# Patient Record
Sex: Female | Born: 1970 | Race: White | Hispanic: No | Smoking: Never smoker
Health system: Southern US, Community
[De-identification: ages and names within clinical notes are randomized; demographics above are authoritative.]

## PROBLEM LIST (undated history)

## (undated) DIAGNOSIS — N801 Endometriosis of ovary: Secondary | ICD-10-CM

## (undated) DIAGNOSIS — Z0000 Encounter for general adult medical examination without abnormal findings: Secondary | ICD-10-CM

## (undated) DIAGNOSIS — S0285XA Fracture of orbit, unspecified, initial encounter for closed fracture: Secondary | ICD-10-CM

## (undated) DIAGNOSIS — M25579 Pain in unspecified ankle and joints of unspecified foot: Secondary | ICD-10-CM

## (undated) DIAGNOSIS — T8859XA Other complications of anesthesia, initial encounter: Secondary | ICD-10-CM

## (undated) DIAGNOSIS — Z9889 Other specified postprocedural states: Secondary | ICD-10-CM

## (undated) DIAGNOSIS — A749 Chlamydial infection, unspecified: Secondary | ICD-10-CM

## (undated) DIAGNOSIS — M549 Dorsalgia, unspecified: Secondary | ICD-10-CM

## (undated) DIAGNOSIS — K5900 Constipation, unspecified: Secondary | ICD-10-CM

## (undated) DIAGNOSIS — R5383 Other fatigue: Secondary | ICD-10-CM

## (undated) DIAGNOSIS — F988 Other specified behavioral and emotional disorders with onset usually occurring in childhood and adolescence: Secondary | ICD-10-CM

## (undated) DIAGNOSIS — A048 Other specified bacterial intestinal infections: Secondary | ICD-10-CM

## (undated) DIAGNOSIS — T7840XA Allergy, unspecified, initial encounter: Secondary | ICD-10-CM

## (undated) DIAGNOSIS — Q244 Congenital subaortic stenosis: Secondary | ICD-10-CM

## (undated) DIAGNOSIS — R5382 Chronic fatigue, unspecified: Secondary | ICD-10-CM

## (undated) DIAGNOSIS — R600 Localized edema: Secondary | ICD-10-CM

## (undated) DIAGNOSIS — G9332 Myalgic encephalomyelitis/chronic fatigue syndrome: Secondary | ICD-10-CM

## (undated) DIAGNOSIS — E739 Lactose intolerance, unspecified: Secondary | ICD-10-CM

## (undated) DIAGNOSIS — C50919 Malignant neoplasm of unspecified site of unspecified female breast: Secondary | ICD-10-CM

## (undated) DIAGNOSIS — T4145XA Adverse effect of unspecified anesthetic, initial encounter: Secondary | ICD-10-CM

## (undated) DIAGNOSIS — R350 Frequency of micturition: Secondary | ICD-10-CM

## (undated) DIAGNOSIS — E785 Hyperlipidemia, unspecified: Secondary | ICD-10-CM

## (undated) DIAGNOSIS — K760 Fatty (change of) liver, not elsewhere classified: Secondary | ICD-10-CM

## (undated) DIAGNOSIS — I351 Nonrheumatic aortic (valve) insufficiency: Secondary | ICD-10-CM

## (undated) DIAGNOSIS — F32A Depression, unspecified: Secondary | ICD-10-CM

## (undated) DIAGNOSIS — N289 Disorder of kidney and ureter, unspecified: Secondary | ICD-10-CM

## (undated) DIAGNOSIS — I493 Ventricular premature depolarization: Secondary | ICD-10-CM

## (undated) DIAGNOSIS — K589 Irritable bowel syndrome without diarrhea: Secondary | ICD-10-CM

## (undated) DIAGNOSIS — M542 Cervicalgia: Secondary | ICD-10-CM

## (undated) DIAGNOSIS — K5909 Other constipation: Secondary | ICD-10-CM

## (undated) DIAGNOSIS — F419 Anxiety disorder, unspecified: Secondary | ICD-10-CM

## (undated) DIAGNOSIS — R112 Nausea with vomiting, unspecified: Secondary | ICD-10-CM

## (undated) DIAGNOSIS — T7492XA Unspecified child maltreatment, confirmed, initial encounter: Secondary | ICD-10-CM

## (undated) DIAGNOSIS — F329 Major depressive disorder, single episode, unspecified: Secondary | ICD-10-CM

## (undated) DIAGNOSIS — K219 Gastro-esophageal reflux disease without esophagitis: Secondary | ICD-10-CM

## (undated) HISTORY — DX: Unspecified child maltreatment, confirmed, initial encounter: T74.92XA

## (undated) HISTORY — DX: Irritable bowel syndrome without diarrhea: K58.9

## (undated) HISTORY — DX: Fatty (change of) liver, not elsewhere classified: K76.0

## (undated) HISTORY — DX: Constipation, unspecified: K59.00

## (undated) HISTORY — DX: Myalgic encephalomyelitis/chronic fatigue syndrome: G93.32

## (undated) HISTORY — DX: Morbid (severe) obesity due to excess calories: E66.01

## (undated) HISTORY — DX: Dorsalgia, unspecified: M54.9

## (undated) HISTORY — DX: Pain in unspecified ankle and joints of unspecified foot: M25.579

## (undated) HISTORY — DX: Hyperlipidemia, unspecified: E78.5

## (undated) HISTORY — DX: Allergy, unspecified, initial encounter: T78.40XA

## (undated) HISTORY — DX: Disorder of kidney and ureter, unspecified: N28.9

## (undated) HISTORY — DX: Endometriosis of ovary: N80.1

## (undated) HISTORY — DX: Lactose intolerance, unspecified: E73.9

## (undated) HISTORY — DX: Frequency of micturition: R35.0

## (undated) HISTORY — DX: Ventricular premature depolarization: I49.3

## (undated) HISTORY — DX: Nonrheumatic aortic (valve) insufficiency: I35.1

## (undated) HISTORY — DX: Localized edema: R60.0

## (undated) HISTORY — DX: Fracture of orbit, unspecified, initial encounter for closed fracture: S02.85XA

## (undated) HISTORY — DX: Cervicalgia: M54.2

## (undated) HISTORY — DX: Gastro-esophageal reflux disease without esophagitis: K21.9

## (undated) HISTORY — DX: Other specified bacterial intestinal infections: A04.8

## (undated) HISTORY — DX: Encounter for general adult medical examination without abnormal findings: Z00.00

## (undated) HISTORY — DX: Congenital subaortic stenosis: Q24.4

## (undated) HISTORY — DX: Chlamydial infection, unspecified: A74.9

## (undated) HISTORY — PX: EXTERNAL EAR SURGERY: SHX627

## (undated) HISTORY — DX: Other specified behavioral and emotional disorders with onset usually occurring in childhood and adolescence: F98.8

## (undated) HISTORY — DX: Chronic fatigue, unspecified: R53.82

## (undated) HISTORY — DX: Malignant neoplasm of unspecified site of unspecified female breast: C50.919

---

## 1999-03-15 HISTORY — PX: SUBAORTIC STENOSIS REPAIR: SHX2450

## 1999-09-23 ENCOUNTER — Ambulatory Visit (HOSPITAL_COMMUNITY): Admission: RE | Admit: 1999-09-23 | Discharge: 1999-09-23 | Payer: Self-pay | Admitting: *Deleted

## 1999-11-23 ENCOUNTER — Encounter: Payer: Self-pay | Admitting: *Deleted

## 1999-11-23 ENCOUNTER — Emergency Department (HOSPITAL_COMMUNITY): Admission: EM | Admit: 1999-11-23 | Discharge: 1999-11-23 | Payer: Self-pay | Admitting: *Deleted

## 2000-08-16 ENCOUNTER — Encounter: Payer: Self-pay | Admitting: Family Medicine

## 2000-08-16 ENCOUNTER — Ambulatory Visit (HOSPITAL_COMMUNITY): Admission: RE | Admit: 2000-08-16 | Discharge: 2000-08-16 | Payer: Self-pay | Admitting: Family Medicine

## 2000-10-18 ENCOUNTER — Other Ambulatory Visit: Admission: RE | Admit: 2000-10-18 | Discharge: 2000-10-18 | Payer: Self-pay | Admitting: Gynecology

## 2001-03-14 HISTORY — PX: APPENDECTOMY: SHX54

## 2001-06-06 ENCOUNTER — Ambulatory Visit (HOSPITAL_COMMUNITY): Admission: RE | Admit: 2001-06-06 | Discharge: 2001-06-06 | Payer: Self-pay | Admitting: Family Medicine

## 2001-06-06 ENCOUNTER — Encounter: Payer: Self-pay | Admitting: Family Medicine

## 2001-10-24 ENCOUNTER — Other Ambulatory Visit: Admission: RE | Admit: 2001-10-24 | Discharge: 2001-10-24 | Payer: Self-pay | Admitting: Gynecology

## 2002-02-18 ENCOUNTER — Emergency Department (HOSPITAL_COMMUNITY): Admission: EM | Admit: 2002-02-18 | Discharge: 2002-02-18 | Payer: Self-pay | Admitting: Emergency Medicine

## 2002-03-14 DIAGNOSIS — A749 Chlamydial infection, unspecified: Secondary | ICD-10-CM

## 2002-03-14 HISTORY — DX: Chlamydial infection, unspecified: A74.9

## 2002-09-06 ENCOUNTER — Encounter: Payer: Self-pay | Admitting: General Surgery

## 2002-09-06 ENCOUNTER — Encounter (INDEPENDENT_AMBULATORY_CARE_PROVIDER_SITE_OTHER): Payer: Self-pay | Admitting: Specialist

## 2002-09-06 ENCOUNTER — Encounter: Payer: Self-pay | Admitting: Emergency Medicine

## 2002-09-06 ENCOUNTER — Inpatient Hospital Stay (HOSPITAL_COMMUNITY): Admission: EM | Admit: 2002-09-06 | Discharge: 2002-09-07 | Payer: Self-pay | Admitting: Emergency Medicine

## 2002-11-06 ENCOUNTER — Other Ambulatory Visit: Admission: RE | Admit: 2002-11-06 | Discharge: 2002-11-06 | Payer: Self-pay | Admitting: Gynecology

## 2003-03-15 HISTORY — PX: BREAST LUMPECTOMY: SHX2

## 2003-06-18 ENCOUNTER — Encounter (INDEPENDENT_AMBULATORY_CARE_PROVIDER_SITE_OTHER): Payer: Self-pay | Admitting: *Deleted

## 2003-06-18 ENCOUNTER — Encounter: Admission: RE | Admit: 2003-06-18 | Discharge: 2003-06-18 | Payer: Self-pay | Admitting: Surgery

## 2003-06-23 ENCOUNTER — Encounter (HOSPITAL_COMMUNITY): Admission: RE | Admit: 2003-06-23 | Discharge: 2003-09-21 | Payer: Self-pay | Admitting: Surgery

## 2003-07-01 ENCOUNTER — Encounter: Admission: RE | Admit: 2003-07-01 | Discharge: 2003-07-01 | Payer: Self-pay | Admitting: Surgery

## 2003-07-02 ENCOUNTER — Encounter (INDEPENDENT_AMBULATORY_CARE_PROVIDER_SITE_OTHER): Payer: Self-pay | Admitting: Specialist

## 2003-07-02 ENCOUNTER — Encounter: Admission: RE | Admit: 2003-07-02 | Discharge: 2003-07-02 | Payer: Self-pay | Admitting: Surgery

## 2003-07-02 ENCOUNTER — Ambulatory Visit (HOSPITAL_BASED_OUTPATIENT_CLINIC_OR_DEPARTMENT_OTHER): Admission: RE | Admit: 2003-07-02 | Discharge: 2003-07-02 | Payer: Self-pay | Admitting: Surgery

## 2003-07-02 ENCOUNTER — Ambulatory Visit (HOSPITAL_COMMUNITY): Admission: RE | Admit: 2003-07-02 | Discharge: 2003-07-02 | Payer: Self-pay | Admitting: Surgery

## 2003-07-28 ENCOUNTER — Ambulatory Visit (HOSPITAL_COMMUNITY): Admission: RE | Admit: 2003-07-28 | Discharge: 2003-07-28 | Payer: Self-pay | Admitting: Oncology

## 2003-07-30 ENCOUNTER — Ambulatory Visit (HOSPITAL_COMMUNITY): Admission: RE | Admit: 2003-07-30 | Discharge: 2003-07-30 | Payer: Self-pay | Admitting: Oncology

## 2003-08-04 ENCOUNTER — Ambulatory Visit (HOSPITAL_COMMUNITY): Admission: RE | Admit: 2003-08-04 | Discharge: 2003-08-04 | Payer: Self-pay | Admitting: Oncology

## 2003-08-05 ENCOUNTER — Ambulatory Visit: Admission: RE | Admit: 2003-08-05 | Discharge: 2003-09-24 | Payer: Self-pay | Admitting: *Deleted

## 2003-08-07 ENCOUNTER — Ambulatory Visit (HOSPITAL_BASED_OUTPATIENT_CLINIC_OR_DEPARTMENT_OTHER): Admission: RE | Admit: 2003-08-07 | Discharge: 2003-08-07 | Payer: Self-pay | Admitting: Surgery

## 2003-08-07 ENCOUNTER — Encounter (INDEPENDENT_AMBULATORY_CARE_PROVIDER_SITE_OTHER): Payer: Self-pay | Admitting: Specialist

## 2003-08-07 ENCOUNTER — Ambulatory Visit (HOSPITAL_COMMUNITY): Admission: RE | Admit: 2003-08-07 | Discharge: 2003-08-07 | Payer: Self-pay | Admitting: Surgery

## 2003-11-26 ENCOUNTER — Ambulatory Visit (HOSPITAL_COMMUNITY): Admission: RE | Admit: 2003-11-26 | Discharge: 2003-11-26 | Payer: Self-pay | Admitting: Oncology

## 2004-01-06 ENCOUNTER — Ambulatory Visit: Admission: RE | Admit: 2004-01-06 | Discharge: 2004-03-19 | Payer: Self-pay | Admitting: *Deleted

## 2004-01-10 ENCOUNTER — Emergency Department (HOSPITAL_COMMUNITY): Admission: EM | Admit: 2004-01-10 | Discharge: 2004-01-10 | Payer: Self-pay | Admitting: *Deleted

## 2004-03-10 ENCOUNTER — Ambulatory Visit: Payer: Self-pay | Admitting: Oncology

## 2004-03-14 HISTORY — PX: SALPINGOOPHORECTOMY: SHX82

## 2004-03-14 HISTORY — PX: CENTRAL VENOUS CATHETER INSERTION: SHX401

## 2004-03-31 ENCOUNTER — Ambulatory Visit (HOSPITAL_BASED_OUTPATIENT_CLINIC_OR_DEPARTMENT_OTHER): Admission: RE | Admit: 2004-03-31 | Discharge: 2004-03-31 | Payer: Self-pay | Admitting: Surgery

## 2004-04-07 ENCOUNTER — Ambulatory Visit: Admission: RE | Admit: 2004-04-07 | Discharge: 2004-04-07 | Payer: Self-pay | Admitting: *Deleted

## 2004-04-07 ENCOUNTER — Other Ambulatory Visit: Admission: RE | Admit: 2004-04-07 | Discharge: 2004-04-07 | Payer: Self-pay | Admitting: Gynecology

## 2004-06-01 ENCOUNTER — Ambulatory Visit: Payer: Self-pay | Admitting: Oncology

## 2004-06-23 ENCOUNTER — Encounter: Admission: RE | Admit: 2004-06-23 | Discharge: 2004-06-23 | Payer: Self-pay | Admitting: Oncology

## 2004-08-31 ENCOUNTER — Ambulatory Visit: Payer: Self-pay | Admitting: Oncology

## 2004-09-06 ENCOUNTER — Ambulatory Visit (HOSPITAL_COMMUNITY): Admission: RE | Admit: 2004-09-06 | Discharge: 2004-09-06 | Payer: Self-pay | Admitting: Gynecology

## 2004-09-06 ENCOUNTER — Ambulatory Visit (HOSPITAL_BASED_OUTPATIENT_CLINIC_OR_DEPARTMENT_OTHER): Admission: RE | Admit: 2004-09-06 | Discharge: 2004-09-06 | Payer: Self-pay | Admitting: Gynecology

## 2004-09-06 ENCOUNTER — Encounter (INDEPENDENT_AMBULATORY_CARE_PROVIDER_SITE_OTHER): Payer: Self-pay | Admitting: *Deleted

## 2004-12-07 ENCOUNTER — Ambulatory Visit: Payer: Self-pay | Admitting: Oncology

## 2005-04-05 ENCOUNTER — Ambulatory Visit: Payer: Self-pay | Admitting: Oncology

## 2005-07-06 ENCOUNTER — Encounter: Admission: RE | Admit: 2005-07-06 | Discharge: 2005-07-06 | Payer: Self-pay | Admitting: Oncology

## 2005-07-09 ENCOUNTER — Encounter: Admission: RE | Admit: 2005-07-09 | Discharge: 2005-07-09 | Payer: Self-pay | Admitting: Oncology

## 2005-08-01 ENCOUNTER — Ambulatory Visit: Payer: Self-pay | Admitting: Oncology

## 2005-09-21 ENCOUNTER — Other Ambulatory Visit: Admission: RE | Admit: 2005-09-21 | Discharge: 2005-09-21 | Payer: Self-pay | Admitting: Gynecology

## 2006-07-12 ENCOUNTER — Encounter: Admission: RE | Admit: 2006-07-12 | Discharge: 2006-07-12 | Payer: Self-pay | Admitting: Oncology

## 2007-04-18 ENCOUNTER — Other Ambulatory Visit: Admission: RE | Admit: 2007-04-18 | Discharge: 2007-04-18 | Payer: Self-pay | Admitting: Obstetrics and Gynecology

## 2007-06-25 ENCOUNTER — Emergency Department (HOSPITAL_COMMUNITY): Admission: EM | Admit: 2007-06-25 | Discharge: 2007-06-25 | Payer: Self-pay | Admitting: Emergency Medicine

## 2009-11-11 ENCOUNTER — Emergency Department (HOSPITAL_COMMUNITY): Admission: EM | Admit: 2009-11-11 | Discharge: 2009-11-11 | Payer: Self-pay | Admitting: Emergency Medicine

## 2010-05-28 LAB — BASIC METABOLIC PANEL
BUN: 11 mg/dL (ref 6–23)
GFR calc Af Amer: >60 mL/min (ref >60)
GFR calc non Af Amer: >60 mL/min (ref >60)
Potassium: 3.7 mEq/L (ref 3.5–5.1)
Sodium: 138 mEq/L (ref 135–145)

## 2010-05-28 LAB — CBC
HCT: 39.7 % (ref 36.0–46.0)
MCH: 33.6 pg (ref 26.0–34.0)
MCHC: 34.2 g/dL (ref 30.0–36.0)
Platelets: 233 10*3/uL (ref 150–400)
RDW: 12.2 % (ref 11.5–15.5)

## 2010-05-28 LAB — DIFFERENTIAL
Eosinophils Relative: 2 % (ref 0–5)
Lymphocytes Relative: 29 % (ref 12–46)
Neutro Abs: 4.3 10*3/uL (ref 1.7–7.7)
Neutrophils Relative %: 62 % (ref 43–77)

## 2010-05-28 LAB — POCT CARDIAC MARKERS

## 2010-07-23 ENCOUNTER — Encounter: Payer: Self-pay | Admitting: Women's Health

## 2010-07-26 ENCOUNTER — Other Ambulatory Visit: Payer: Self-pay | Admitting: Women's Health

## 2010-07-26 ENCOUNTER — Other Ambulatory Visit (HOSPITAL_COMMUNITY)
Admission: RE | Admit: 2010-07-26 | Discharge: 2010-07-26 | Disposition: A | Discharge status: Home / Self Care | Payer: 59 | Source: Ambulatory Visit | Attending: Gynecology | Admitting: Gynecology

## 2010-07-26 ENCOUNTER — Encounter (INDEPENDENT_AMBULATORY_CARE_PROVIDER_SITE_OTHER): Payer: 59 | Admitting: Women's Health

## 2010-07-26 DIAGNOSIS — R82998 Other abnormal findings in urine: Secondary | ICD-10-CM

## 2010-07-26 DIAGNOSIS — Z124 Encounter for screening for malignant neoplasm of cervix: Secondary | ICD-10-CM | POA: Insufficient documentation

## 2010-07-26 DIAGNOSIS — Z01419 Encounter for gynecological examination (general) (routine) without abnormal findings: Secondary | ICD-10-CM

## 2010-07-26 DIAGNOSIS — Z113 Encounter for screening for infections with a predominantly sexual mode of transmission: Secondary | ICD-10-CM

## 2010-07-26 DIAGNOSIS — B373 Candidiasis of vulva and vagina: Secondary | ICD-10-CM

## 2010-07-30 NOTE — Op Note (Signed)
[name], [name]             ACCOUNT NO.:  [id number]   MEDICAL RECORD NO.:  [id number]          PATIENT TYPE:  AMB   LOCATION:  NESC                         FACILITY:  [facility]   PHYSICIAN:  Ivor Costa. Farrel Gobble, M.D. DATE OF BIRTH:  06/07/1970   DATE OF PROCEDURE:  09/06/2004  DATE OF DISCHARGE:                                 OPERATIVE REPORT   PREOPERATIVE DIAGNOSES:  1.  Complex adnexal mass.  2.  History of breast cancer.   POSTOPERATIVE DIAGNOSES:  1.  Complex adnexal mass.  2.  History of breast cancer.   PROCEDURE:  1.  Laparoscopic left salpingo-oophorectomy.  2.  Pelvic washings.   SURGEON:  Dr. Farrel Gobble   ASSISTANT:  Dr. Eda Paschal   ANESTHESIA:  General.   ESTIMATED BLOOD LOSS:  Minimal.   FINDINGS:  Grossly enlarged left ovary without any serosal excrescences.  Normal left tube right adnexa, uterus, and upper abdomen.   COMPLICATIONS:  None.   PATHOLOGY:  Left tube and ovary and pelvic washings.   DESCRIPTION OF PROCEDURE:  The patient was taken to the operating room;  general anesthesia was induced, placed in the dorsolithotomy position and  prepped and draped in the usual sterile fashion.  A bivalve speculum was  placed in the vagina, and cervix was visualized, and the uterine manipulator  was placed, after which an infraumbilical incision was made in the abdomen,  and the Veress needle was inserted.  Opening pressure was 2, and  pneumoperitoneum was then created until tympany was appreciated above the  liver, after which a #10-11 disposable trocar was inserted through the  infraumbilical port.  Placement in the abdomen was confirmed.  Abdominal  findings and pelvic findings are as mentioned above.  Two lower ports were  then made under direct visualization.  A 5 mm port in the left lower  quadrant and a 10 mm port in the right lower quadrant.  Using these two  ports, the pelvis was inspected and washings were obtained.  The tube and  ovary were  elevated.  Infundibulopelvic ligament was identified.  The ureter  was noted to be inferior to this point.  It was then treated with cautery  and transected.  The transection carried through encompassing the broad  ligament and ultimately up to the uterus where the tuboovarian ligament,  tube, and round ligaments were all systematically transected after cautery.  An Endopouch was then placed through the lower 10 mm port, and the specimen  was placed intact within this port.  The bag was brought to the skin level.  The skin and fascia were then extended sharply, and the specimen was removed  intact and then passed off of the table.  A pneumoperitoneum was then  recreated in the pelvis.  Inspection of the operative site assured [name] of  hemostasis.  The ports were removed under direct visualization.  The right  lower port fascia was closed with a running layer of 0 Vicryl.  The skin was  closed with 4-0 plain.  Similarly, the infraumbilical port was closed with 0  Vicryl and 0 plain at the skin.  All three ports were injected with a total  of 10 mL of 0.25% Marcaine.  Steri-Strips were placed.  The patient  tolerated the procedure well.  Sponge, lap, and needle counts were correct x  2, and she was transferred to the PACU in stable condition.       THL/MEDQ  D:  09/06/2004  T:  09/06/2004  Job:  [id number]

## 2010-07-30 NOTE — H&P (Signed)
[name], [name]             ACCOUNT NO.:  [id number]   MEDICAL RECORD NO.:  [id number]          PATIENT TYPE:  AMB   LOCATION:  NESC                         FACILITY:  [facility]   PHYSICIAN:  Ivor Costa. Farrel Gobble, M.D. DATE OF BIRTH:  [birth year]   DATE OF ADMISSION:  DATE OF DISCHARGE:                                HISTORY & PHYSICAL   CHIEF COMPLAINT:  Complex adnexal mass.   HISTORY OF PRESENT ILLNESS:  The patient is a 40 year old G0 with a personal  history of breast cancer who has completed her course of chemo and radiation  which rendered her amenorrheic with an elevated FSH. The patient had  remained amenorrheic for a number of months and presented to our office  complaining of pelvic pressure in May 2006. At the point when she returned,  she had begun having cycles again, these being her first since her cancer  was diagnosed in April 2004. The patient had an ultrasound done at that time  which showed a normal uterus, trilayered endometrium, a normal right adnexa.  The left ovary was significant for a complex mass with thin septations and a  cystic solid hemorrhagic area. There were also noted to be solid  excrescences in the adnexa. Her resistance index was normal at 0.49. Her CA-  125 was also normal. The patient was asked to return back to the office in 6  weeks, at which point a repeat scan was to be performed. Her scan shows that  the cystic solid lesion is larger than it had been before with multiple  thick septations and the mass measures 4.5 x 3 x 3. Again, no fluid was seen  in the cul-de-sac. Because of the persistence of the complex adnexal mass,  as well as her personal history of cancer, the patient now presents for a  laparoscopic LSO with pelvic washings.   PAST OBSTETRICAL AND GYNECOLOGICAL HISTORY:  Is as mentioned above. Prior to  radiation, she did have monthly cycles.  Normal PAP's   PAST MEDICAL HISTORY:  Significant for breast cancer, as well as some  heart  disease.   PAST SURGICAL HISTORY:  The patient had some cardiac surgery in 2001 in  [place]. She had oral surgery in 1999 and 1992. She had a lumpectomy in 2005.   MEDICATIONS:  Wellbutrin XL 150, as well as some over-the-counter vitamins.   SOCIAL HISTORY:  She is married. No tobacco or caffeine. Some alcohol.   FAMILY HISTORY:  Negative for gynecologic cancers.   PHYSICAL EXAMINATION:  GENERAL:  She is well-appearing, in no acute  distress.  HEART:  Regular rate.  LUNGS:  Clear to auscultation.  ABDOMEN:  Soft and nontender.  GYNECOLOGIC:  She has normal external female genitalia. The BUS is negative.  The vagina is pink and moist. The cervix is without gross lesions. On  bimanual exam, the uterus is mobile and nontender, as are the adnexa.  Rectovaginal exam was deferred.   ASSESSMENT:  Complex adnexal mass in a premenopausal patient with a personal  history of breast cancer. The patient will present for laparoscopic washings  and LSO.  Risks and benefits of the procedure were discussed. The possibility  that this could be an early stage cancer in which case the patient may  require reoperation was also discussed and acceptable to the patient. She  was given a prescription for postoperative pain at the time of her preop.       THL/MEDQ  D:  09/03/2004  T:  09/03/2004  Job:  [id number]

## 2010-07-30 NOTE — Op Note (Signed)
[name], [name]             ACCOUNT NO.:  [id number]   MEDICAL RECORD NO.:  [id number]          PATIENT TYPE:  AMB   LOCATION:  DSC                          FACILITY:  [facility]   PHYSICIAN:  Currie Paris, M.D.DATE OF BIRTH:  [birth year]   DATE OF PROCEDURE:  03/31/2004  DATE OF DISCHARGE:                                 OPERATIVE REPORT   PREOPERATIVE DIAGNOSIS:  Unneeded Port-A-Cath.   POSTOPERATIVE DIAGNOSIS:  Unneeded Port-A-Cath.   OPERATION:  Removal of Port-A-Cath.   SURGEON:  Currie Paris, M.D.   ANESTHESIA:  Local.   CLINICAL HISTORY:  This patient has completed her chemotherapy and her  radiation for her breast cancer and wished to have her Port-A-Cath removed.   DESCRIPTION OF PROCEDURE:  In the minor procedure room, the patient  confirmed the planned procedure. The area of the Port-A-Cath was prepped  with some alcohol and anesthetized with about 18 cc of 1% Xylocaine with  epinephrine.   After a 10-minute wait, the area was prepped and draped. The old scar was  opened at the very bottom edge of the scar and the Port-A-Cath itself  identified. The tubing was backed out of the track, and there was no back  bleeding. The holding sutures were cut and the port removed. A couple of  small bleeding points were sutured ligated with 3-0 Vicryl and the incision  closed with 3-0 Vicryl and the incision closed with 3-0 Vicryl and 4-0  Monocryl subcuticular plus Dermabond. The patient tolerated the procedure  well.      Chri   CJS/MEDQ  D:  03/31/2004  T:  03/31/2004  Job:  [id number]

## 2010-07-30 NOTE — Procedures (Signed)
[facility]  Patient:    [name], [name]                           MRN: [id number] Proc. Date: 09/28/99 Adm. Date:  [id number] Disc. Date: [id number] Attending:  Meade Maw A                           Procedure Report  PROCEDURE PERFORMED:  Transesophageal echocardiogram.  CARDIOLOGIST:  Meade Maw, M.D.  INDICATIONS:  Subvalvular velocities of 5.0 cm per second on transthoracic, with a questionable subaortic membrane.  DESCRIPTION OF PROCEDURE:  After obtaining a written informed consent, the patient was brought to the endoscopy laboratory in the postabsorptive state. Preoperative sedation was achieved using IV Versed.  Topical anesthesia was achieved using cetacaine spray and viscus lidocaine.  Following appropriate sedation, an Omniplane probe was introduced using digital guidance, without difficulty.  Multiple views were obtained at the midesophageal, deep basal, and deep gastric views.  Color low Doppler was performed across the mitral, aortic, and tricuspid valve.  A bubble  study was performed.  The ascending, descending aortic arch were visualized.  FINDINGS:  There is a discrete membranous ridge just below the aortic valve. This creates a velocity of 6.0 cm per second.  The aortic valve morphology is grossly normal.  There is mild aortic sclerosis associated with mild aortic insufficiency. The mitral valve is normal in morphology.  There is mild mitral regurgitation noted.  The tricuspid valve is grossly normal.  The pulmonic valve is grossly normal.  The ascending aorta is grossly normal.  The aortic arch is grossly normal. The descending aorta is grossly normal.  There is marked concentric hypertrophy  with cavity obliteration during systole.  Peak velocity recorded in the LV chamber is 2.0 cm per second.  The left atrium is of normal size.  The left ventricle in end diastole is measured at 44.0 mm on transthoracic  echocardiogram.  The left ventricle in end systole on transthoracic echocardiogram was measured at 20.0 mm. The intra-atrial septum was intact, as demonstrated by the bubble study.  The left atrial appendage appeared within normal limits.  FINAL DIAGNOSES: 1. 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 2. Left ventricle has severe concentric hypertrophy, with outflow gradient    of 16 mmHg. 3. Aortic valve is grossly normal.  Mild aortic sclerosis with mild aortic    insufficiency is demonstrated. 4. The ascending and aortic arch is grossly normal.  RECOMMENDATIONS:  These findings will be discussed with the patient.  Beta blockers will gradually be withdrawn.  A surgical consultation will be obtained. DD:  09/28/99 TD:  09/28/99 Job: [id number]

## 2010-07-30 NOTE — Op Note (Signed)
[name], [name]                          ACCOUNT NO.:  [id number]   MEDICAL RECORD NO.:  [id number]                   PATIENT TYPE:  INP   LOCATION:  1823                                 FACILITY:  [facility]   PHYSICIAN:  Jimmye Norman III, M.D.               DATE OF BIRTH:  June 16, 1970   DATE OF PROCEDURE:  09/06/2002  DATE OF DISCHARGE:                                 OPERATIVE REPORT   PREOPERATIVE DIAGNOSIS:  Acute appendicitis.   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   PROCEDURE:  Difficult laparoscopic appendectomy.   SURGEON:  Jimmye Norman, M.D.   ASSISTANT:  None.   ANESTHESIA:  General endotracheal.   ESTIMATED BLOOD LOSS:  75 mL   COMPLICATIONS:  None.   CONDITION:  Stable.   INDICATIONS FOR PROCEDURE:  The patient is a 40 year old with a malrotation  of the gut and acute appendicitis by CT scan who comes in for a laparoscopic  appendectomy.   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 Her appendix which was  inflamed was in the midline and extended towards the left lower quadrant.  There was no evidence of perforation.   SPECIMENS:  Appendix.   DESCRIPTION OF PROCEDURE:  The patient was taken to the operating room,  placed on the table in the supine position. After an adequate endotracheal  anesthetic was administered, she was prepped and draped in the usual sterile  manner exposing the midline and both lower quadrants and upper quadrants. A  supraumbilical curvilinear incision was made using a #11 blade and taken  down to the midline fascia through which a varus needle was subsequently  passed into the peritoneal cavity while tenting up on the anterior abdominal  wall with sharp towel clips. Once we had confirmed placement of the varus  needle with the saline test, carbon dioxide insufflation was instilled into  the peritoneal cavity up  to a maximum pressure of 15 mmHg. We subsequently  passed a suprapubic cannula into the peritoneal cavity under direct vision  and angled it towards her left lower quadrant. We could visualize the  inflamed appendix right in the midline of the wound just below where a  cannula was placed. We placed a third trocar in the left upper quadrant that  exposed our usual position in the right lower quadrant in order to get  adequate mobilization of the appendix. Once we had all cannulas in place,  the patient was placed in steep Trendelenburg and the right side tilted  down. We grasped the inflamed appendix and subsequently used instruments to  dissect out the appendix and at its base down to the base of the cecum. Once  we had isolated and prepared a window between the mesoappendix and the base  of the appendix, endoGIA with 3.5  mm blue staplers were passed across the  base and then I subsequently transected the appendix. We then passed a 2.5  mm vascular endoGIA across the mesoappendix, fired it where there was some  subsequent bleeding which was controlled just with time and irrigation. We  removed the appendix from the suprapubic site using an EndoCatch bag without  contamination. There was no question this was the cause of the patient's  discomfort and elevated white count.   We irrigated with almost 2 liters of warm saline solution with the last bag  demonstrating no evidence of swirling blood loss. Once we had adequate  control, we removed all cannulas.   The supraumbilical site was closed using a figure-of-eight suture of #0  Vicryl. A 0.25% Marcaine with epinephrine was injected at all sites. Sterile  dressings were applied to all wounds. All sponge, needle and instrument  counts were correct. The patient was taken to the recovery room in stable  condition.                                               Kathrin Ruddy, M.D.    JW/MEDQ  D:  09/06/2002  T:  09/07/2002  Job:   [id number]

## 2010-07-30 NOTE — H&P (Signed)
[name] [name] [facility]                          ACCOUNT NO.:  [id number]   MEDICAL RECORD NO.:  [id number]                   PATIENT TYPE:  INP   LOCATION:  5727                                 FACILITY:  [facility]   PHYSICIAN:  Jimmye Norman III, M.D.               DATE OF BIRTH:  02/20/1971   DATE OF ADMISSION:  09/05/2002  DATE OF DISCHARGE:                                HISTORY & PHYSICAL   IDENTIFICATION AND CHIEF COMPLAINT:  The patient is a 40 year old female  diagnosed with acute appendicitis by CT scan who comes in for laparoscopic  appendectomy.   HISTORY OF PRESENT ILLNESS:  The patient has been sick since about 3 o'clock  to 3:30 afternoon when she came home from work with abdominal pain, nausea,  vomiting and subsequently having fevers up to 101.5.  She had multiple  episodes of vomiting.  Came into the emergency room when her symptoms did  not abate.  Has been there since 7 o'clock yesterday evening.  By a lengthy  workup led to the diagnosis of acute appendicitis by CT scan and surgical  consultation was obtained.   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  Up until that time, she had had significant  hypertension.  She is currently on no medications for that.  Other medical  history is unremarkable except for mild depression.   MEDICATIONS:  Wellbutrin only.   ALLERGIES:  She has no known drug allergies.   REVIEW OF SYSTEMS:  She has had no diarrhea or constipation.  The pain has  been only in the right lower quadrant.  Has not migrated to that area from  any other site.   PHYSICAL EXAMINATION:  She had a slight temperature of 99.1 on admission.  She is afebrile now.  Her other vital signs are stable.  She is  normocephalic and atraumatic.  She has no scleral icterus.  The neck is  supple with no palpable masses.  No bruits.  She has no  palpable thyroid  masses.  Her lungs are clear to auscultation.  Cardiac exam, she has a grade  3/6 murmur at the left lower sternal border which does radiate towards the  apex.  No lifts or heaves.  Her abdomen has a small moon-like tattoo on the  right lower quadrant.  She has tenderness in the right lower quadrant.  Hypoactive but present bowel sounds.  Scarring on the supraumbilical area  from her previous naval ring.  Rectal and pelvic exams were deferred.   LABORATORY DATA:  Demonstrate a white blood cell count of 15.7 thousand with  a left shift.  She has a normal hemoglobin.  A UA suggested UTI with  leukocyte esterase positive, many bacteria and 21-50 white cells.  It is  nitrite negative.  CT was reviewed with the radiologist.  It shows that the patient not only  has acute appendicitis but also has malrotation of the gut, the __________  small bowel being towards the right lower quadrant on the right side and  obvious large bowel on the left side.  However, her cecum does loop back  over towards the right and her appendix ends up being back on the right side  more anterior.   IMPRESSION:  1. Acute appendicitis in a woman with malrotation of the gut.  2. History of idiopathic hypertrophic subaortic stenosis.   DISPOSITION:  EKG and chest x-ray will be done preoperatively.  Both had  been done and reviewed and demonstrated no abnormalities significant for  significant heart disease.   She requires an appendectomy and a laparoscopic approach will be the best in  her case.  We will go ahead and perform the procedure as soon as possible  after patient gets preoperative antibiotics.   I have discussed the procedure with the patient who understands the risks  and benefits and wishes to proceed.  There are no family members around.                                                Kathrin Ruddy, M.D.    JW/MEDQ  D:  09/06/2002  T:  09/07/2002  Job:  [id number]

## 2010-07-30 NOTE — Op Note (Signed)
[name], [name]                       ACCOUNT NO.:  [id number]   MEDICAL RECORD NO.:  [id number]                   PATIENT TYPE:  AMB   LOCATION:  DSC                                  FACILITY:  [facility]   PHYSICIAN:  Currie Paris, M.D.           DATE OF BIRTH:  10-Jul-1970   DATE OF PROCEDURE:  08/07/2003  DATE OF DISCHARGE:                                 OPERATIVE REPORT   CCS [id number]   PREOPERATIVE DIAGNOSIS:  1. Carcinoma, left breast, lower inner quadrant.  2. Inadequate venous access for chemotherapy.   POSTOPERATIVE DIAGNOSIS:  1. Carcinoma, left breast, lower inner quadrant.  2. Inadequate venous access for chemotherapy.   OPERATION PERFORMED:  1. Placement of a Port-A-Cath.  2. Re-excision of left breast lumpectomy site.   SURGEON:  Currie Paris, M.D.   ANESTHESIA:  General.   INDICATIONS FOR PROCEDURE:  The patient is a 40 year old who had previously  undergone excision of an invasive breast cancer of the lower inner quadrant.  All the margins were negative.  The medial margin was about 2 mm and  radiation therapy had suggested perhaps getting a slightly larger margin  here since the patient was young and had an aggressive-looking tumor.  This  was discussed with her by Dr. Dorna Bloom and then also today by me and she wished to  go ahead with that, so we added that on.  In addition, she needed a Port-A-  Cath for intravenous access and I went over the indications, risks and  complications again with her and she was willing to proceed with that.   DESCRIPTION OF PROCEDURE:  The patient was seen in the holding area and all  questions were reviewed again.  She was then taken to the operating room and  given general anesthesia.  The lower neck, chest, breast area were prepped  and draped as a single sterile field.  She was placed in Trendelenburg.  I  used some Marcaine to help with postoperative analgesia for each incision.  I put some under the right  clavicular area using the needle with the Port-A-  Cath kit, entered the subclavian vein on the initial attempt and the  guidewire was threaded easily and fluoroscopy showed it to be in the  superior vena cava/right atrial area.   Additional Marcaine was infiltrated for a skin incision and a transverse  incision was made and using cautery, a pocket fashioned for the reservoir.  A tunnel was made from that site into the guidewire site.  The dilator and  peel-away sheath were then threaded over the guidewire and that went easily.  The dilator and guidewire were removed and a catheter threaded through the  peel-away sheath which was then removed. The guidewire went to about 25 cm  and aspirated and irrigated easily.  Using fluoroscopy we were in the right  atrium and the catheter was backed up until it appeared to be in  the lower  superior vena cava.  It aspirated and irrigated easily.  The reservoir was  flushed, attached and the locking mechanism engaged.  The reservoir itself  was sutured to the fascia with some Prolene.  The incision was closed with 3-  0 Vicryl followed by 4-0 Monocryl subcuticular.  Final fluoroscopy showed  what appeared to be good positioning and no kinking.   Attention was then turned to the left breast.  The old scar was incised with  a little bit of the __________ area excised.  I excised a little bit of the  lateral margin and then all of the medial margin entering the seroma cavity  and excising the entire medial half of the seroma cavity so that we got  fresh new margin.  This went from basically skin down to muscle.   All bleeding was controlled with cautery.  Once this was achieved, I closed  the subcu with some 3-0 Vicryl, the skin with 4-0 Monocryl subcuticular.  Dermabond was applied to all incisions.  The patient tolerated the procedure  well.  There were no operative complications.  All counts were correct.  Prior to closing, the port was flushed with  concentrated aqueous heparin.                                               Currie Paris, M.D.    CJS/MEDQ  D:  08/07/2003  T:  08/07/2003  Job:  [id number]   cc:   Elmer Sow. Dorna Bloom, M.D.  [address] [facility]  [place]  [place] [address]  Fax: [phone]   Pierce Crane, M.D.  [address] [facility]  [place]  [place] [address]  Fax: [phone]

## 2010-07-30 NOTE — Op Note (Signed)
NAME:  [name], [name]                       ACCOUNT NO.:  [id number]   MEDICAL RECORD NO.:  [id number]                   PATIENT TYPE:  AMB   LOCATION:  DSC                                  FACILITY:  [facility]   PHYSICIAN:  Currie Paris, M.D.           DATE OF BIRTH:  [birth year]   DATE OF PROCEDURE:  07/02/2003  DATE OF DISCHARGE:                                 OPERATIVE REPORT   Office Medical Record Number:  [id number]   PREOPERATIVE DIAGNOSIS:  Left breast cancer.   POSTOPERATIVE DIAGNOSIS:  Left breast cancer, lower inner quadrant.   PROCEDURE:  Needle guided excision of left breast cancer with blue dye  injection and sentinel node biopsy (three nodes).   SURGEON:  Currie Paris, M.D.   ANESTHESIA:  General.   INDICATIONS FOR PROCEDURE:  This patient is a 40 year old who presented with  a mass in the left breast at about the 6 o'clock position.  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  MRI's were otherwise unremarkable.  Guide wire  had been placed preoperatively.   DESCRIPTION OF PROCEDURE:  The patient was seen in the holding area and had  no further questions.  The left breast had the guide wire placed in it, and  it was marked as well by me as the operative side.  The patient was taken to  the operating room and after satisfactory general anesthesia had been  obtained, I injected 4 mL of methylene blue diluted dye subareolarly.  I  used ultrasound to confirm the location of the mass and it was really at  about the 7 o'clock position.   The breast was then prepped and draped.  I made a radial incision in the  lower inner quadrant of the breast just at about the 7 o'clock position  directly over the mass.  I took an ellipse of skin and then went down medial  to the mass which was readily palpable once the skin incision was made and  divided the breast tissue down to the chest wall.  I then came around a  little bit inferiorly and then a little bit  superiorly and then under the  mass as close to the chest wall as I could get, although, there was a little  tissue left behind on the chest wall initially.  At this point, I was then  around it by three sides.  The guide wire had entered laterally and with  some traction on the specimen, I was able to divide the remaining  attachments which were along the lateral aspect and manipulated the guide  wire into the incision.   Palpation of the margins of the specimen appeared all negative, although,  the closest appeared to be deep.   I have sent this for touch preps and I did not think a specimen mammogram  was necessary because of the obvious nature of the lesion here.   I went  ahead and took the remaining deep margin down to the chest wall  including fascia and a little bit of the lateral margin as well along the  area where the tumor had been close.   Bleeders were controlled with cautery.  I injected some Marcaine to help  with postoperative analgesia.  Once everything appeared to be dry, I left  the pack in place.   Attention was turned to the axilla.  The Neoprobe was used and a hot area  identified.  A short transverse incision was made.  Subcutaneous tissues  divided and as I get into the axillary fat, I saw a tiny blue lymphatic.  Using the Neoprobe, there was no activity on the breast side of this  lymphatic, but there was on the axillary side.  Brief dissection showed blue  node and this was dissected out using cautery.  As I was tugging up on this,  a second adjacent blue node was identified and held initially with a  hemostat.  Once the first node was excised, I checked its counts and it had  counts up to 2300.   With a little gentle traction on the second blue node, it was also dissected  out and in doing so I saw a third blue node which was also grasped initially  with a hemostat.  The second node had counts up to 1800 and the third blue  node was also removed and had  counts of about 800.   With these three nodes out, there are no counts higher than about 40 to 50  in the background and even less most areas.  There is no palpable adenopathy  noted and no blue nodes noted.   This completed the axillary biopsy.   While waiting for pathology, I went ahead and irrigated the breast site.  It  had remained absolutely dry while we were doing the axillary portion of the  case.  I closed some of the deeper breast tissue to cover the muscle.  Then  the subcu and then the skin.  3-0 Vicryl was used for deeper layers and 4-0  Monocryl subcuticular on the skin.  I returned my attention back to the  axillary site and it was closed in a similar fashion with 3-0 Vicryl and 4-0  Monocryl.  I had also injected some Marcaine here prior to closing.  Again,  while we had closed the breast site, the packing had been left in the  axillary and it was dry when we went back to close it.   Dr. Almyra Free from pathology reported that the margins were negative on the  initial excision of the tumor.  Subsequently, she reported that the three  nodes were negative.  Dermabond was applied.  Sterile dressings were placed.  The patient tolerated the procedure well.  There were no operative  complications.  All needle, sponge, and instrument counts correct.                                               Currie Paris, M.D.    CJS/MEDQ  D:  07/02/2003  T:  07/03/2003  Job:  [id number]   cc:   Dr. Brendia Sacks, M.D.  [address]  [place]  [place] [address]  Fax: [phone]

## 2010-10-18 ENCOUNTER — Encounter: Payer: Self-pay | Admitting: Women's Health

## 2010-10-18 ENCOUNTER — Ambulatory Visit (INDEPENDENT_AMBULATORY_CARE_PROVIDER_SITE_OTHER): Payer: 59 | Admitting: Women's Health

## 2010-10-18 ENCOUNTER — Other Ambulatory Visit: Payer: Self-pay

## 2010-10-18 ENCOUNTER — Other Ambulatory Visit: Payer: 59

## 2010-10-18 VITALS — BP 124/70

## 2010-10-18 DIAGNOSIS — N946 Dysmenorrhea, unspecified: Secondary | ICD-10-CM

## 2010-10-18 DIAGNOSIS — N8000 Endometriosis of the uterus, unspecified: Secondary | ICD-10-CM

## 2010-10-18 DIAGNOSIS — Z853 Personal history of malignant neoplasm of breast: Secondary | ICD-10-CM

## 2010-10-18 DIAGNOSIS — N926 Irregular menstruation, unspecified: Secondary | ICD-10-CM

## 2010-10-18 MED ORDER — IBUPROFEN 600 MG PO TABS: 600.0000 mg | ORAL_TABLET | Freq: Three times a day (TID) | ORAL | Status: DC | PRN

## 2010-10-18 NOTE — Progress Notes (Signed)
  Presents for an ultrasound. She was in our office for an annual in May, having a light 6-7 days cycle mostly monthly. She has a history of breast cancer  05 and stopped having a cycle after that for several years and then started cycling again in 2009. Today her uterus endometrium is 2.4 mm which did review is thin, no need to withdrawal at this time. Reviewed if cycle greater than 60 days apart to call office. She is also having increased dysmenorrhea with her cycles, which she did not have in the past. Did review uterus does appear to have tiny cortical cystic areas that are consistent with adenomyosis. Did review could not use birth control pills to help with the dysmenorrhea due to the breast cancer history. Will try some Motrin. Did review IUDs do not always relieve this type of pain. Will return to the office if symptoms worsen and has no relief with motrin. Did review briefly hysterectomy as an option for the dysmenorrhea if no relief with Motrin. If she so chooses that option will schedule an appointment with Dr. Eda Paschal to discuss.  She has a 1-2 cm mobile nontender cystic area on her right flank, states it's been there at least 1-2 years. She does not believe it has increased in size. She will schedule appointment with Dr. Jamey Ripa, who she has seen in the past to have removed. Offered to schedule an appointment, but she states she will call to schedule.

## 2010-10-19 ENCOUNTER — Emergency Department (HOSPITAL_COMMUNITY)
Admission: EM | Admit: 2010-10-19 | Discharge: 2010-10-20 | Disposition: A | Discharge status: Home / Self Care | Payer: 59 | Attending: Emergency Medicine | Admitting: Emergency Medicine

## 2010-10-19 DIAGNOSIS — Z23 Encounter for immunization: Secondary | ICD-10-CM | POA: Insufficient documentation

## 2010-10-19 DIAGNOSIS — Z203 Contact with and (suspected) exposure to rabies: Secondary | ICD-10-CM | POA: Insufficient documentation

## 2010-10-25 ENCOUNTER — Inpatient Hospital Stay (INDEPENDENT_AMBULATORY_CARE_PROVIDER_SITE_OTHER)
Admission: RE | Admit: 2010-10-25 | Discharge: 2010-10-25 | Disposition: A | Discharge status: Home / Self Care | Payer: 59 | Source: Ambulatory Visit | Attending: Family Medicine | Admitting: Family Medicine

## 2010-10-25 DIAGNOSIS — Z23 Encounter for immunization: Secondary | ICD-10-CM

## 2010-10-28 ENCOUNTER — Inpatient Hospital Stay (INDEPENDENT_AMBULATORY_CARE_PROVIDER_SITE_OTHER)
Admission: RE | Admit: 2010-10-28 | Discharge: 2010-10-28 | Disposition: A | Discharge status: Home / Self Care | Payer: 59 | Source: Ambulatory Visit | Attending: Emergency Medicine | Admitting: Emergency Medicine

## 2010-10-28 DIAGNOSIS — Z23 Encounter for immunization: Secondary | ICD-10-CM

## 2010-11-02 ENCOUNTER — Inpatient Hospital Stay (INDEPENDENT_AMBULATORY_CARE_PROVIDER_SITE_OTHER)
Admission: RE | Admit: 2010-11-02 | Discharge: 2010-11-02 | Disposition: A | Discharge status: Home / Self Care | Payer: 59 | Source: Ambulatory Visit | Attending: Family Medicine | Admitting: Family Medicine

## 2010-11-02 DIAGNOSIS — J069 Acute upper respiratory infection, unspecified: Secondary | ICD-10-CM

## 2010-11-02 DIAGNOSIS — Z23 Encounter for immunization: Secondary | ICD-10-CM

## 2010-11-29 ENCOUNTER — Encounter: Payer: Self-pay | Admitting: Women's Health

## 2010-12-09 ENCOUNTER — Telehealth: Payer: Self-pay | Admitting: *Deleted

## 2010-12-09 NOTE — Telephone Encounter (Signed)
Patient called c/o back pain, spotting for over a week. (not time for period) Has a history of cancer.  Would like to speak to you if possible. Thanks

## 2010-12-09 NOTE — Telephone Encounter (Signed)
Telephone call to review symptoms, she does have a new partner, and has had some spotting with and after intercourse. Did review we need an office appointment to check a UA and cultures with wet prep. Switched to scheduling.

## 2010-12-16 ENCOUNTER — Ambulatory Visit: Payer: 59 | Admitting: Women's Health

## 2010-12-21 ENCOUNTER — Telehealth: Payer: Self-pay | Admitting: *Deleted

## 2010-12-21 DIAGNOSIS — N390 Urinary tract infection, site not specified: Secondary | ICD-10-CM

## 2010-12-21 MED ORDER — NITROFURANTOIN MONOHYD MACRO 100 MG PO CAPS: 100.0000 mg | ORAL_CAPSULE | Freq: Two times a day (BID) | ORAL | Status: AC

## 2010-12-21 NOTE — Telephone Encounter (Signed)
Lm for patient that rx is called in and to recheck u/a after.

## 2010-12-21 NOTE — Telephone Encounter (Signed)
Treated with Macrobid twice a day with food for 7 days. She'll get a followup UA.

## 2010-12-21 NOTE — Telephone Encounter (Signed)
Pt c/o frequent urination. She has been taking Azo OTC for the past several days, some relief but not completely gone. She states doesn't have the money for a copay. She is requesting a RX. Pls advise

## 2010-12-29 ENCOUNTER — Encounter: Payer: Self-pay | Admitting: Women's Health

## 2010-12-29 ENCOUNTER — Ambulatory Visit (INDEPENDENT_AMBULATORY_CARE_PROVIDER_SITE_OTHER): Payer: 59 | Admitting: Women's Health

## 2010-12-29 VITALS — BP 130/70

## 2010-12-29 DIAGNOSIS — Z23 Encounter for immunization: Secondary | ICD-10-CM

## 2010-12-29 DIAGNOSIS — Z113 Encounter for screening for infections with a predominantly sexual mode of transmission: Secondary | ICD-10-CM

## 2010-12-29 DIAGNOSIS — N390 Urinary tract infection, site not specified: Secondary | ICD-10-CM

## 2010-12-29 DIAGNOSIS — R350 Frequency of micturition: Secondary | ICD-10-CM

## 2010-12-29 LAB — HEPATITIS B SURFACE ANTIGEN: Hepatitis B Surface Ag: NEGATIVE

## 2010-12-29 LAB — RPR

## 2010-12-29 NOTE — Progress Notes (Signed)
Addended by: [place], Jaydeen Odor E on: 12/29/2010 01:29 PM   Modules accepted: Orders

## 2010-12-29 NOTE — Progress Notes (Signed)
  Presents for a test of cure urine, UA is negative. Also states has a new partner and requests a STD check. She's using condoms and will continue so.  Exam: Abdomen is soft nontender, external genitalia is within normal limits, speculum exam cervix is pink healthy without lesion or discharge, GC Chlamydia culture was taken and is pending. Bimanual no CMT or adnexal fullness or tenderness. Will also check a HIV, hep B , C. and RPR. Encouraged to continue condoms for infection control and pregnancy prevention. She does have a history of breast cancer, declines any other contraception.

## 2011-01-24 ENCOUNTER — Telehealth: Payer: Self-pay
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
## 2011-01-26 NOTE — Telephone Encounter (Signed)
[name] please call patient, inform she had a negative gonorrhea and Chlamydia cultures on October 17. If she has not been with him since then she is fine. If she has been with him since October 17 best to go to the health department and get a full STD screen.

## 2011-02-08 ENCOUNTER — Emergency Department (HOSPITAL_COMMUNITY)
Admission: EM | Admit: 2011-02-08 | Discharge: 2011-02-08 | Disposition: A | Discharge status: Home / Self Care | Payer: Self-pay | Attending: Emergency Medicine | Admitting: Emergency Medicine

## 2011-02-08 ENCOUNTER — Emergency Department (HOSPITAL_COMMUNITY)
Admission: EM | Admit: 2011-02-08 | Discharge: 2011-02-08 | Disposition: A | Discharge status: Home / Self Care | Payer: Self-pay

## 2011-02-08 ENCOUNTER — Encounter (HOSPITAL_COMMUNITY): Payer: Self-pay | Admitting: Emergency Medicine

## 2011-02-08 ENCOUNTER — Emergency Department (HOSPITAL_COMMUNITY): Payer: Self-pay

## 2011-02-08 DIAGNOSIS — T148XXA Other injury of unspecified body region, initial encounter: Secondary | ICD-10-CM

## 2011-02-08 DIAGNOSIS — R079 Chest pain, unspecified: Secondary | ICD-10-CM | POA: Insufficient documentation

## 2011-02-08 DIAGNOSIS — T1490XA Injury, unspecified, initial encounter: Secondary | ICD-10-CM | POA: Insufficient documentation

## 2011-02-08 MED ORDER — OXYCODONE-ACETAMINOPHEN 5-325 MG PO TABS: 2.0000 | ORAL_TABLET | ORAL | Status: AC | PRN
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
## 2011-02-08 NOTE — ED Notes (Signed)
Pt is at [facility] long
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
## 2011-07-29 ENCOUNTER — Encounter: Payer: Self-pay | Admitting: Family Medicine

## 2011-07-29 ENCOUNTER — Ambulatory Visit (INDEPENDENT_AMBULATORY_CARE_PROVIDER_SITE_OTHER): Payer: PRIVATE HEALTH INSURANCE | Admitting: Family Medicine

## 2011-07-29 ENCOUNTER — Ambulatory Visit: Payer: Self-pay | Admitting: Family Medicine

## 2011-07-29 VITALS — BP 121/85 | HR 81 | Temp 98.6°F | Ht 64.0 in | Wt 163.0 lb

## 2011-07-29 DIAGNOSIS — L720 Epidermal cyst: Secondary | ICD-10-CM

## 2011-07-29 DIAGNOSIS — L723 Sebaceous cyst: Secondary | ICD-10-CM

## 2011-07-29 DIAGNOSIS — N946 Dysmenorrhea, unspecified: Secondary | ICD-10-CM

## 2011-07-29 DIAGNOSIS — Z8679 Personal history of other diseases of the circulatory system: Secondary | ICD-10-CM

## 2011-07-29 DIAGNOSIS — E663 Overweight: Secondary | ICD-10-CM

## 2011-07-29 DIAGNOSIS — Z6825 Body mass index (BMI) 25.0-25.9, adult: Secondary | ICD-10-CM

## 2011-07-29 DIAGNOSIS — Z0000 Encounter for general adult medical examination without abnormal findings: Secondary | ICD-10-CM

## 2011-07-29 HISTORY — DX: Morbid (severe) obesity due to excess calories: E66.01

## 2011-07-29 MED ORDER — IBUPROFEN 600 MG PO TABS
600.0000 mg | ORAL_TABLET | Freq: Three times a day (TID) | ORAL | Status: DC | PRN
Start: 2011-07-29 — End: 2011-08-11

## 2011-07-29 MED ORDER — PHENTERMINE HCL 37.5 MG PO CAPS: 37.5000 mg | ORAL_CAPSULE | ORAL | Status: DC

## 2011-07-29 NOTE — Assessment & Plan Note (Signed)
Patient requests trial of phentermine.  She is aware of the potential cardiac side effects of this med, especially as it pertains specifically to her own PMH of aortic stenosis, and she does still want to proceed with trial of this med, combined with her present aggressive diet and exercise. I agreed to start her on phentermine 37.5mg  qd and she'll f/u in 1 mo with Dr. Abner Greenspan, at which time she'll be fasting and will get routine screening labs (CBC, CMET, TSH, FLP).

## 2011-07-29 NOTE — Assessment & Plan Note (Signed)
Reassured pt that this lesion is not worrisome and can be left alone at this time.

## 2011-07-29 NOTE — Assessment & Plan Note (Signed)
Repaired 2001 (Dr. Arnetha Massy at [facility]).  Patient describes a procedure that was not a valve repair/replacement. Her local [facility] cardiologist (Dr. Fraser Din) is no longer practicing there so she does request referral for routine cardiology f/u with a [place] cardiologist. She is certainly having no symptoms suggestive of cardiovascular limitation; continues to exercise daily without any limitations.

## 2011-07-29 NOTE — Assessment & Plan Note (Signed)
Reviewed age and gender appropriate health maintenance issues (prudent diet, regular exercise, health risks of tobacco and excessive alcohol, use of seatbelts, fire alarms in home, use of sunscreen).  Also reviewed age and gender appropriate health screening as well as vaccine recommendations. Fasting labs next visit in 72mo.
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
## 2011-08-03 ENCOUNTER — Encounter: Payer: Self-pay | Admitting: Cardiology

## 2011-08-03 ENCOUNTER — Encounter: Payer: PRIVATE HEALTH INSURANCE | Admitting: Cardiology

## 2011-08-03 DIAGNOSIS — C50919 Malignant neoplasm of unspecified site of unspecified female breast: Secondary | ICD-10-CM | POA: Insufficient documentation

## 2011-08-03 DIAGNOSIS — Q244 Congenital subaortic stenosis: Secondary | ICD-10-CM | POA: Insufficient documentation

## 2011-08-06 ENCOUNTER — Encounter: Payer: Self-pay | Admitting: Cardiology

## 2011-08-06 NOTE — Progress Notes (Signed)
Patient ID: [name], female   DOB: 1970-09-06, 41 y.o.   MRN: [id number]  This encounter was created in error - please disregard.

## 2011-08-11 ENCOUNTER — Telehealth: Payer: Self-pay | Admitting: *Deleted

## 2011-08-11 DIAGNOSIS — N946 Dysmenorrhea, unspecified: Secondary | ICD-10-CM

## 2011-08-11 MED ORDER — IBUPROFEN 600 MG PO TABS: 600.0000 mg | ORAL_TABLET | Freq: Three times a day (TID) | ORAL | Status: DC | PRN

## 2011-08-11 NOTE — Telephone Encounter (Signed)
Pt states she has called [facility] and they do not have RX for ibuprofen.  Pt needs this called in, but has now requested RX to be sent to [facility].  I verified with pharmacy and they states they never received ibuproen Rx on 5/17 or any other date.  RX sent to [facility].

## 2011-08-26 ENCOUNTER — Encounter: Payer: Self-pay | Admitting: Family Medicine

## 2011-08-26 ENCOUNTER — Ambulatory Visit (INDEPENDENT_AMBULATORY_CARE_PROVIDER_SITE_OTHER): Payer: PRIVATE HEALTH INSURANCE | Admitting: Family Medicine

## 2011-08-26 VITALS — BP 128/82 | HR 86 | Temp 97.6°F | Ht 64.0 in | Wt 157.0 lb

## 2011-08-26 DIAGNOSIS — Z6825 Body mass index (BMI) 25.0-25.9, adult: Secondary | ICD-10-CM

## 2011-08-26 DIAGNOSIS — Z0000 Encounter for general adult medical examination without abnormal findings: Secondary | ICD-10-CM

## 2011-08-26 DIAGNOSIS — E663 Overweight: Secondary | ICD-10-CM

## 2011-08-26 DIAGNOSIS — Q244 Congenital subaortic stenosis: Secondary | ICD-10-CM

## 2011-08-26 DIAGNOSIS — M542 Cervicalgia: Secondary | ICD-10-CM

## 2011-08-26 DIAGNOSIS — K219 Gastro-esophageal reflux disease without esophagitis: Secondary | ICD-10-CM

## 2011-08-26 DIAGNOSIS — C50919 Malignant neoplasm of unspecified site of unspecified female breast: Secondary | ICD-10-CM

## 2011-08-26 DIAGNOSIS — T7840XA Allergy, unspecified, initial encounter: Secondary | ICD-10-CM

## 2011-08-26 HISTORY — DX: Encounter for general adult medical examination without abnormal findings: Z00.00

## 2011-08-26 HISTORY — DX: Cervicalgia: M54.2

## 2011-08-26 LAB — LIPID PANEL: HDL: 53 mg/dL (ref >39.00)

## 2011-08-26 LAB — CBC
HCT: 40.1 % (ref 36.0–46.0)
Hemoglobin: 13.1 g/dL (ref 12.0–15.0)
MCV: 103.1 fl — ABNORMAL HIGH (ref 78.0–100.0)
RDW: 12.5 % (ref 11.5–14.6)
WBC: 6.6 10*3/uL (ref 4.5–10.5)

## 2011-08-26 LAB — RENAL FUNCTION PANEL
BUN: 13 mg/dL (ref 6–23)
CO2: 29 mEq/L (ref 19–32)
Creatinine, Ser: 0.7 mg/dL (ref 0.4–1.2)
GFR: 97.56 mL/min (ref >60.00)
Glucose, Bld: 74 mg/dL (ref 70–99)

## 2011-08-26 LAB — HEPATIC FUNCTION PANEL
ALT: 21 U/L (ref 0–35)
AST: 19 U/L (ref 0–37)
Albumin: 4.1 g/dL (ref 3.5–5.2)

## 2011-08-26 LAB — TSH: TSH: 1.78 u[IU]/mL (ref 0.35–5.50)

## 2011-08-26 MED ORDER — PHENTERMINE HCL 37.5 MG PO TABS: 37.5000 mg | ORAL_TABLET | Freq: Every day | ORAL | Status: DC

## 2011-08-26 MED ORDER — CYCLOBENZAPRINE HCL 10 MG PO TABS: 10.0000 mg | ORAL_TABLET | Freq: Every evening | ORAL | Status: AC | PRN

## 2011-08-26 NOTE — Assessment & Plan Note (Signed)
Fasting labs ordered today, encouraged DASH diet, minimal Alcohol intake, increase exercise
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
## 2011-08-26 NOTE — Assessment & Plan Note (Signed)
Mild, occasional symptoms good response

## 2011-08-26 NOTE — Assessment & Plan Note (Signed)
Stable, asymptomatic. 

## 2011-08-26 NOTE — Assessment & Plan Note (Signed)
Patient receives [facility] at [facility]. Given order for [facility] today

## 2011-08-26 NOTE — Assessment & Plan Note (Signed)
Patient requests the switch to Phentermine tablets from capsules she is given #30 with 2 rf. Encouraged DASH diet and increased exercise. Reassess in 3 months

## 2011-08-26 NOTE — Patient Instructions (Addendum)
Preventive Care for Adults, Female A healthy lifestyle and preventive care can promote health and wellness. Preventive health guidelines for women include the following key practices.  A routine yearly physical is a good way to check with your caregiver about your health and preventive screening. It is a chance to share any concerns and updates on your health, and to receive a thorough exam.   Visit your dentist for a routine exam and preventive care every 6 months. Brush your teeth twice a day and floss once a day. Good oral hygiene prevents tooth decay and gum disease.   The frequency of eye exams is based on your age, health, family medical history, use of contact lenses, and other factors. Follow your caregiver's recommendations for frequency of eye exams.   Eat a healthy diet. Foods like vegetables, fruits, whole grains, low-fat dairy products, and lean protein foods contain the nutrients you need without too many calories. Decrease your intake of foods high in solid fats, added sugars, and salt. Eat the right amount of calories for you.Get information about a proper diet from your caregiver, if necessary.   Regular physical exercise is one of the most important things you can do for your health. Most adults should get at least 150 minutes of moderate-intensity exercise (any activity that increases your heart rate and causes you to sweat) each week. In addition, most adults need muscle-strengthening exercises on 2 or more days a week.   Maintain a healthy weight. The body mass index (BMI) is a screening tool to identify possible weight problems. It provides an estimate of body fat based on height and weight. Your caregiver can help determine your BMI, and can help you achieve or maintain a healthy weight.For adults 20 years and older:   A BMI below 18.5 is considered underweight.   A BMI of 18.5 to 24.9 is normal.   A BMI of 25 to 29.9 is considered overweight.   A BMI of 30 and above is  considered obese.   Maintain normal blood lipids and cholesterol levels by exercising and minimizing your intake of saturated fat. Eat a balanced diet with plenty of fruit and vegetables. Blood tests for lipids and cholesterol should begin at age 20 and be repeated every 5 years. If your lipid or cholesterol levels are high, you are over 50, or you are at high risk for heart disease, you may need your cholesterol levels checked more frequently.Ongoing high lipid and cholesterol levels should be treated with medicines if diet and exercise are not effective.   If you smoke, find out from your caregiver how to quit. If you do not use tobacco, do not start.   If you are pregnant, do not drink alcohol. If you are breastfeeding, be very cautious about drinking alcohol. If you are not pregnant and choose to drink alcohol, do not exceed 1 drink per day. One drink is considered to be 12 ounces (355 mL) of beer, 5 ounces (148 mL) of wine, or 1.5 ounces (44 mL) of liquor.   Avoid use of street drugs. Do not share needles with anyone. Ask for help if you need support or instructions about stopping the use of drugs.   High blood pressure causes heart disease and increases the risk of stroke. Your blood pressure should be checked at least every 1 to 2 years. Ongoing high blood pressure should be treated with medicines if weight loss and exercise are not effective.   If you are 55 to 42   years old, ask your caregiver if you should take aspirin to prevent strokes.   Diabetes screening involves taking a blood sample to check your fasting blood sugar level. This should be done once every 3 years, after age 45, if you are within normal weight and without risk factors for diabetes. Testing should be considered at a younger age or be carried out more frequently if you are overweight and have at least 1 risk factor for diabetes.   Breast cancer screening is essential preventive care for women. You should practice "breast  self-awareness." This means understanding the normal appearance and feel of your breasts and may include breast self-examination. Any changes detected, no matter how small, should be reported to a caregiver. Women in their 20s and 30s should have a clinical breast exam (CBE) by a caregiver as part of a regular health exam every 1 to 3 years. After age 40, women should have a CBE every year. Starting at age 40, women should consider having a mammography (breast X-ray test) every year. Women who have a family history of breast cancer should talk to their caregiver about genetic screening. Women at a high risk of breast cancer should talk to their caregivers about having magnetic resonance imaging (MRI) and a mammography every year.   The Pap test is a screening test for cervical cancer. A Pap test can show cell changes on the cervix that might become cervical cancer if left untreated. A Pap test is a procedure in which cells are obtained and examined from the lower end of the uterus (cervix).   Women should have a Pap test starting at age 21.   Between ages 21 and 29, Pap tests should be repeated every 2 years.   Beginning at age 30, you should have a Pap test every 3 years as long as the past 3 Pap tests have been normal.   Some women have medical problems that increase the chance of getting cervical cancer. Talk to your caregiver about these problems. It is especially important to talk to your caregiver if a new problem develops soon after your last Pap test. In these cases, your caregiver may recommend more frequent screening and Pap tests.   The above recommendations are the same for women who have or have not gotten the vaccine for human papillomavirus (HPV).   If you had a hysterectomy for a problem that was not cancer or a condition that could lead to cancer, then you no longer need Pap tests. Even if you no longer need a Pap test, a regular exam is a good idea to make sure no other problems are  starting.   If you are between ages 65 and 70, and you have had normal Pap tests going back 10 years, you no longer need Pap tests. Even if you no longer need a Pap test, a regular exam is a good idea to make sure no other problems are starting.   If you have had past treatment for cervical cancer or a condition that could lead to cancer, you need Pap tests and screening for cancer for at least 20 years after your treatment.   If Pap tests have been discontinued, risk factors (such as a new sexual partner) need to be reassessed to determine if screening should be resumed.   The HPV test is an additional test that may be used for cervical cancer screening. The HPV test looks for the virus that can cause the cell changes on the cervix.   The cells collected during the Pap test can be tested for HPV. The HPV test could be used to screen women aged 30 years and older, and should be used in women of any age who have unclear Pap test results. After the age of 30, women should have HPV testing at the same frequency as a Pap test.   Colorectal cancer can be detected and often prevented. Most routine colorectal cancer screening begins at the age of 50 and continues through age 75. However, your caregiver may recommend screening at an earlier age if you have risk factors for colon cancer. On a yearly basis, your caregiver may provide home test kits to check for hidden blood in the stool. Use of a small camera at the end of a tube, to directly examine the colon (sigmoidoscopy or colonoscopy), can detect the earliest forms of colorectal cancer. Talk to your caregiver about this at age 50, when routine screening begins. Direct examination of the colon should be repeated every 5 to 10 years through age 75, unless early forms of pre-cancerous polyps or small growths are found.   Hepatitis C blood testing is recommended for all people born from 1945 through 1965 and any individual with known risks for hepatitis C.    Practice safe sex. Use condoms and avoid high-risk sexual practices to reduce the spread of sexually transmitted infections (STIs). STIs include gonorrhea, chlamydia, syphilis, trichomonas, herpes, HPV, and human immunodeficiency virus (HIV). Herpes, HIV, and HPV are viral illnesses that have no cure. They can result in disability, cancer, and death. Sexually active women aged 25 and younger should be checked for chlamydia. Older women with new or multiple partners should also be tested for chlamydia. Testing for other STIs is recommended if you are sexually active and at increased risk.   Osteoporosis is a disease in which the bones lose minerals and strength with aging. This can result in serious bone fractures. The risk of osteoporosis can be identified using a bone density scan. Women ages 65 and over and women at risk for fractures or osteoporosis should discuss screening with their caregivers. Ask your caregiver whether you should take a calcium supplement or vitamin D to reduce the rate of osteoporosis.   Menopause can be associated with physical symptoms and risks. Hormone replacement therapy is available to decrease symptoms and risks. You should talk to your caregiver about whether hormone replacement therapy is right for you.   Use sunscreen with sun protection factor (SPF) of 30 or more. Apply sunscreen liberally and repeatedly throughout the day. You should seek shade when your shadow is shorter than you. Protect yourself by wearing long sleeves, pants, a wide-brimmed hat, and sunglasses year round, whenever you are outdoors.   Once a month, do a whole body skin exam, using a mirror to look at the skin on your back. Notify your caregiver of new moles, moles that have irregular borders, moles that are larger than a pencil eraser, or moles that have changed in shape or color.   Stay current with required immunizations.   Influenza. You need a dose every fall (or winter). The composition of  the flu vaccine changes each year, so being vaccinated once is not enough.   Pneumococcal polysaccharide. You need 1 to 2 doses if you smoke cigarettes or if you have certain chronic medical conditions. You need 1 dose at age 65 (or older) if you have never been vaccinated.   Tetanus, diphtheria, pertussis (Tdap, Td). Get 1 dose of   Tdap vaccine if you are younger than age 65, are over 65 and have contact with an infant, are a healthcare worker, are pregnant, or simply want to be protected from whooping cough. After that, you need a Td booster dose every 10 years. Consult your caregiver if you have not had at least 3 tetanus and diphtheria-containing shots sometime in your life or have a deep or dirty wound.   HPV. You need this vaccine if you are a woman age 26 or younger. The vaccine is given in 3 doses over 6 months.   Measles, mumps, rubella (MMR). You need at least 1 dose of MMR if you were born in 1957 or later. You may also need a second dose.   Meningococcal. If you are age 19 to 21 and a first-year college student living in a residence hall, or have one of several medical conditions, you need to get vaccinated against meningococcal disease. You may also need additional booster doses.   Zoster (shingles). If you are age 60 or older, you should get this vaccine.   Varicella (chickenpox). If you have never had chickenpox or you were vaccinated but received only 1 dose, talk to your caregiver to find out if you need this vaccine.   Hepatitis A. You need this vaccine if you have a specific risk factor for hepatitis A virus infection or you simply wish to be protected from this disease. The vaccine is usually given as 2 doses, 6 to 18 months apart.   Hepatitis B. You need this vaccine if you have a specific risk factor for hepatitis B virus infection or you simply wish to be protected from this disease. The vaccine is given in 3 doses, usually over 6 months.  Preventive Services /  Frequency Ages 19 to 39  Blood pressure check.** / Every 1 to 2 years.   Lipid and cholesterol check.** / Every 5 years beginning at age 20.   Clinical breast exam.** / Every 3 years for women in their 20s and 30s.   Pap test.** / Every 2 years from ages 21 through 29. Every 3 years starting at age 30 through age 65 or 70 with a history of 3 consecutive normal Pap tests.   HPV screening.** / Every 3 years from ages 30 through ages 65 to 70 with a history of 3 consecutive normal Pap tests.   Hepatitis C blood test.** / For any individual with known risks for hepatitis C.   Skin self-exam. / Monthly.   Influenza immunization.** / Every year.   Pneumococcal polysaccharide immunization.** / 1 to 2 doses if you smoke cigarettes or if you have certain chronic medical conditions.   Tetanus, diphtheria, pertussis (Tdap, Td) immunization. / A one-time dose of Tdap vaccine. After that, you need a Td booster dose every 10 years.   HPV immunization. / 3 doses over 6 months, if you are 26 and younger.   Measles, mumps, rubella (MMR) immunization. / You need at least 1 dose of MMR if you were born in 1957 or later. You may also need a second dose.   Meningococcal immunization. / 1 dose if you are age 19 to 21 and a first-year college student living in a residence hall, or have one of several medical conditions, you need to get vaccinated against meningococcal disease. You may also need additional booster doses.   Varicella immunization.** / Consult your caregiver.   Hepatitis A immunization.** / Consult your caregiver. 2 doses, 6 to 18 months   apart.   Hepatitis B immunization.** / Consult your caregiver. 3 doses usually over 6 months.  Ages 40 to 64  Blood pressure check.** / Every 1 to 2 years.   Lipid and cholesterol check.** / Every 5 years beginning at age 20.   Clinical breast exam.** / Every year after age 40.   Mammogram.** / Every year beginning at age 40 and continuing for as  long as you are in good health. Consult with your caregiver.   Pap test.** / Every 3 years starting at age 30 through age 65 or 70 with a history of 3 consecutive normal Pap tests.   HPV screening.** / Every 3 years from ages 30 through ages 65 to 70 with a history of 3 consecutive normal Pap tests.   Fecal occult blood test (FOBT) of stool. / Every year beginning at age 50 and continuing until age 75. You may not need to do this test if you get a colonoscopy every 10 years.   Flexible sigmoidoscopy or colonoscopy.** / Every 5 years for a flexible sigmoidoscopy or every 10 years for a colonoscopy beginning at age 50 and continuing until age 75.   Hepatitis C blood test.** / For all people born from 1945 through 1965 and any individual with known risks for hepatitis C.   Skin self-exam. / Monthly.   Influenza immunization.** / Every year.   Pneumococcal polysaccharide immunization.** / 1 to 2 doses if you smoke cigarettes or if you have certain chronic medical conditions.   Tetanus, diphtheria, pertussis (Tdap, Td) immunization.** / A one-time dose of Tdap vaccine. After that, you need a Td booster dose every 10 years.   Measles, mumps, rubella (MMR) immunization. / You need at least 1 dose of MMR if you were born in 1957 or later. You may also need a second dose.   Varicella immunization.** / Consult your caregiver.   Meningococcal immunization.** / Consult your caregiver.   Hepatitis A immunization.** / Consult your caregiver. 2 doses, 6 to 18 months apart.   Hepatitis B immunization.** / Consult your caregiver. 3 doses, usually over 6 months.  Ages 65 and over  Blood pressure check.** / Every 1 to 2 years.   Lipid and cholesterol check.** / Every 5 years beginning at age 20.   Clinical breast exam.** / Every year after age 40.   Mammogram.** / Every year beginning at age 40 and continuing for as long as you are in good health. Consult with your caregiver.   Pap test.** /  Every 3 years starting at age 30 through age 65 or 70 with a 3 consecutive normal Pap tests. Testing can be stopped between 65 and 70 with 3 consecutive normal Pap tests and no abnormal Pap or HPV tests in the past 10 years.   HPV screening.** / Every 3 years from ages 30 through ages 65 or 70 with a history of 3 consecutive normal Pap tests. Testing can be stopped between 65 and 70 with 3 consecutive normal Pap tests and no abnormal Pap or HPV tests in the past 10 years.   Fecal occult blood test (FOBT) of stool. / Every year beginning at age 50 and continuing until age 75. You may not need to do this test if you get a colonoscopy every 10 years.   Flexible sigmoidoscopy or colonoscopy.** / Every 5 years for a flexible sigmoidoscopy or every 10 years for a colonoscopy beginning at age 50 and continuing until age 75.   Hepatitis   C blood test.** / For all people born from 1945 through 1965 and any individual with known risks for hepatitis C.   Osteoporosis screening.** / A one-time screening for women ages 65 and over and women at risk for fractures or osteoporosis.   Skin self-exam. / Monthly.   Influenza immunization.** / Every year.   Pneumococcal polysaccharide immunization.** / 1 dose at age 65 (or older) if you have never been vaccinated.   Tetanus, diphtheria, pertussis (Tdap, Td) immunization. / A one-time dose of Tdap vaccine if you are over 65 and have contact with an infant, are a healthcare worker, or simply want to be protected from whooping cough. After that, you need a Td booster dose every 10 years.   Varicella immunization.** / Consult your caregiver.   Meningococcal immunization.** / Consult your caregiver.   Hepatitis A immunization.** / Consult your caregiver. 2 doses, 6 to 18 months apart.   Hepatitis B immunization.** / Check with your caregiver. 3 doses, usually over 6 months.  ** Family history and personal history of risk and conditions may change your caregiver's  recommendations. Document Released: 04/26/2001 Document Revised: 02/17/2011 Document Reviewed: 07/26/2010 [employer] Patient Information 2012 [employer]. 

## 2011-08-26 NOTE — Assessment & Plan Note (Signed)
Moist heat gentle stretching and given a small amount of Cyclobenzaprine to try qhs prn pain

## 2011-08-26 NOTE — Assessment & Plan Note (Signed)
Some head pressure, unclear allergies, consider Zyrtec as needed

## 2011-11-25 ENCOUNTER — Ambulatory Visit: Payer: PRIVATE HEALTH INSURANCE | Admitting: Family Medicine

## 2011-12-09 ENCOUNTER — Ambulatory Visit (INDEPENDENT_AMBULATORY_CARE_PROVIDER_SITE_OTHER): Payer: PRIVATE HEALTH INSURANCE | Admitting: Family Medicine

## 2011-12-09 ENCOUNTER — Encounter: Payer: Self-pay | Admitting: Family Medicine

## 2011-12-09 VITALS — BP 135/86 | HR 92 | Temp 98.3°F | Ht 64.0 in | Wt 157.4 lb

## 2011-12-09 DIAGNOSIS — Z0000 Encounter for general adult medical examination without abnormal findings: Secondary | ICD-10-CM

## 2011-12-09 DIAGNOSIS — Z23 Encounter for immunization: Secondary | ICD-10-CM

## 2011-12-09 DIAGNOSIS — F988 Other specified behavioral and emotional disorders with onset usually occurring in childhood and adolescence: Secondary | ICD-10-CM

## 2011-12-09 DIAGNOSIS — E663 Overweight: Secondary | ICD-10-CM

## 2011-12-09 DIAGNOSIS — Z6825 Body mass index (BMI) 25.0-25.9, adult: Secondary | ICD-10-CM

## 2011-12-09 DIAGNOSIS — Q244 Congenital subaortic stenosis: Secondary | ICD-10-CM

## 2011-12-09 HISTORY — DX: Other specified behavioral and emotional disorders with onset usually occurring in childhood and adolescence: F98.8

## 2011-12-09 MED ORDER — METHYLPHENIDATE HCL 10 MG PO TABS: 10.0000 mg | ORAL_TABLET | Freq: Two times a day (BID) | ORAL | Status: DC
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
## 2011-12-09 NOTE — Assessment & Plan Note (Signed)
asymptomatic

## 2011-12-09 NOTE — Assessment & Plan Note (Signed)
Patient initially felt phentermine was helping but no longer feels that way. She continues to exercise and trying to eat right and still has struggles with weight gain. Encouraged DASH diet and will d/c Phentermine

## 2011-12-09 NOTE — Assessment & Plan Note (Signed)
Is positive for ADD by screen in office, will try Ritalin 10 mg po bid prn and recheck vitals isn 3-4 weeks time. Warned regarding possible side effects

## 2011-12-09 NOTE — Assessment & Plan Note (Signed)
Flu shot given today

## 2011-12-09 NOTE — Patient Instructions (Addendum)
Attention Deficit Hyperactivity Disorder Attention deficit hyperactivity disorder (ADHD) is a problem with behavior issues based on the way the brain functions (neurobehavioral disorder). It is a common reason for behavior and academic problems in school. CAUSES  The cause of ADHD is unknown in most cases. It may run in families. It sometimes can be associated with learning disabilities and other behavioral problems. SYMPTOMS  There are 3 types of ADHD. The 3 types and some of the symptoms include:  Inattentive   Gets bored or distracted easily.   Loses or forgets things. Forgets to hand in homework.   Has trouble organizing or completing tasks.   Difficulty staying on task.   An inability to organize daily tasks and school work.   Leaving projects, chores, or homework unfinished.   Trouble paying attention or responding to details. Careless mistakes.   Difficulty following directions. Often seems like is not listening.   Dislikes activities that require sustained attention (like chores or homework).   Hyperactive-impulsive   Feels like it is impossible to sit still or stay in a seat. Fidgeting with hands and feet.   Trouble waiting turn.   Talking too much or out of turn. Interruptive.   Speaks or acts impulsively.   Aggressive, disruptive behavior.   Constantly busy or on the go, noisy.   Combined   Has symptoms of both of the above.  Often children with ADHD feel discouraged about themselves and with school. They often perform well below their abilities in school. These symptoms can cause problems in home, school, and in relationships with peers. As children get older, the excess motor activities can calm down, but the problems with paying attention and staying organized persist. Most children do not outgrow ADHD but with good treatment can learn to cope with the symptoms. DIAGNOSIS  When ADHD is suspected, the diagnosis should be made by professionals trained in  ADHD.  Diagnosis will include:  Ruling out other reasons for the child's behavior.   The caregivers will check with the child's school and check their medical records.   They will talk to teachers and parents.   Behavior rating scales for the child will be filled out by those dealing with the child on a daily basis.  A diagnosis is made only after all information has been considered. TREATMENT  Treatment usually includes behavioral treatment often along with medicines. It may include stimulant medicines. The stimulant medicines decrease impulsivity and hyperactivity and increase attention. Other medicines used include antidepressants and certain blood pressure medicines. Most experts agree that treatment for ADHD should address all aspects of the child's functioning. Treatment should not be limited to the use of medicines alone. Treatment should include structured classroom management. The parents must receive education to address rewarding good behavior, discipline, and limit-setting. Tutoring or behavioral therapy or both should be available for the child. If untreated, the disorder can have long-term serious effects into adolescence and adulthood. HOME CARE INSTRUCTIONS   Often with ADHD there is a lot of frustration among the family in dealing with the illness. There is often blame and anger that is not warranted. This is a life long illness. There is no way to prevent ADHD. In many cases, because the problem affects the family as a whole, the entire family may need help. A therapist can help the family find better ways to handle the disruptive behaviors and promote change. If the child is young, most of the therapist's work is with the parents. Parents will   learn techniques for coping with and improving their child's behavior. Sometimes only the child with the ADHD needs counseling. Your caregivers can help you make these decisions.   Children with ADHD may need help in organizing. Some  helpful tips include:   Keep routines the same every day from wake-up time to bedtime. Schedule everything. This includes homework and playtime. This should include outdoor and indoor recreation. Keep the schedule on the refrigerator or a bulletin board where it is frequently seen. Mark schedule changes as far in advance as possible.   Have a place for everything and keep everything in its place. This includes clothing, backpacks, and school supplies.   Encourage writing down assignments and bringing home needed books.   Offer your child a well-balanced diet. Breakfast is especially important for school performance. Children should avoid drinks with caffeine including:   Soft drinks.   Coffee.   Tea.   However, some older children (adolescents) may find these drinks helpful in improving their attention.   Children with ADHD need consistent rules that they can understand and follow. If rules are followed, give small rewards. Children with ADHD often receive, and expect, criticism. Look for good behavior and praise it. Set realistic goals. Give clear instructions. Look for activities that can foster success and self-esteem. Make time for pleasant activities with your child. Give lots of affection.   Parents are their children's greatest advocates. Learn as much as possible about ADHD. This helps you become a stronger and better advocate for your child. It also helps you educate your child's teachers and instructors if they feel inadequate in these areas. Parent support groups are often helpful. A national group with local chapters is called CHADD (Children and Adults with Attention Deficit Hyperactivity Disorder).  PROGNOSIS  There is no cure for ADHD. Children with the disorder seldom outgrow it. Many find adaptive ways to accommodate the ADHD as they mature. SEEK MEDICAL CARE IF:  Your child has repeated muscle twitches, cough or speech outbursts.   Your child has sleep problems.   Your  child has a marked loss of appetite.   Your child develops depression.   Your child has new or worsening behavioral problems.   Your child develops dizziness.   Your child has a racing heart.   Your child has stomach pains.   Your child develops headaches.  Document Released: 02/18/2002 Document Revised: 02/17/2011 Document Reviewed: 10/01/2007 Exit[employer] Patient Information 2012 [employer]. 

## 2011-12-19 ENCOUNTER — Encounter: Payer: Self-pay | Admitting: Family Medicine

## 2011-12-23 ENCOUNTER — Other Ambulatory Visit: Payer: Self-pay | Admitting: Women's Health

## 2011-12-23 ENCOUNTER — Encounter: Payer: Self-pay | Admitting: Women's Health

## 2011-12-23 ENCOUNTER — Ambulatory Visit (INDEPENDENT_AMBULATORY_CARE_PROVIDER_SITE_OTHER): Payer: PRIVATE HEALTH INSURANCE | Admitting: Women's Health

## 2011-12-23 VITALS — BP 120/80 | Ht 64.0 in | Wt 158.0 lb

## 2011-12-23 DIAGNOSIS — N912 Amenorrhea, unspecified: Secondary | ICD-10-CM

## 2011-12-23 DIAGNOSIS — B9689 Other specified bacterial agents as the cause of diseases classified elsewhere: Secondary | ICD-10-CM

## 2011-12-23 DIAGNOSIS — Z01419 Encounter for gynecological examination (general) (routine) without abnormal findings: Secondary | ICD-10-CM

## 2011-12-23 DIAGNOSIS — Z113 Encounter for screening for infections with a predominantly sexual mode of transmission: Secondary | ICD-10-CM

## 2011-12-23 DIAGNOSIS — C50919 Malignant neoplasm of unspecified site of unspecified female breast: Secondary | ICD-10-CM

## 2011-12-23 DIAGNOSIS — N760 Acute vaginitis: Secondary | ICD-10-CM

## 2011-12-23 DIAGNOSIS — A499 Bacterial infection, unspecified: Secondary | ICD-10-CM

## 2011-12-23 LAB — WET PREP FOR TRICH, YEAST, CLUE: WBC, Wet Prep HPF POC: NONE SEEN

## 2011-12-23 LAB — FOLLICLE STIMULATING HORMONE: FSH: 5 m[IU]/mL

## 2011-12-23 LAB — HEPATITIS B SURFACE ANTIGEN: Hepatitis B Surface Ag: NEGATIVE

## 2011-12-23 LAB — HCG, SERUM, QUALITATIVE: Preg, Serum: NEGATIVE

## 2011-12-23 MED ORDER — METRONIDAZOLE 0.75 % VA GEL: VAGINAL | Status: DC

## 2011-12-23 MED ORDER — MEDROXYPROGESTERONE ACETATE 10 MG PO TABS: 10.0000 mg | ORAL_TABLET | Freq: Every day | ORAL | Status: DC
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
## 2011-12-23 NOTE — Patient Instructions (Addendum)
Vit 2000 daily  Schedule MAMMOGRAM!!!!!!!  Health Maintenance, Females A healthy lifestyle and preventative care can promote health and wellness.  Maintain regular health, dental, and eye exams.  Eat a healthy diet. Foods like vegetables, fruits, whole grains, low-fat dairy products, and lean protein foods contain the nutrients you need without too many calories. Decrease your intake of foods high in solid fats, added sugars, and salt. Get information about a proper diet from your caregiver, if necessary.  Regular physical exercise is one of the most important things you can do for your health. Most adults should get at least 150 minutes of moderate-intensity exercise (any activity that increases your heart rate and causes you to sweat) each week. In addition, most adults need muscle-strengthening exercises on 2 or more days a week.   Maintain a healthy weight. The body mass index (BMI) is a screening tool to identify possible weight problems. It provides an estimate of body fat based on height and weight. Your caregiver can help determine your BMI, and can help you achieve or maintain a healthy weight. For adults 20 years and older:  A BMI below 18.5 is considered underweight.  A BMI of 18.5 to 24.9 is normal.  A BMI of 25 to 29.9 is considered overweight.  A BMI of 30 and above is considered obese.  Maintain normal blood lipids and cholesterol by exercising and minimizing your intake of saturated fat. Eat a balanced diet with plenty of fruits and vegetables. Blood tests for lipids and cholesterol should begin at age 42 and be repeated every 5 years. If your lipid or cholesterol levels are high, you are over 50, or you are a high risk for heart disease, you may need your cholesterol levels checked more frequently.Ongoing high lipid and cholesterol levels should be treated with medicines if diet and exercise are not effective.  If you smoke, find out from your caregiver how to quit. If you  do not use tobacco, do not start.  If you are pregnant, do not drink alcohol. If you are breastfeeding, be very cautious about drinking alcohol. If you are not pregnant and choose to drink alcohol, do not exceed 1 drink per day. One drink is considered to be 12 ounces (355 mL) of beer, 5 ounces (148 mL) of wine, or 1.5 ounces (44 mL) of liquor.  Avoid use of street drugs. Do not share needles with anyone. Ask for help if you need support or instructions about stopping the use of drugs.  High blood pressure causes heart disease and increases the risk of stroke. Blood pressure should be checked at least every 1 to 2 years. Ongoing high blood pressure should be treated with medicines, if weight loss and exercise are not effective.  If you are 39 to 42 years old, ask your caregiver if you should take aspirin to prevent strokes.  Diabetes screening involves taking a blood sample to check your fasting blood sugar level. This should be done once every 3 years, after age 59, if you are within normal weight and without risk factors for diabetes. Testing should be considered at a younger age or be carried out more frequently if you are overweight and have at least 1 risk factor for diabetes.  Breast cancer screening is essential preventative care for women. You should practice "breast self-awareness." This means understanding the normal appearance and feel of your breasts and may include breast self-examination. Any changes detected, no matter how small, should be reported to a caregiver. Women  in their 69s and 30s should have a clinical breast exam (CBE) by a caregiver as part of a regular health exam every 1 to 3 years. After age 40, women should have a CBE every year. Starting at age 57, women should consider having a mammogram (breast X-ray) every year. Women who have a family history of breast cancer should talk to their caregiver about genetic screening. Women at a high risk of breast cancer should talk to  their caregiver about having an MRI and a mammogram every year.  The Pap test is a screening test for cervical cancer. Women should have a Pap test starting at age 44. Between ages 33 and 46, Pap tests should be repeated every 2 years. Beginning at age 24, you should have a Pap test every 3 years as long as the past 3 Pap tests have been normal. If you had a hysterectomy for a problem that was not cancer or a condition that could lead to cancer, then you no longer need Pap tests. If you are between ages 64 and 25, and you have had normal Pap tests going back 10 years, you no longer need Pap tests. If you have had past treatment for cervical cancer or a condition that could lead to cancer, you need Pap tests and screening for cancer for at least 20 years after your treatment. If Pap tests have been discontinued, risk factors (such as a new sexual partner) need to be reassessed to determine if screening should be resumed. Some women have medical problems that increase the chance of getting cervical cancer. In these cases, your caregiver may recommend more frequent screening and Pap tests.  The human papillomavirus (HPV) test is an additional test that may be used for cervical cancer screening. The HPV test looks for the virus that can cause the cell changes on the cervix. The cells collected during the Pap test can be tested for HPV. The HPV test could be used to screen women aged 51 years and older, and should be used in women of any age who have unclear Pap test results. After the age of 52, women should have HPV testing at the same frequency as a Pap test.  Colorectal cancer can be detected and often prevented. Most routine colorectal cancer screening begins at the age of 80 and continues through age 71. However, your caregiver may recommend screening at an earlier age if you have risk factors for colon cancer. On a yearly basis, your caregiver may provide home test kits to check for hidden blood in the  stool. Use of a small camera at the end of a tube, to directly examine the colon (sigmoidoscopy or colonoscopy), can detect the earliest forms of colorectal cancer. Talk to your caregiver about this at age 57, when routine screening begins. Direct examination of the colon should be repeated every 5 to 10 years through age 41, unless early forms of pre-cancerous polyps or small growths are found.  Hepatitis C blood testing is recommended for all people born from [birth year] through 1966 and any individual with known risks for hepatitis C.  Practice safe sex. Use condoms and avoid high-risk sexual practices to reduce the spread of sexually transmitted infections (STIs). Sexually active women aged 73 and younger should be checked for Chlamydia, which is a common sexually transmitted infection. Older women with new or multiple partners should also be tested for Chlamydia. Testing for other STIs is recommended if you are sexually active and at increased risk.  Osteoporosis is a disease in which the bones lose minerals and strength with aging. This can result in serious bone fractures. The risk of osteoporosis can be identified using a bone density scan. Women ages 25 and over and women at risk for fractures or osteoporosis should discuss screening with their caregivers. Ask your caregiver whether you should be taking a calcium supplement or vitamin D to reduce the rate of osteoporosis.  Menopause can be associated with physical symptoms and risks. Hormone replacement therapy is available to decrease symptoms and risks. You should talk to your caregiver about whether hormone replacement therapy is right for you.  Use sunscreen with a sun protection factor (SPF) of 30 or greater. Apply sunscreen liberally and repeatedly throughout the day. You should seek shade when your shadow is shorter than you. Protect yourself by wearing long sleeves, pants, a wide-brimmed hat, and sunglasses year round, whenever you are  outdoors.  Notify your caregiver of new moles or changes in moles, especially if there is a change in shape or color. Also notify your caregiver if a mole is larger than the size of a pencil eraser.  Stay current with your immunizations. Document Released: 09/13/2010 Document Revised: 05/23/2011 Document Reviewed: 09/13/2010 [facility] Patient Information 2013 [place].

## 2011-12-24 LAB — GC/CHLAMYDIA PROBE AMP, GENITAL: GC Probe Amp, Genital: NEGATIVE

## 2012-01-05 ENCOUNTER — Telehealth: Payer: Self-pay | Admitting: Family Medicine

## 2012-01-05 NOTE — Telephone Encounter (Signed)
Yes, she can have a one month supply of the Ritalin til ssen

## 2012-01-05 NOTE — Telephone Encounter (Signed)
Please advise 

## 2012-01-05 NOTE — Telephone Encounter (Signed)
Patient was on tomorrow afternoons schedule. I have rescheduled her to come in 01/20/12, she does not have enough Ritalin to make it to that appt. Can she get a refill? She said she has been doing very well on the med with no side effects.

## 2012-01-06 ENCOUNTER — Ambulatory Visit: Payer: PRIVATE HEALTH INSURANCE | Admitting: Family Medicine

## 2012-01-06 MED ORDER — METHYLPHENIDATE HCL 10 MG PO TABS: 10.0000 mg | ORAL_TABLET | Freq: Two times a day (BID) | ORAL | Status: DC

## 2012-01-06 NOTE — Telephone Encounter (Signed)
Printed RX and left detailed message, RX at front desk

## 2012-01-12 ENCOUNTER — Telehealth: Payer: Self-pay | Admitting: *Deleted

## 2012-01-12 NOTE — Telephone Encounter (Signed)
Pt was given provera to help start cycle, pt took 1st pill on 01/09/12. Pt said she did have some cramping and bloating, no cycle yet. I explained to pt it could take up to 14 days for cycle to start. She c/o nausea, pt said she ate crackers with medication this am and felt much better. Pt informed to continue taking medication as directed.

## 2012-01-20 ENCOUNTER — Ambulatory Visit: Payer: PRIVATE HEALTH INSURANCE | Admitting: Family Medicine

## 2012-01-27 ENCOUNTER — Ambulatory Visit: Payer: PRIVATE HEALTH INSURANCE | Admitting: Family Medicine

## 2012-01-27 ENCOUNTER — Ambulatory Visit (INDEPENDENT_AMBULATORY_CARE_PROVIDER_SITE_OTHER): Payer: PRIVATE HEALTH INSURANCE | Admitting: Family Medicine

## 2012-01-27 ENCOUNTER — Encounter: Payer: Self-pay | Admitting: Family Medicine

## 2012-01-27 VITALS — BP 126/76 | HR 82 | Temp 98.4°F | Ht 64.0 in | Wt 156.1 lb

## 2012-01-27 DIAGNOSIS — F988 Other specified behavioral and emotional disorders with onset usually occurring in childhood and adolescence: Secondary | ICD-10-CM

## 2012-01-27 DIAGNOSIS — E663 Overweight: Secondary | ICD-10-CM

## 2012-01-27 DIAGNOSIS — Z6825 Body mass index (BMI) 25.0-25.9, adult: Secondary | ICD-10-CM

## 2012-01-27 MED ORDER — METHYLPHENIDATE HCL ER (LA) 30 MG PO CP24: 30.0000 mg | ORAL_CAPSULE | ORAL | Status: DC

## 2012-01-27 MED ORDER — METHYLPHENIDATE HCL 10 MG PO TABS: 10.0000 mg | ORAL_TABLET | Freq: Every day | ORAL | Status: DC | PRN

## 2012-01-27 NOTE — Patient Instructions (Addendum)
Attention Deficit Hyperactivity Disorder Attention deficit hyperactivity disorder (ADHD) is a problem with behavior issues based on the way the brain functions (neurobehavioral disorder). It is a common reason for behavior and academic problems in school. CAUSES  The cause of ADHD is unknown in most cases. It may run in families. It sometimes can be associated with learning disabilities and other behavioral problems. SYMPTOMS  There are 3 types of ADHD. The 3 types and some of the symptoms include:  Inattentive  Gets bored or distracted easily.  Loses or forgets things. Forgets to hand in homework.  Has trouble organizing or completing tasks.  Difficulty staying on task.  An inability to organize daily tasks and school work.  Leaving projects, chores, or homework unfinished.  Trouble paying attention or responding to details. Careless mistakes.  Difficulty following directions. Often seems like is not listening.  Dislikes activities that require sustained attention (like chores or homework).  Hyperactive-impulsive  Feels like it is impossible to sit still or stay in a seat. Fidgeting with hands and feet.  Trouble waiting turn.  Talking too much or out of turn. Interruptive.  Speaks or acts impulsively.  Aggressive, disruptive behavior.  Constantly busy or on the go, noisy.  Combined  Has symptoms of both of the above. Often children with ADHD feel discouraged about themselves and with school. They often perform well below their abilities in school. These symptoms can cause problems in home, school, and in relationships with peers. As children get older, the excess motor activities can calm down, but the problems with paying attention and staying organized persist. Most children do not outgrow ADHD but with good treatment can learn to cope with the symptoms. DIAGNOSIS  When ADHD is suspected, the diagnosis should be made by professionals trained in ADHD.  Diagnosis will  include:  Ruling out other reasons for the child's behavior.  The caregivers will check with the child's school and check their medical records.  They will talk to teachers and parents.  Behavior rating scales for the child will be filled out by those dealing with the child on a daily basis. A diagnosis is made only after all information has been considered. TREATMENT  Treatment usually includes behavioral treatment often along with medicines. It may include stimulant medicines. The stimulant medicines decrease impulsivity and hyperactivity and increase attention. Other medicines used include antidepressants and certain blood pressure medicines. Most experts agree that treatment for ADHD should address all aspects of the child's functioning. Treatment should not be limited to the use of medicines alone. Treatment should include structured classroom management. The parents must receive education to address rewarding good behavior, discipline, and limit-setting. Tutoring or behavioral therapy or both should be available for the child. If untreated, the disorder can have long-term serious effects into adolescence and adulthood. HOME CARE INSTRUCTIONS   Often with ADHD there is a lot of frustration among the family in dealing with the illness. There is often blame and anger that is not warranted. This is a life long illness. There is no way to prevent ADHD. In many cases, because the problem affects the family as a whole, the entire family may need help. A therapist can help the family find better ways to handle the disruptive behaviors and promote change. If the child is young, most of the therapist's work is with the parents. Parents will learn techniques for coping with and improving their child's behavior. Sometimes only the child with the ADHD needs counseling. Your caregivers can help   you make these decisions.  Children with ADHD may need help in organizing. Some helpful tips include:  Keep  routines the same every day from wake-up time to bedtime. Schedule everything. This includes homework and playtime. This should include outdoor and indoor recreation. Keep the schedule on the refrigerator or a bulletin board where it is frequently seen. Mark schedule changes as far in advance as possible.  Have a place for everything and keep everything in its place. This includes clothing, backpacks, and school supplies.  Encourage writing down assignments and bringing home needed books.  Offer your child a well-balanced diet. Breakfast is especially important for school performance. Children should avoid drinks with caffeine including:  Soft drinks.  Coffee.  Tea.  However, some older children (adolescents) may find these drinks helpful in improving their attention.  Children with ADHD need consistent rules that they can understand and follow. If rules are followed, give small rewards. Children with ADHD often receive, and expect, criticism. Look for good behavior and praise it. Set realistic goals. Give clear instructions. Look for activities that can foster success and self-esteem. Make time for pleasant activities with your child. Give lots of affection.  Parents are their children's greatest advocates. Learn as much as possible about ADHD. This helps you become a stronger and better advocate for your child. It also helps you educate your child's teachers and instructors if they feel inadequate in these areas. Parent support groups are often helpful. A national group with local chapters is called CHADD (Children and Adults with Attention Deficit Hyperactivity Disorder). PROGNOSIS  There is no cure for ADHD. Children with the disorder seldom outgrow it. Many find adaptive ways to accommodate the ADHD as they mature. SEEK MEDICAL CARE IF:  Your child has repeated muscle twitches, cough or speech outbursts.  Your child has sleep problems.  Your child has a marked loss of  appetite.  Your child develops depression.  Your child has new or worsening behavioral problems.  Your child develops dizziness.  Your child has a racing heart.  Your child has stomach pains.  Your child develops headaches. Document Released: 02/18/2002 Document Revised: 05/23/2011 Document Reviewed: 10/01/2007 [employer] Patient Information 2013 [employer].  

## 2012-01-29 ENCOUNTER — Encounter: Payer: Self-pay | Admitting: Family Medicine

## 2012-01-29 NOTE — Assessment & Plan Note (Signed)
Found Ritalin 30 mg mildly helpful, no concerning side effects. Will try the long acting version. Reassess in 3 weeks or sooner as needed
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
## 2012-01-29 NOTE — Assessment & Plan Note (Signed)
Good weight loss since last visit, enocuraged DASH diet and continued exercise.

## 2012-02-03 ENCOUNTER — Other Ambulatory Visit: Payer: PRIVATE HEALTH INSURANCE

## 2012-02-03 ENCOUNTER — Ambulatory Visit: Payer: PRIVATE HEALTH INSURANCE | Admitting: Women's Health

## 2012-02-17 ENCOUNTER — Encounter: Payer: Self-pay | Admitting: Family Medicine

## 2012-02-17 ENCOUNTER — Ambulatory Visit (INDEPENDENT_AMBULATORY_CARE_PROVIDER_SITE_OTHER): Payer: PRIVATE HEALTH INSURANCE | Admitting: Family Medicine

## 2012-02-17 VITALS — BP 129/84 | HR 94 | Temp 98.4°F | Ht 64.0 in | Wt 155.0 lb

## 2012-02-17 DIAGNOSIS — Q244 Congenital subaortic stenosis: Secondary | ICD-10-CM

## 2012-02-17 DIAGNOSIS — F988 Other specified behavioral and emotional disorders with onset usually occurring in childhood and adolescence: Secondary | ICD-10-CM

## 2012-02-17 MED ORDER — METHYLPHENIDATE HCL 10 MG PO TABS: 10.0000 mg | ORAL_TABLET | Freq: Every day | ORAL | Status: DC | PRN

## 2012-02-17 MED ORDER — METHYLPHENIDATE HCL ER (LA) 30 MG PO CP24: 30.0000 mg | ORAL_CAPSULE | ORAL | Status: DC

## 2012-02-17 NOTE — Assessment & Plan Note (Signed)
Improved response to Ritalin LA 30 mg in am and Ritalin 10 mg in pm, no concerning side effects. Given refills today and will reevaluate in 6 weeks or sooner as needed

## 2012-02-17 NOTE — Patient Instructions (Addendum)
Attention Deficit Hyperactivity Disorder Attention deficit hyperactivity disorder (ADHD) is a problem with behavior issues based on the way the brain functions (neurobehavioral disorder). It is a common reason for behavior and academic problems in school. CAUSES  The cause of ADHD is unknown in most cases. It may run in families. It sometimes can be associated with learning disabilities and other behavioral problems. SYMPTOMS  There are 3 types of ADHD. The 3 types and some of the symptoms include:  Inattentive  Gets bored or distracted easily.  Loses or forgets things. Forgets to hand in homework.  Has trouble organizing or completing tasks.  Difficulty staying on task.  An inability to organize daily tasks and school work.  Leaving projects, chores, or homework unfinished.  Trouble paying attention or responding to details. Careless mistakes.  Difficulty following directions. Often seems like is not listening.  Dislikes activities that require sustained attention (like chores or homework).  Hyperactive-impulsive  Feels like it is impossible to sit still or stay in a seat. Fidgeting with hands and feet.  Trouble waiting turn.  Talking too much or out of turn. Interruptive.  Speaks or acts impulsively.  Aggressive, disruptive behavior.  Constantly busy or on the go, noisy.  Combined  Has symptoms of both of the above. Often children with ADHD feel discouraged about themselves and with school. They often perform well below their abilities in school. These symptoms can cause problems in home, school, and in relationships with peers. As children get older, the excess motor activities can calm down, but the problems with paying attention and staying organized persist. Most children do not outgrow ADHD but with good treatment can learn to cope with the symptoms. DIAGNOSIS  When ADHD is suspected, the diagnosis should be made by professionals trained in ADHD.  Diagnosis will  include:  Ruling out other reasons for the child's behavior.  The caregivers will check with the child's school and check their medical records.  They will talk to teachers and parents.  Behavior rating scales for the child will be filled out by those dealing with the child on a daily basis. A diagnosis is made only after all information has been considered. TREATMENT  Treatment usually includes behavioral treatment often along with medicines. It may include stimulant medicines. The stimulant medicines decrease impulsivity and hyperactivity and increase attention. Other medicines used include antidepressants and certain blood pressure medicines. Most experts agree that treatment for ADHD should address all aspects of the child's functioning. Treatment should not be limited to the use of medicines alone. Treatment should include structured classroom management. The parents must receive education to address rewarding good behavior, discipline, and limit-setting. Tutoring or behavioral therapy or both should be available for the child. If untreated, the disorder can have long-term serious effects into adolescence and adulthood. HOME CARE INSTRUCTIONS   Often with ADHD there is a lot of frustration among the family in dealing with the illness. There is often blame and anger that is not warranted. This is a life long illness. There is no way to prevent ADHD. In many cases, because the problem affects the family as a whole, the entire family may need help. A therapist can help the family find better ways to handle the disruptive behaviors and promote change. If the child is young, most of the therapist's work is with the parents. Parents will learn techniques for coping with and improving their child's behavior. Sometimes only the child with the ADHD needs counseling. Your caregivers can help   you make these decisions.  Children with ADHD may need help in organizing. Some helpful tips include:  Keep  routines the same every day from wake-up time to bedtime. Schedule everything. This includes homework and playtime. This should include outdoor and indoor recreation. Keep the schedule on the refrigerator or a bulletin board where it is frequently seen. Mark schedule changes as far in advance as possible.  Have a place for everything and keep everything in its place. This includes clothing, backpacks, and school supplies.  Encourage writing down assignments and bringing home needed books.  Offer your child a well-balanced diet. Breakfast is especially important for school performance. Children should avoid drinks with caffeine including:  Soft drinks.  Coffee.  Tea.  However, some older children (adolescents) may find these drinks helpful in improving their attention.  Children with ADHD need consistent rules that they can understand and follow. If rules are followed, give small rewards. Children with ADHD often receive, and expect, criticism. Look for good behavior and praise it. Set realistic goals. Give clear instructions. Look for activities that can foster success and self-esteem. Make time for pleasant activities with your child. Give lots of affection.  Parents are their children's greatest advocates. Learn as much as possible about ADHD. This helps you become a stronger and better advocate for your child. It also helps you educate your child's teachers and instructors if they feel inadequate in these areas. Parent support groups are often helpful. A national group with local chapters is called CHADD (Children and Adults with Attention Deficit Hyperactivity Disorder). PROGNOSIS  There is no cure for ADHD. Children with the disorder seldom outgrow it. Many find adaptive ways to accommodate the ADHD as they mature. SEEK MEDICAL CARE IF:  Your child has repeated muscle twitches, cough or speech outbursts.  Your child has sleep problems.  Your child has a marked loss of  appetite.  Your child develops depression.  Your child has new or worsening behavioral problems.  Your child develops dizziness.  Your child has a racing heart.  Your child has stomach pains.  Your child develops headaches. Document Released: 02/18/2002 Document Revised: 05/23/2011 Document Reviewed: 10/01/2007 [employer] Patient Information 2013 [employer].  

## 2012-02-17 NOTE — Assessment & Plan Note (Signed)
Has not had follow up with cardiology in years. Has been seen by [facility] cardiology in past and agrees to new consultation for ongoing surveillance, asymptomatic at this time
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
## 2012-03-29 ENCOUNTER — Telehealth: Payer: Self-pay

## 2012-03-29 NOTE — Telephone Encounter (Signed)
St John's Wort should not interact with any of her meds, so she is welcome to try it. If it does not work then she should come in

## 2012-03-29 NOTE — Telephone Encounter (Signed)
Pt called stating that she gets bad mood swings before her period and would like to know if she could take [facility] with her other medication? Pt advised that MD is out of the office and we will return her call tomorrow. Pt voiced understanding

## 2012-03-30 NOTE — Telephone Encounter (Signed)
Patient informed and voiced understanding

## 2012-05-04 ENCOUNTER — Ambulatory Visit: Payer: PRIVATE HEALTH INSURANCE | Admitting: Family Medicine

## 2012-05-11 ENCOUNTER — Ambulatory Visit (INDEPENDENT_AMBULATORY_CARE_PROVIDER_SITE_OTHER): Payer: PRIVATE HEALTH INSURANCE | Admitting: Family Medicine

## 2012-05-11 ENCOUNTER — Encounter: Payer: Self-pay | Admitting: Family Medicine

## 2012-05-11 VITALS — BP 142/86 | HR 97 | Temp 98.6°F | Ht 64.0 in | Wt 175.8 lb

## 2012-05-11 DIAGNOSIS — F329 Major depressive disorder, single episode, unspecified: Secondary | ICD-10-CM

## 2012-05-11 DIAGNOSIS — A048 Other specified bacterial intestinal infections: Secondary | ICD-10-CM

## 2012-05-11 DIAGNOSIS — R1013 Epigastric pain: Secondary | ICD-10-CM

## 2012-05-11 LAB — HEPATIC FUNCTION PANEL
ALT: 14 U/L (ref 0–35)
AST: 17 U/L (ref 0–37)
Bilirubin, Direct: 0.2 mg/dL (ref 0.0–0.3)
Total Bilirubin: 0.7 mg/dL (ref 0.3–1.2)

## 2012-05-11 LAB — CBC
RBC: 3.71 Mil/uL — ABNORMAL LOW (ref 3.87–5.11)
WBC: 6.8 10*3/uL (ref 4.5–10.5)

## 2012-05-11 LAB — TSH: TSH: 1.9 u[IU]/mL (ref 0.35–5.50)

## 2012-05-11 LAB — RENAL FUNCTION PANEL
CO2: 25 mEq/L (ref 19–32)
Calcium: 9.2 mg/dL (ref 8.4–10.5)
Potassium: 4 mEq/L (ref 3.5–5.1)
Sodium: 137 mEq/L (ref 135–145)

## 2012-05-11 MED ORDER — METHYLPHENIDATE HCL ER (LA) 10 MG PO CP24: 10.0000 mg | ORAL_CAPSULE | Freq: Two times a day (BID) | ORAL | Status: DC

## 2012-05-11 NOTE — Patient Instructions (Addendum)

## 2012-05-13 ENCOUNTER — Encounter: Payer: Self-pay | Admitting: Family Medicine

## 2012-05-13 DIAGNOSIS — A048 Other specified bacterial intestinal infections: Secondary | ICD-10-CM | POA: Insufficient documentation

## 2012-05-13 HISTORY — DX: Other specified bacterial intestinal infections: A04.8
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
## 2012-05-13 NOTE — Assessment & Plan Note (Signed)
Treat with Omeprazole, amoxicillin and Biaxin. Start a probiotic

## 2012-05-13 NOTE — Assessment & Plan Note (Signed)
Encouraged DASH diet and increase exercise. 

## 2012-05-15 MED ORDER — CLARITHROMYCIN 500 MG PO TABS: 500.0000 mg | ORAL_TABLET | Freq: Two times a day (BID) | ORAL | Status: DC

## 2012-05-15 MED ORDER — OMEPRAZOLE 20 MG PO CPDR: 20.0000 mg | DELAYED_RELEASE_CAPSULE | Freq: Two times a day (BID) | ORAL | Status: DC

## 2012-05-15 MED ORDER — AMOXICILLIN 500 MG PO TABS: 1000.0000 mg | ORAL_TABLET | Freq: Two times a day (BID) | ORAL | Status: DC

## 2012-05-15 NOTE — Progress Notes (Signed)
Patient ID: [name], female   DOB: [birth year], 42 y.o.   MRN: [id number] Patient struggling with depression and has failed SSRIs in past. Agrees to try Brintillex

## 2012-05-15 NOTE — Progress Notes (Signed)
Quick Note:  Patient Informed and voiced understanding.  RX sent ______ 

## 2012-05-15 NOTE — Addendum Note (Signed)
Addended by: Court Joy on: 05/15/2012 05:13 PM   Modules accepted: Orders

## 2012-05-17 MED ORDER — VORTIOXETINE HBR 20 MG PO TABS: 1.0000 | ORAL_TABLET | Freq: Every day | ORAL | Status: DC

## 2012-05-17 MED ORDER — VORTIOXETINE HBR 10 MG PO TABS: 1.0000 | ORAL_TABLET | Freq: Every day | ORAL | Status: DC

## 2012-05-17 NOTE — Progress Notes (Signed)
RX sent and detailed message left on patient vm.  Pt has another appt on 06-08-12

## 2012-05-17 NOTE — Addendum Note (Signed)
Addended by: Court Joy on: 05/17/2012 10:54 AM   Modules accepted: Orders

## 2012-05-18 ENCOUNTER — Ambulatory Visit: Payer: PRIVATE HEALTH INSURANCE | Admitting: Family Medicine

## 2012-06-05 ENCOUNTER — Telehealth: Payer: Self-pay | Admitting: *Deleted

## 2012-06-05 DIAGNOSIS — F988 Other specified behavioral and emotional disorders with onset usually occurring in childhood and adolescence: Secondary | ICD-10-CM

## 2012-06-05 NOTE — Telephone Encounter (Signed)
I tried to call patient back. Phone rang then just stopped. Will try to call patient again later.

## 2012-06-05 NOTE — Telephone Encounter (Signed)
Patient reports that she was last given Ritalin Rx for: 10 mg 24-hr capsules that cost her $42 at pharmacy & would like to know if she can return Rx [on Friday, when she is off work] and get replacement Rx for regular Ritalin 10 mg tablets [do not believe there is a standard 10 mg in capsules]/SLS Please advise.

## 2012-06-05 NOTE — Telephone Encounter (Signed)
She can have the short acting Ritalin tabs 10 mg bid but she has to know they wear off faster so she may have a dip in her energy and focus during the day. Shred the cap rx and she can have the tabs, will she come to OR to get this?

## 2012-06-06 ENCOUNTER — Other Ambulatory Visit: Payer: Self-pay | Admitting: Women's Health

## 2012-06-06 DIAGNOSIS — R102 Pelvic and perineal pain: Secondary | ICD-10-CM

## 2012-06-08 ENCOUNTER — Ambulatory Visit: Payer: PRIVATE HEALTH INSURANCE | Admitting: Family Medicine

## 2012-06-08 MED ORDER — METHYLPHENIDATE HCL 10 MG PO TABS: 10.0000 mg | ORAL_TABLET | Freq: Two times a day (BID) | ORAL | Status: DC

## 2012-06-08 NOTE — Telephone Encounter (Signed)
Nikki informed pt that RX could be picked up. Note from Dr Abner Greenspan printed with RX

## 2012-06-22 ENCOUNTER — Ambulatory Visit (INDEPENDENT_AMBULATORY_CARE_PROVIDER_SITE_OTHER): Payer: PRIVATE HEALTH INSURANCE | Admitting: Women's Health

## 2012-06-22 ENCOUNTER — Ambulatory Visit (INDEPENDENT_AMBULATORY_CARE_PROVIDER_SITE_OTHER): Payer: PRIVATE HEALTH INSURANCE

## 2012-06-22 DIAGNOSIS — N390 Urinary tract infection, site not specified: Secondary | ICD-10-CM

## 2012-06-22 DIAGNOSIS — N83209 Unspecified ovarian cyst, unspecified side: Secondary | ICD-10-CM

## 2012-06-22 DIAGNOSIS — R102 Pelvic and perineal pain: Secondary | ICD-10-CM

## 2012-06-22 DIAGNOSIS — N949 Unspecified condition associated with female genital organs and menstrual cycle: Secondary | ICD-10-CM

## 2012-06-22 LAB — URINALYSIS W MICROSCOPIC + REFLEX CULTURE
Casts: NONE SEEN
Ketones, ur: NEGATIVE mg/dL
Nitrite: NEGATIVE
Protein, ur: NEGATIVE mg/dL
pH: 5.5 (ref 5.0–8.0)

## 2012-06-22 MED ORDER — SULFAMETHOXAZOLE-TRIMETHOPRIM 800-160 MG PO TABS: 1.0000 | ORAL_TABLET | Freq: Two times a day (BID) | ORAL | Status: DC

## 2012-06-22 NOTE — Patient Instructions (Addendum)
Ovarian Cyst The ovaries are small organs that are on each side of the uterus. The ovaries are the organs that produce the female hormones, estrogen and progesterone. An ovarian cyst is a sac filled with fluid that can vary in its size. It is normal for a small cyst to form in women who are in the childbearing age and who have menstrual periods. This type of cyst is called a follicle cyst that becomes an ovulation cyst (corpus luteum cyst) after it produces the women's egg. It later goes away on its own if the woman does not become pregnant. There are other kinds of ovarian cysts that may cause problems and may need to be treated. The most serious problem is a cyst with cancer. It should be noted that menopausal women who have an ovarian cyst are at a higher risk of it being a cancer cyst. They should be evaluated very quickly, thoroughly and followed closely. This is especially true in menopausal women because of the high rate of ovarian cancer in women in menopause. CAUSES AND TYPES OF OVARIAN CYSTS:  FUNCTIONAL CYST: The follicle/corpus luteum cyst is a functional cyst that occurs every month during ovulation with the menstrual cycle. They go away with the next menstrual cycle if the woman does not get pregnant. Usually, there are no symptoms with a functional cyst.  ENDOMETRIOMA CYST: This cyst develops from the lining of the uterus tissue. This cyst gets in or on the ovary. It grows every month from the bleeding during the menstrual period. It is also called a "chocolate cyst" because it becomes filled with blood that turns brown. This cyst can cause pain in the lower abdomen during intercourse and with your menstrual period.  CYSTADENOMA CYST: This cyst develops from the cells on the outside of the ovary. They usually are not cancerous. They can get very big and cause lower abdomen pain and pain with intercourse. This type of cyst can twist on itself, cut off its blood supply and cause severe pain. It  also can easily rupture and cause a lot of pain.  DERMOID CYST: This type of cyst is sometimes found in both ovaries. They are found to have different kinds of body tissue in the cyst. The tissue includes skin, teeth, hair, and/or cartilage. They usually do not have symptoms unless they get very big. Dermoid cysts are rarely cancerous.  POLYCYSTIC OVARY: This is a rare condition with hormone problems that produces many small cysts on both ovaries. The cysts are follicle-like cysts that never produce an egg and become a corpus luteum. It can cause an increase in body weight, infertility, acne, increase in body and facial hair and lack of menstrual periods or rare menstrual periods. Many women with this problem develop type 2 diabetes. The exact cause of this problem is unknown. A polycystic ovary is rarely cancerous.  THECA LUTEIN CYST: Occurs when too much hormone (human chorionic gonadotropin) is produced and over-stimulates the ovaries to produce an egg. They are frequently seen when doctors stimulate the ovaries for invitro-fertilization (test tube babies).  LUTEOMA CYST: This cyst is seen during pregnancy. Rarely it can cause an obstruction to the birth canal during labor and delivery. They usually go away after delivery. SYMPTOMS   Pelvic pain or pressure.  Pain during sexual intercourse.  Increasing girth (swelling) of the abdomen.  Abnormal menstrual periods.  Increasing pain with menstrual periods.  You stop having menstrual periods and you are not pregnant. DIAGNOSIS  The diagnosis can   be made during:  Routine or annual pelvic examination (common).  Ultrasound.  X-ray of the pelvis.  CT Scan.  MRI.  Blood tests. TREATMENT   Treatment may only be to follow the cyst monthly for 2 to 3 months with your caregiver. Many go away on their own, especially functional cysts.  May be aspirated (drained) with a long needle with ultrasound, or by laparoscopy (inserting a tube into  the pelvis through a small incision).  The whole cyst can be removed by laparoscopy.  Sometimes the cyst may need to be removed through an incision in the lower abdomen.  Hormone treatment is sometimes used to help dissolve certain cysts.  Birth control pills are sometimes used to help dissolve certain cysts. HOME CARE INSTRUCTIONS  Follow your caregiver's advice regarding:  Medicine.  Follow up visits to evaluate and treat the cyst.  You may need to come back or make an appointment with another caregiver, to find the exact cause of your cyst, if your caregiver is not a gynecologist.  Get your yearly and recommended pelvic examinations and Pap tests.  Let your caregiver know if you have had an ovarian cyst in the past. SEEK MEDICAL CARE IF:   Your periods are late, irregular, they stop, or are painful.  Your stomach (abdomen) or pelvic pain does not go away.  Your stomach becomes larger or swollen.  You have pressure on your bladder or trouble emptying your bladder completely.  You have painful sexual intercourse.  You have feelings of fullness, pressure, or discomfort in your stomach.  You lose weight for no apparent reason.  You feel generally ill.  You become constipated.  You lose your appetite.  You develop acne.  You have an increase in body and facial hair.  You are gaining weight, without changing your exercise and eating habits.  You think you are pregnant. SEEK IMMEDIATE MEDICAL CARE IF:   You have increasing abdominal pain.  You feel sick to your stomach (nausea) and/or vomit.  You develop a fever that comes on suddenly.  You develop abdominal pain during a bowel movement.  Your menstrual periods become heavier than usual. Document Released: 02/28/2005 Document Revised: 05/23/2011 Document Reviewed: 01/01/2009 [employer] Patient Information 2013 [employer].  

## 2012-06-22 NOTE — Progress Notes (Signed)
Patient ID: [name], female   DOB: June 01, 1970, 42 y.o.   MRN: [id number] Presents for ultrasound due to abdominal bloating with right-sided discomfort. Also having increased urinary frequency with urgency and slight burning with urination. Denies vaginal discharge or fever.  Monthly cycle, not sexually active. Recently treated for H. Pylori infection, continues to struggle with depression managed by primary care.  Exam: Appears well, UA trace leukocytes, 21-50 WBCs, bacteria few. No CVAT Ultrasound: 2 small fibroids 8 x 6 mm, 10 x 7 mm. Left adnexa  negative history of LSO. 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  Left ovarian cyst UTI  Plan: Septra DS one twice daily for 3 days #6. Repeat ultrasound in 3 months, if cyst persists, possible cystectomy.

## 2012-06-25 LAB — URINE CULTURE: Colony Count: 75000

## 2012-07-06 ENCOUNTER — Ambulatory Visit (INDEPENDENT_AMBULATORY_CARE_PROVIDER_SITE_OTHER): Payer: PRIVATE HEALTH INSURANCE | Admitting: Family Medicine

## 2012-07-06 ENCOUNTER — Encounter: Payer: Self-pay | Admitting: Family Medicine

## 2012-07-06 VITALS — BP 128/85 | HR 73 | Temp 97.2°F | Ht 64.0 in | Wt 176.0 lb

## 2012-07-06 DIAGNOSIS — A048 Other specified bacterial intestinal infections: Secondary | ICD-10-CM

## 2012-07-06 DIAGNOSIS — N801 Endometriosis of ovary: Secondary | ICD-10-CM

## 2012-07-06 DIAGNOSIS — F988 Other specified behavioral and emotional disorders with onset usually occurring in childhood and adolescence: Secondary | ICD-10-CM

## 2012-07-06 DIAGNOSIS — R350 Frequency of micturition: Secondary | ICD-10-CM

## 2012-07-06 DIAGNOSIS — Z6825 Body mass index (BMI) 25.0-25.9, adult: Secondary | ICD-10-CM

## 2012-07-06 DIAGNOSIS — E663 Overweight: Secondary | ICD-10-CM

## 2012-07-06 DIAGNOSIS — Q244 Congenital subaortic stenosis: Secondary | ICD-10-CM

## 2012-07-06 DIAGNOSIS — K219 Gastro-esophageal reflux disease without esophagitis: Secondary | ICD-10-CM

## 2012-07-06 LAB — POCT URINALYSIS DIPSTICK
Blood, UA: NEGATIVE
Glucose, UA: NEGATIVE
Ketones, UA: NEGATIVE
Spec Grav, UA: 1.015
Urobilinogen, UA: 0.2

## 2012-07-06 MED ORDER — OMEPRAZOLE 20 MG PO CPDR: 20.0000 mg | DELAYED_RELEASE_CAPSULE | Freq: Two times a day (BID) | ORAL | Status: DC

## 2012-07-06 MED ORDER — AMPHETAMINE-DEXTROAMPHETAMINE 10 MG PO TABS: 10.0000 mg | ORAL_TABLET | Freq: Two times a day (BID) | ORAL | Status: DC

## 2012-07-08 ENCOUNTER — Encounter: Payer: Self-pay | Admitting: Family Medicine

## 2012-07-08 DIAGNOSIS — R350 Frequency of micturition: Secondary | ICD-10-CM | POA: Insufficient documentation

## 2012-07-08 DIAGNOSIS — N801 Endometriosis of ovary: Secondary | ICD-10-CM

## 2012-07-08 DIAGNOSIS — N80129 Deep endometriosis of ovary, unspecified ovary: Secondary | ICD-10-CM

## 2012-07-08 HISTORY — DX: Deep endometriosis of ovary, unspecified ovary: N80.129

## 2012-07-08 HISTORY — DX: Endometriosis of ovary: N80.1

## 2012-07-08 HISTORY — DX: Frequency of micturition: R35.0

## 2012-07-08 LAB — URINE CULTURE
Colony Count: NO GROWTH
Organism ID, Bacteria: NO GROWTH

## 2012-07-08 NOTE — Assessment & Plan Note (Signed)
Will try Adderall, reasess at next visit.

## 2012-07-08 NOTE — Assessment & Plan Note (Signed)
Tolerated treatment

## 2012-07-08 NOTE — Assessment & Plan Note (Signed)
Asymptomatic at this time 
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
## 2012-07-08 NOTE — Assessment & Plan Note (Signed)
On right, patient with bloating and abdominal discomfort. Would like a second opinion about her options. Will arrange consultation with a second gyn practice so she can hear her options

## 2012-07-08 NOTE — Assessment & Plan Note (Signed)
Encouraged DASH diet and increased exercise 

## 2012-07-08 NOTE — Assessment & Plan Note (Signed)
Urine culture negative.

## 2012-07-11 ENCOUNTER — Encounter: Payer: Self-pay | Admitting: Family Medicine

## 2012-07-12 ENCOUNTER — Telehealth: Payer: Self-pay | Admitting: *Deleted

## 2012-07-12 NOTE — Telephone Encounter (Signed)
[facility] office called about fax they received. They were unable to read the fax and need it resent. Thanks

## 2012-07-12 NOTE — Telephone Encounter (Signed)
Did you do a referral? I didn't send anything to them?

## 2012-08-15 ENCOUNTER — Other Ambulatory Visit: Payer: Self-pay

## 2012-08-15 DIAGNOSIS — F988 Other specified behavioral and emotional disorders with onset usually occurring in childhood and adolescence: Secondary | ICD-10-CM

## 2012-08-15 MED ORDER — AMPHETAMINE-DEXTROAMPHETAMINE 10 MG PO TABS: 10.0000 mg | ORAL_TABLET | Freq: Two times a day (BID) | ORAL | Status: DC

## 2012-08-15 NOTE — Telephone Encounter (Signed)
Pt left a message stating she was supposed to see MD this Friday but had another conflicting appt that she can't miss so she had to reschedule for 08-24-12. Pt stated she has been out of her Adderall for a week and doesn't want to go another week without this? Pt states she would like MD to refill this even if its only for 1 week?  Please advise? Last RX was wrote on 07-06-12 quantity 60 with 0 refills

## 2012-08-15 NOTE — Telephone Encounter (Signed)
Pt informed RX is ready. RX in front cabinet

## 2012-08-15 NOTE — Telephone Encounter (Signed)
Have signed the prescription

## 2012-08-17 ENCOUNTER — Ambulatory Visit: Payer: PRIVATE HEALTH INSURANCE | Admitting: Family Medicine

## 2012-08-24 ENCOUNTER — Ambulatory Visit: Payer: PRIVATE HEALTH INSURANCE | Admitting: Family Medicine

## 2012-09-07 ENCOUNTER — Ambulatory Visit: Payer: PRIVATE HEALTH INSURANCE | Admitting: Family Medicine

## 2012-09-21 ENCOUNTER — Encounter: Payer: Self-pay | Admitting: Women's Health

## 2012-09-21 ENCOUNTER — Ambulatory Visit (INDEPENDENT_AMBULATORY_CARE_PROVIDER_SITE_OTHER): Payer: PRIVATE HEALTH INSURANCE | Admitting: Women's Health

## 2012-09-21 ENCOUNTER — Ambulatory Visit (INDEPENDENT_AMBULATORY_CARE_PROVIDER_SITE_OTHER): Payer: PRIVATE HEALTH INSURANCE

## 2012-09-21 DIAGNOSIS — N912 Amenorrhea, unspecified: Secondary | ICD-10-CM

## 2012-09-21 DIAGNOSIS — N83209 Unspecified ovarian cyst, unspecified side: Secondary | ICD-10-CM

## 2012-09-21 LAB — PREGNANCY, URINE: Preg Test, Ur: NEGATIVE

## 2012-09-21 MED ORDER — MEDROXYPROGESTERONE ACETATE 10 MG PO TABS: ORAL_TABLET | ORAL | Status: DC

## 2012-09-21 NOTE — Progress Notes (Signed)
Patient ID: [name], female   DOB: Apr 05, 1970, 42 y.o.   MRN: [id number] Presents for followup ultrasound for questionable endometrioma. Has complaint of low back discomfort, abdominal bloating, RLQ discomfort, lack of energy, generally not feeling well. Denies vaginal discharge, urinary symptoms, fever, nausea. LMP 6/26 and states would feel better if had cycle. 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 History of appendectomy.  Exam: Appears well, no CVAT, abdomen soft no rebound. Ultrasound: 2 small fibroids 9X 6, 8 x 5 mm.left adnexa normal. Right ovarian complex thickwalled avascular 22 x 19 x 18 mm.  06/2012 - 58 x 32 x 42 mm.  U PT negative.  Resolving right ovarian cyst General malaise Breast cancer 2005 with lumpectomy, radiation and chemotherapy History of depression on no medication at this time  Plan: Condoms encouraged,  Provera 10 for 5 days, prescription, proper use given and reviewed. Instructed to call if no cycle or if bloating does not resolve. Has followup with primary care. Encouraged to continue counseling, possibly needing medication in the future.

## 2012-09-25 ENCOUNTER — Ambulatory Visit (INDEPENDENT_AMBULATORY_CARE_PROVIDER_SITE_OTHER): Payer: PRIVATE HEALTH INSURANCE | Admitting: Family Medicine

## 2012-09-25 ENCOUNTER — Encounter: Payer: Self-pay | Admitting: Family Medicine

## 2012-09-25 ENCOUNTER — Telehealth: Payer: Self-pay

## 2012-09-25 VITALS — BP 122/64 | HR 90 | Temp 98.5°F | Ht 64.0 in | Wt 178.1 lb

## 2012-09-25 DIAGNOSIS — F988 Other specified behavioral and emotional disorders with onset usually occurring in childhood and adolescence: Secondary | ICD-10-CM

## 2012-09-25 DIAGNOSIS — Z6825 Body mass index (BMI) 25.0-25.9, adult: Secondary | ICD-10-CM

## 2012-09-25 DIAGNOSIS — K219 Gastro-esophageal reflux disease without esophagitis: Secondary | ICD-10-CM

## 2012-09-25 DIAGNOSIS — N801 Endometriosis of ovary: Secondary | ICD-10-CM

## 2012-09-25 DIAGNOSIS — E663 Overweight: Secondary | ICD-10-CM

## 2012-09-25 DIAGNOSIS — A048 Other specified bacterial intestinal infections: Secondary | ICD-10-CM

## 2012-09-25 MED ORDER — AMPHETAMINE-DEXTROAMPHETAMINE 20 MG PO TABS: 20.0000 mg | ORAL_TABLET | Freq: Two times a day (BID) | ORAL | Status: DC

## 2012-09-25 MED ORDER — FAMOTIDINE 20 MG PO TABS: 20.0000 mg | ORAL_TABLET | Freq: Two times a day (BID) | ORAL | Status: DC | PRN

## 2012-09-25 NOTE — Assessment & Plan Note (Signed)
Is exercising regularly and is frustrated with lack of weight loss, offered encouragement to maintain dietary and exercise changes and reevaluate at next visit

## 2012-09-25 NOTE — Patient Instructions (Addendum)
Increase Famotidine to twice a day and add 2 Tums at bedtime  Attention Deficit Hyperactivity Disorder Attention deficit hyperactivity disorder (ADHD) is a problem with behavior issues based on the way the brain functions (neurobehavioral disorder). It is a common reason for behavior and academic problems in school. CAUSES  The cause of ADHD is unknown in most cases. It may run in families. It sometimes can be associated with learning disabilities and other behavioral problems. SYMPTOMS  There are 3 types of ADHD. The 3 types and some of the symptoms include:  Inattentive  Gets bored or distracted easily.  Loses or forgets things. Forgets to hand in homework.  Has trouble organizing or completing tasks.  Difficulty staying on task.  An inability to organize daily tasks and school work.  Leaving projects, chores, or homework unfinished.  Trouble paying attention or responding to details. Careless mistakes.  Difficulty following directions. Often seems like is not listening.  Dislikes activities that require sustained attention (like chores or homework).  Hyperactive-impulsive  Feels like it is impossible to sit still or stay in a seat. Fidgeting with hands and feet.  Trouble waiting turn.  Talking too much or out of turn. Interruptive.  Speaks or acts impulsively.  Aggressive, disruptive behavior.  Constantly busy or on the go, noisy.  Combined  Has symptoms of both of the above. Often children with ADHD feel discouraged about themselves and with school. They often perform well below their abilities in school. These symptoms can cause problems in home, school, and in relationships with peers. As children get older, the excess motor activities can calm down, but the problems with paying attention and staying organized persist. Most children do not outgrow ADHD but with good treatment can learn to cope with the symptoms. DIAGNOSIS  When ADHD is suspected, the diagnosis  should be made by professionals trained in ADHD.  Diagnosis will include:  Ruling out other reasons for the child's behavior.  The caregivers will check with the child's school and check their medical records.  They will talk to teachers and parents.  Behavior rating scales for the child will be filled out by those dealing with the child on a daily basis. A diagnosis is made only after all information has been considered. TREATMENT  Treatment usually includes behavioral treatment often along with medicines. It may include stimulant medicines. The stimulant medicines decrease impulsivity and hyperactivity and increase attention. Other medicines used include antidepressants and certain blood pressure medicines. Most experts agree that treatment for ADHD should address all aspects of the child's functioning. Treatment should not be limited to the use of medicines alone. Treatment should include structured classroom management. The parents must receive education to address rewarding good behavior, discipline, and limit-setting. Tutoring or behavioral therapy or both should be available for the child. If untreated, the disorder can have long-term serious effects into adolescence and adulthood. HOME CARE INSTRUCTIONS   Often with ADHD there is a lot of frustration among the family in dealing with the illness. There is often blame and anger that is not warranted. This is a life long illness. There is no way to prevent ADHD. In many cases, because the problem affects the family as a whole, the entire family may need help. A therapist can help the family find better ways to handle the disruptive behaviors and promote change. If the child is young, most of the therapist's work is with the parents. Parents will learn techniques for coping with and improving their child's behavior.  Sometimes only the child with the ADHD needs counseling. Your caregivers can help you make these decisions.  Children with ADHD  may need help in organizing. Some helpful tips include:  Keep routines the same every day from wake-up time to bedtime. Schedule everything. This includes homework and playtime. This should include outdoor and indoor recreation. Keep the schedule on the refrigerator or a bulletin board where it is frequently seen. Mark schedule changes as far in advance as possible.  Have a place for everything and keep everything in its place. This includes clothing, backpacks, and school supplies.  Encourage writing down assignments and bringing home needed books.  Offer your child a well-balanced diet. Breakfast is especially important for school performance. Children should avoid drinks with caffeine including:  Soft drinks.  Coffee.  Tea.  However, some older children (adolescents) may find these drinks helpful in improving their attention.  Children with ADHD need consistent rules that they can understand and follow. If rules are followed, give small rewards. Children with ADHD often receive, and expect, criticism. Look for good behavior and praise it. Set realistic goals. Give clear instructions. Look for activities that can foster success and self-esteem. Make time for pleasant activities with your child. Give lots of affection.  Parents are their children's greatest advocates. Learn as much as possible about ADHD. This helps you become a stronger and better advocate for your child. It also helps you educate your child's teachers and instructors if they feel inadequate in these areas. Parent support groups are often helpful. A national group with local chapters is called CHADD (Children and Adults with Attention Deficit Hyperactivity Disorder). PROGNOSIS  There is no cure for ADHD. Children with the disorder seldom outgrow it. Many find adaptive ways to accommodate the ADHD as they mature. SEEK MEDICAL CARE IF:  Your child has repeated muscle twitches, cough or speech outbursts.  Your child has  sleep problems.  Your child has a marked loss of appetite.  Your child develops depression.  Your child has new or worsening behavioral problems.  Your child develops dizziness.  Your child has a racing heart.  Your child has stomach pains.  Your child develops headaches. Document Released: 02/18/2002 Document Revised: 05/23/2011 Document Reviewed: 10/01/2007 [facility] Patient Information 2014 [place], [place].

## 2012-09-25 NOTE — Assessment & Plan Note (Signed)
Continue probiotic, may need testing to prove clearance if symptomw do not improve

## 2012-09-25 NOTE — Assessment & Plan Note (Signed)
Worse recently increase Famotidine to 20 mg bid, avoid offending foods and add 2 Tums qhs
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
## 2012-09-25 NOTE — Telephone Encounter (Signed)
Message copied by Court Joy on Tue Sep 25, 2012  1:44 PM ------      Message from: Danise Edge A      Created: Tue Sep 25, 2012  1:42 PM       Just gave her a rx for Adderall 20 mg po bid rx, due to her heart condition have her just take 1/2 tab po tid til I see her again to assess symptoms, she should increase slowly since this is a stimulant ------

## 2012-09-25 NOTE — Assessment & Plan Note (Addendum)
Adderall helps some but not fully no concerning side effects. Increase Adderall to 20 mg tabs 1/2 tab po tid prn and reassess in one month

## 2012-09-25 NOTE — Assessment & Plan Note (Signed)
As been struggling with increased abdominal and back discomfort and irregular menses, is following with gyn and is taking progesterone at present

## 2012-10-26 ENCOUNTER — Encounter: Payer: Self-pay | Admitting: Family Medicine

## 2012-10-26 ENCOUNTER — Ambulatory Visit (INDEPENDENT_AMBULATORY_CARE_PROVIDER_SITE_OTHER): Payer: PRIVATE HEALTH INSURANCE | Admitting: Family Medicine

## 2012-10-26 VITALS — BP 116/78 | HR 80 | Temp 98.3°F | Ht 64.0 in | Wt 178.0 lb

## 2012-10-26 DIAGNOSIS — F988 Other specified behavioral and emotional disorders with onset usually occurring in childhood and adolescence: Secondary | ICD-10-CM

## 2012-10-26 DIAGNOSIS — Z6825 Body mass index (BMI) 25.0-25.9, adult: Secondary | ICD-10-CM

## 2012-10-26 DIAGNOSIS — E663 Overweight: Secondary | ICD-10-CM

## 2012-10-26 DIAGNOSIS — Q244 Congenital subaortic stenosis: Secondary | ICD-10-CM

## 2012-10-26 MED ORDER — AMPHETAMINE-DEXTROAMPHETAMINE 20 MG PO TABS: 20.0000 mg | ORAL_TABLET | Freq: Two times a day (BID) | ORAL | Status: DC

## 2012-10-26 NOTE — Patient Instructions (Signed)
Consider Turmeric if neck persists Try Salon Pas cream or patch for neck pain    Back Pain, Adult Low back pain is very common. About 1 in 5 people have back pain.The cause of low back pain is rarely dangerous. The pain often gets better over time.About half of people with a sudden onset of back pain feel better in just 2 weeks. About 8 in 10 people feel better by 6 weeks.  CAUSES Some common causes of back pain include:  Strain of the muscles or ligaments supporting the spine.  Wear and tear (degeneration) of the spinal discs.  Arthritis.  Direct injury to the back. DIAGNOSIS Most of the time, the direct cause of low back pain is not known.However, back pain can be treated effectively even when the exact cause of the pain is unknown.Answering your caregiver's questions about your overall health and symptoms is one of the most accurate ways to make sure the cause of your pain is not dangerous. If your caregiver needs more information, he or she may order lab work or imaging tests (X-rays or MRIs).However, even if imaging tests show changes in your back, this usually does not require surgery. HOME CARE INSTRUCTIONS For many people, back pain returns.Since low back pain is rarely dangerous, it is often a condition that people can learn to [facility] their own.   Remain active. It is stressful on the back to sit or stand in one place. Do not sit, drive, or stand in one place for more than 30 minutes at a time. Take short walks on level surfaces as soon as pain allows.Try to increase the length of time you walk each day.  Do not stay in bed.Resting more than 1 or 2 days can delay your recovery.  Do not avoid exercise or work.Your body is made to move.It is not dangerous to be active, even though your back may hurt.Your back will likely heal faster if you return to being active before your pain is gone.  Pay attention to your body when you bend and lift. Many people have less  discomfortwhen lifting if they bend their knees, keep the load close to their bodies,and avoid twisting. Often, the most comfortable positions are those that put less stress on your recovering back.  Find a comfortable position to sleep. Use a firm mattress and lie on your side with your knees slightly bent. If you lie on your back, put a pillow under your knees.  Only take over-the-counter or prescription medicines as directed by your caregiver. Over-the-counter medicines to reduce pain and inflammation are often the most helpful.Your caregiver may prescribe muscle relaxant drugs.These medicines help dull your pain so you can more quickly return to your normal activities and healthy exercise.  Put ice on the injured area.  Put ice in a plastic bag.  Place a towel between your skin and the bag.  Leave the ice on for 15-20 minutes, 3-4 times a day for the first 2 to 3 days. After that, ice and heat may be alternated to reduce pain and spasms.  Ask your caregiver about trying back exercises and gentle massage. This may be of some benefit.  Avoid feeling anxious or stressed.Stress increases muscle tension and can worsen back pain.It is important to recognize when you are anxious or stressed and learn ways to manage it.Exercise is a great option. SEEK MEDICAL CARE IF:  You have pain that is not relieved with rest or medicine.  You have pain that does not  improve in 1 week.  You have new symptoms.  You are generally not feeling well. SEEK IMMEDIATE MEDICAL CARE IF:   You have pain that radiates from your back into your legs.  You develop new bowel or bladder control problems.  You have unusual weakness or numbness in your arms or legs.  You develop nausea or vomiting.  You develop abdominal pain.  You feel faint. Document Released: 02/28/2005 Document Revised: 08/30/2011 Document Reviewed: 07/19/2010 [facility] Patient Information 2014 [place], [place].

## 2012-10-28 ENCOUNTER — Encounter: Payer: Self-pay | Admitting: Family Medicine

## 2012-10-28 NOTE — Assessment & Plan Note (Signed)
adderall is working well at 20 mg po bid. Allowed refills today, no concerning side effects

## 2012-10-28 NOTE — Assessment & Plan Note (Signed)
Has had echo and cardiac monitoring recently with good results
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
## 2012-10-28 NOTE — Assessment & Plan Note (Signed)
Good appetite suppression on Adderal, has been eating better and exercising.

## 2012-11-01 ENCOUNTER — Telehealth: Payer: Self-pay | Admitting: Family Medicine

## 2012-11-01 NOTE — Telephone Encounter (Signed)
Received medical records from [facility]  P: [phone] F: [phone]

## 2012-12-12 ENCOUNTER — Other Ambulatory Visit: Payer: Self-pay

## 2012-12-12 DIAGNOSIS — Z1231 Encounter for screening mammogram for malignant neoplasm of breast: Secondary | ICD-10-CM

## 2012-12-12 DIAGNOSIS — Z853 Personal history of malignant neoplasm of breast: Secondary | ICD-10-CM

## 2013-01-01 ENCOUNTER — Ambulatory Visit
Admission: RE | Admit: 2013-01-01 | Discharge: 2013-01-01 | Disposition: A | Discharge status: Home / Self Care | Payer: PRIVATE HEALTH INSURANCE | Source: Ambulatory Visit

## 2013-01-01 DIAGNOSIS — Z853 Personal history of malignant neoplasm of breast: Secondary | ICD-10-CM

## 2013-01-01 DIAGNOSIS — Z1231 Encounter for screening mammogram for malignant neoplasm of breast: Secondary | ICD-10-CM

## 2013-01-07 ENCOUNTER — Other Ambulatory Visit: Payer: Self-pay | Admitting: Women's Health

## 2013-01-07 DIAGNOSIS — R928 Other abnormal and inconclusive findings on diagnostic imaging of breast: Secondary | ICD-10-CM

## 2013-01-17 ENCOUNTER — Other Ambulatory Visit: Payer: Self-pay

## 2013-01-28 ENCOUNTER — Ambulatory Visit
Admission: RE | Admit: 2013-01-28 | Discharge: 2013-01-28 | Disposition: A | Discharge status: Home / Self Care | Payer: PRIVATE HEALTH INSURANCE | Source: Ambulatory Visit | Attending: Women's Health | Admitting: Women's Health

## 2013-01-28 DIAGNOSIS — R928 Other abnormal and inconclusive findings on diagnostic imaging of breast: Secondary | ICD-10-CM

## 2013-01-30 ENCOUNTER — Telehealth: Payer: Self-pay

## 2013-01-30 DIAGNOSIS — F988 Other specified behavioral and emotional disorders with onset usually occurring in childhood and adolescence: Secondary | ICD-10-CM

## 2013-01-30 MED ORDER — AMPHETAMINE-DEXTROAMPHETAMINE 20 MG PO TABS: 20.0000 mg | ORAL_TABLET | Freq: Two times a day (BID) | ORAL | Status: DC

## 2013-01-30 NOTE — Telephone Encounter (Signed)
Pt called stating she needs her refills on her Adderall? Pts last RX was 10-26-12 for Aug, Sept and Oct.  Please advise refill?

## 2013-01-30 NOTE — Telephone Encounter (Signed)
RX's printed and put in front cabinet.   Message left on vm

## 2013-01-30 NOTE — Telephone Encounter (Signed)
She can have 3 months worth but then she needs to come in to get more Adderal

## 2013-03-05 ENCOUNTER — Telehealth: Payer: Self-pay

## 2013-03-05 NOTE — Telephone Encounter (Signed)
Patient left a message stating that the Adderall RX for 12-14 wasn't signed by MD? Pt is going to bring this rx up for MD to sign

## 2013-03-25 ENCOUNTER — Encounter: Payer: Self-pay | Admitting: General Surgery

## 2013-03-25 DIAGNOSIS — R002 Palpitations: Secondary | ICD-10-CM | POA: Insufficient documentation

## 2013-04-12 ENCOUNTER — Ambulatory Visit: Payer: PRIVATE HEALTH INSURANCE | Admitting: Cardiology

## 2013-05-03 ENCOUNTER — Ambulatory Visit (INDEPENDENT_AMBULATORY_CARE_PROVIDER_SITE_OTHER): Payer: PRIVATE HEALTH INSURANCE | Admitting: Family Medicine

## 2013-05-03 ENCOUNTER — Encounter: Payer: Self-pay | Admitting: Family Medicine

## 2013-05-03 ENCOUNTER — Other Ambulatory Visit: Payer: Self-pay | Admitting: *Deleted

## 2013-05-03 VITALS — BP 116/78 | HR 86 | Temp 98.1°F | Resp 16 | Ht 64.0 in | Wt 181.5 lb

## 2013-05-03 DIAGNOSIS — N946 Dysmenorrhea, unspecified: Secondary | ICD-10-CM

## 2013-05-03 DIAGNOSIS — F419 Anxiety disorder, unspecified: Secondary | ICD-10-CM

## 2013-05-03 DIAGNOSIS — R002 Palpitations: Secondary | ICD-10-CM

## 2013-05-03 DIAGNOSIS — K219 Gastro-esophageal reflux disease without esophagitis: Secondary | ICD-10-CM

## 2013-05-03 DIAGNOSIS — R040 Epistaxis: Secondary | ICD-10-CM

## 2013-05-03 DIAGNOSIS — Z0000 Encounter for general adult medical examination without abnormal findings: Secondary | ICD-10-CM

## 2013-05-03 DIAGNOSIS — F341 Dysthymic disorder: Secondary | ICD-10-CM

## 2013-05-03 DIAGNOSIS — Q244 Congenital subaortic stenosis: Secondary | ICD-10-CM

## 2013-05-03 DIAGNOSIS — F329 Major depressive disorder, single episode, unspecified: Secondary | ICD-10-CM

## 2013-05-03 DIAGNOSIS — F988 Other specified behavioral and emotional disorders with onset usually occurring in childhood and adolescence: Secondary | ICD-10-CM

## 2013-05-03 DIAGNOSIS — E663 Overweight: Secondary | ICD-10-CM

## 2013-05-03 LAB — RENAL FUNCTION PANEL
ALBUMIN: 4.8 g/dL (ref 3.5–5.2)
BUN: 13 mg/dL (ref 6–23)
CO2: 28 mEq/L (ref 19–32)
CREATININE: 0.77 mg/dL (ref 0.50–1.10)
Calcium: 10 mg/dL (ref 8.4–10.5)
Chloride: 99 mEq/L (ref 96–112)
GLUCOSE: 89 mg/dL (ref 70–99)
Phosphorus: 3.7 mg/dL (ref 2.3–4.6)
Potassium: 4.1 mEq/L (ref 3.5–5.3)
SODIUM: 136 meq/L (ref 135–145)

## 2013-05-03 LAB — HEPATIC FUNCTION PANEL
ALT: 21 U/L (ref 0–35)
AST: 20 U/L (ref 0–37)
Albumin: 4.8 g/dL (ref 3.5–5.2)
Alkaline Phosphatase: 33 U/L — ABNORMAL LOW (ref 39–117)
Bilirubin, Direct: 0.1 mg/dL (ref 0.0–0.3)
Indirect Bilirubin: 0.4 mg/dL (ref 0.2–1.2)
TOTAL PROTEIN: 7.7 g/dL (ref 6.0–8.3)
Total Bilirubin: 0.5 mg/dL (ref 0.2–1.2)

## 2013-05-03 LAB — LIPID PANEL
Cholesterol: 242 mg/dL — ABNORMAL HIGH (ref 0–200)
HDL: 60 mg/dL (ref >39)
LDL CALC: 159 mg/dL — AB (ref 0–99)
Total CHOL/HDL Ratio: 4 Ratio
Triglycerides: 113 mg/dL (ref <150)
VLDL: 23 mg/dL (ref 0–40)

## 2013-05-03 LAB — CBC
HCT: 38.9 % (ref 36.0–46.0)
Hemoglobin: 13.9 g/dL (ref 12.0–15.0)
MCH: 33.6 pg (ref 26.0–34.0)
MCHC: 35.7 g/dL (ref 30.0–36.0)
MCV: 94 fL (ref 78.0–100.0)
PLATELETS: 290 10*3/uL (ref 150–400)
RBC: 4.14 MIL/uL (ref 3.87–5.11)
RDW: 12.8 % (ref 11.5–15.5)
WBC: 6.5 10*3/uL (ref 4.0–10.5)

## 2013-05-03 MED ORDER — AMPHETAMINE-DEXTROAMPHETAMINE 20 MG PO TABS: 20.0000 mg | ORAL_TABLET | Freq: Two times a day (BID) | ORAL | Status: DC

## 2013-05-03 MED ORDER — ESCITALOPRAM OXALATE 10 MG PO TABS: 5.0000 mg | ORAL_TABLET | Freq: Every day | ORAL | Status: DC

## 2013-05-03 MED ORDER — MUPIROCIN 2 % EX OINT: 1.0000 "application " | TOPICAL_OINTMENT | Freq: Every day | CUTANEOUS | Status: DC

## 2013-05-03 MED ORDER — IBUPROFEN 600 MG PO TABS: 600.0000 mg | ORAL_TABLET | Freq: Three times a day (TID) | ORAL | Status: DC | PRN

## 2013-05-03 MED ORDER — ALPRAZOLAM 0.25 MG PO TABS: 0.2500 mg | ORAL_TABLET | Freq: Two times a day (BID) | ORAL | Status: DC | PRN

## 2013-05-03 NOTE — Patient Instructions (Signed)
Gastroesophageal Reflux Disease, Adult  Gastroesophageal reflux disease (GERD) happens when acid from your stomach flows up into the esophagus. When acid comes in contact with the esophagus, the acid causes soreness (inflammation) in the esophagus. Over time, GERD may create small holes (ulcers) in the lining of the esophagus.  CAUSES   · Increased body weight. This puts pressure on the stomach, making acid rise from the stomach into the esophagus.  · Smoking. This increases acid production in the stomach.  · Drinking alcohol. This causes decreased pressure in the lower esophageal sphincter (valve or ring of muscle between the esophagus and stomach), allowing acid from the stomach into the esophagus.  · Late evening meals and a full stomach. This increases pressure and acid production in the stomach.  · A malformed lower esophageal sphincter.  Sometimes, no cause is found.  SYMPTOMS   · Burning pain in the lower part of the mid-chest behind the breastbone and in the mid-stomach area. This may occur twice a week or more often.  · Trouble swallowing.  · Sore throat.  · Dry cough.  · Asthma-like symptoms including chest tightness, shortness of breath, or wheezing.  DIAGNOSIS   Your caregiver may be able to diagnose GERD based on your symptoms. In some cases, X-rays and other tests may be done to check for complications or to check the condition of your stomach and esophagus.  TREATMENT   Your caregiver may recommend over-the-counter or prescription medicines to help decrease acid production. Ask your caregiver before starting or adding any new medicines.   HOME CARE INSTRUCTIONS   · Change the factors that you can control. Ask your caregiver for guidance concerning weight loss, quitting smoking, and alcohol consumption.  · Avoid foods and drinks that make your symptoms worse, such as:  · Caffeine or alcoholic drinks.  · Chocolate.  · Peppermint or mint flavorings.  · Garlic and onions.  · Spicy foods.  · Citrus fruits,  such as oranges, lemons, or limes.  · Tomato-based foods such as sauce, chili, salsa, and pizza.  · Fried and fatty foods.  · Avoid lying down for the 3 hours prior to your bedtime or prior to taking a nap.  · Eat small, frequent meals instead of large meals.  · Wear loose-fitting clothing. Do not wear anything tight around your waist that causes pressure on your stomach.  · Raise the head of your bed 6 to 8 inches with wood blocks to help you sleep. Extra pillows will not help.  · Only take over-the-counter or prescription medicines for pain, discomfort, or fever as directed by your caregiver.  · Do not take aspirin, ibuprofen, or other nonsteroidal anti-inflammatory drugs (NSAIDs).  SEEK IMMEDIATE MEDICAL CARE IF:   · You have pain in your arms, neck, jaw, teeth, or back.  · Your pain increases or changes in intensity or duration.  · You develop nausea, vomiting, or sweating (diaphoresis).  · You develop shortness of breath, or you faint.  · Your vomit is green, yellow, black, or looks like coffee grounds or blood.  · Your stool is red, bloody, or black.  These symptoms could be signs of other problems, such as heart disease, gastric bleeding, or esophageal bleeding.  MAKE SURE YOU:   · Understand these instructions.  · Will watch your condition.  · Will get help right away if you are not doing well or get worse.  Document Released: 12/08/2004 Document Revised: 05/23/2011 Document Reviewed: 09/17/2010  [employer]® Patient   Information ©2014 [employer].

## 2013-05-03 NOTE — Progress Notes (Signed)
Pre visit review using our clinic review tool, if applicable. No additional management support is needed unless otherwise documented below in the visit note/SLS  

## 2013-05-03 NOTE — Progress Notes (Signed)
Rx[s] forwarded to PCP for signature and to be given to patient during OV/SLS

## 2013-05-04 LAB — TSH: TSH: 2.864 u[IU]/mL (ref 0.350–4.500)

## 2013-05-05 DIAGNOSIS — F419 Anxiety disorder, unspecified: Secondary | ICD-10-CM

## 2013-05-05 DIAGNOSIS — F329 Major depressive disorder, single episode, unspecified: Secondary | ICD-10-CM | POA: Insufficient documentation

## 2013-05-05 DIAGNOSIS — N946 Dysmenorrhea, unspecified: Secondary | ICD-10-CM | POA: Insufficient documentation

## 2013-05-05 DIAGNOSIS — F32A Depression, unspecified: Secondary | ICD-10-CM | POA: Insufficient documentation

## 2013-05-05 DIAGNOSIS — R040 Epistaxis: Secondary | ICD-10-CM | POA: Insufficient documentation

## 2013-05-05 NOTE — Assessment & Plan Note (Signed)
Encouraged dash diet and exercise as tolerated

## 2013-05-05 NOTE — Assessment & Plan Note (Signed)
Start Lexapro 5 mg po daily

## 2013-05-05 NOTE — Assessment & Plan Note (Signed)
Doing well, encouraged heart healthy diet and regular exercise. Needs adequate sleep. Performed annual labs.

## 2013-05-05 NOTE — Assessment & Plan Note (Signed)
Following with cardiology doing well. Has had recent visit with cardiology, asymmptomatic

## 2013-05-05 NOTE — Assessment & Plan Note (Signed)
Encouraged Ibuprofen early in cycle

## 2013-05-05 NOTE — Assessment & Plan Note (Signed)
Improved with meds and probiotics

## 2013-05-05 NOTE — Assessment & Plan Note (Signed)
Try Bactroban to nares qhs if no improvement will need to see ENT

## 2013-05-05 NOTE — Assessment & Plan Note (Signed)
Does feel the Adderall helps her concentrate but is questioning whether or not it increases her irritability may need to adjust meds if persists.
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
## 2013-05-05 NOTE — Assessment & Plan Note (Signed)
No episodes recently.  °

## 2013-05-14 ENCOUNTER — Other Ambulatory Visit (HOSPITAL_COMMUNITY): Payer: PRIVATE HEALTH INSURANCE

## 2013-08-21 ENCOUNTER — Telehealth: Payer: Self-pay | Admitting: Family Medicine

## 2013-08-21 ENCOUNTER — Other Ambulatory Visit: Payer: Self-pay | Admitting: Family Medicine

## 2013-08-21 MED ORDER — AMPHETAMINE-DEXTROAMPHETAMINE 20 MG PO TABS: 20.0000 mg | ORAL_TABLET | Freq: Two times a day (BID) | ORAL | Status: DC

## 2013-08-21 NOTE — Telephone Encounter (Signed)
Signed prescription

## 2013-08-21 NOTE — Telephone Encounter (Signed)
Pt informed that rx's are at front desk

## 2013-08-21 NOTE — Telephone Encounter (Signed)
Please advise refill? Last RX was wrote on 05-03-13 for refill on 06-29-13 quantity 60 with 0 refills

## 2013-08-21 NOTE — Telephone Encounter (Signed)
Patient is requesting a new adderall rx

## 2013-10-03 ENCOUNTER — Encounter: Payer: Self-pay | Admitting: Family Medicine

## 2013-10-03 ENCOUNTER — Ambulatory Visit (INDEPENDENT_AMBULATORY_CARE_PROVIDER_SITE_OTHER): Payer: PRIVATE HEALTH INSURANCE | Admitting: Family Medicine

## 2013-10-03 VITALS — BP 120/80 | HR 101 | Temp 98.7°F | Ht 64.0 in | Wt 193.4 lb

## 2013-10-03 DIAGNOSIS — F341 Dysthymic disorder: Secondary | ICD-10-CM

## 2013-10-03 DIAGNOSIS — F419 Anxiety disorder, unspecified: Principal | ICD-10-CM

## 2013-10-03 DIAGNOSIS — F988 Other specified behavioral and emotional disorders with onset usually occurring in childhood and adolescence: Secondary | ICD-10-CM

## 2013-10-03 DIAGNOSIS — E669 Obesity, unspecified: Secondary | ICD-10-CM

## 2013-10-03 DIAGNOSIS — F329 Major depressive disorder, single episode, unspecified: Secondary | ICD-10-CM

## 2013-10-03 DIAGNOSIS — K219 Gastro-esophageal reflux disease without esophagitis: Secondary | ICD-10-CM

## 2013-10-03 MED ORDER — ESCITALOPRAM OXALATE 10 MG PO TABS: 10.0000 mg | ORAL_TABLET | Freq: Every day | ORAL | Status: DC

## 2013-10-03 MED ORDER — PHENTERMINE HCL 15 MG PO CAPS: 15.0000 mg | ORAL_CAPSULE | ORAL | Status: DC

## 2013-10-03 MED ORDER — TOPIRAMATE 25 MG PO TABS: 25.0000 mg | ORAL_TABLET | Freq: Two times a day (BID) | ORAL | Status: DC

## 2013-10-03 NOTE — Progress Notes (Signed)
Pre visit review using our clinic review tool, if applicable. No additional management support is needed unless otherwise documented below in the visit note. 

## 2013-10-03 NOTE — Patient Instructions (Signed)
Exercise to Lose Weight Exercise and a healthy diet may help you lose weight. Your doctor may suggest specific exercises. EXERCISE IDEAS AND TIPS  Choose low-cost things you enjoy doing, such as walking, bicycling, or exercising to workout videos.  Take stairs instead of the elevator.  Walk during your lunch break.  Park your car further away from work or school.  Go to a gym or an exercise class.  Start with 5 to 10 minutes of exercise each day. Build up to 30 minutes of exercise 4 to 6 days a week.  Wear shoes with good support and comfortable clothes.  Stretch before and after working out.  Work out until you breathe harder and your heart beats faster.  Drink extra water when you exercise.  Do not do so much that you hurt yourself, feel dizzy, or get very short of breath. Exercises that burn about 150 calories:  Running 1  miles in 15 minutes.  Playing volleyball for 45 to 60 minutes.  Washing and waxing a car for 45 to 60 minutes.  Playing touch football for 45 minutes.  Walking 1  miles in 35 minutes.  Pushing a stroller 1  miles in 30 minutes.  Playing basketball for 30 minutes.  Raking leaves for 30 minutes.  Bicycling 5 miles in 30 minutes.  Walking 2 miles in 30 minutes.  Dancing for 30 minutes.  Shoveling snow for 15 minutes.  Swimming laps for 20 minutes.  Walking up stairs for 15 minutes.  Bicycling 4 miles in 15 minutes.  Gardening for 30 to 45 minutes.  Jumping rope for 15 minutes.  Washing windows or floors for 45 to 60 minutes. Document Released: 04/02/2010 Document Revised: 05/23/2011 Document Reviewed: 04/02/2010 ExitCare Patient Information 2015 [employer]. This information is not intended to replace advice given to you by your health care provider. Make sure you discuss any questions you have with your health care provider.  

## 2013-10-04 ENCOUNTER — Encounter: Payer: Self-pay | Admitting: Family Medicine
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
## 2013-10-04 NOTE — Assessment & Plan Note (Signed)
Encouraged DASH diet, decrease po intake and increase exercise as tolerated. Needs 7-8 hours of sleep nightly. Avoid trans fats, eat small, frequent meals every 4-5 hours with lean proteins, complex carbs and healthy fats. Minimize simple carbs, GMO foods. Responded to Phentermine in past but cannot stay on Hi dose, will restart at 15 mg with Topamax. Will wait to start Phentermine when done with Adderall. Warned regarding side effects

## 2013-10-04 NOTE — Assessment & Plan Note (Signed)
No response to Lexapro with 5 mg will try increasing to 10 mg daily and reevaluate at next visit

## 2013-10-04 NOTE — Assessment & Plan Note (Signed)
Did not have a good response to Adderall so will stop for now

## 2013-10-04 NOTE — Assessment & Plan Note (Signed)
Avoid offending foods, start probiotics. Do not eat large meals in late evening and consider raising head of bed.  

## 2013-11-05 ENCOUNTER — Encounter: Payer: Self-pay | Admitting: Family Medicine

## 2013-11-05 ENCOUNTER — Ambulatory Visit (INDEPENDENT_AMBULATORY_CARE_PROVIDER_SITE_OTHER): Payer: PRIVATE HEALTH INSURANCE | Admitting: Family Medicine

## 2013-11-05 VITALS — BP 110/76 | HR 85 | Temp 98.4°F | Ht 64.0 in | Wt 192.0 lb

## 2013-11-05 DIAGNOSIS — E669 Obesity, unspecified: Secondary | ICD-10-CM

## 2013-11-05 DIAGNOSIS — F341 Dysthymic disorder: Secondary | ICD-10-CM

## 2013-11-05 DIAGNOSIS — F329 Major depressive disorder, single episode, unspecified: Secondary | ICD-10-CM

## 2013-11-05 DIAGNOSIS — K219 Gastro-esophageal reflux disease without esophagitis: Secondary | ICD-10-CM

## 2013-11-05 DIAGNOSIS — F988 Other specified behavioral and emotional disorders with onset usually occurring in childhood and adolescence: Secondary | ICD-10-CM

## 2013-11-05 DIAGNOSIS — F32A Depression, unspecified: Secondary | ICD-10-CM

## 2013-11-05 DIAGNOSIS — F419 Anxiety disorder, unspecified: Secondary | ICD-10-CM

## 2013-11-05 MED ORDER — PHENTERMINE HCL 30 MG PO CAPS: 30.0000 mg | ORAL_CAPSULE | ORAL | Status: DC

## 2013-11-05 MED ORDER — OMEPRAZOLE 40 MG PO CPDR: 40.0000 mg | DELAYED_RELEASE_CAPSULE | Freq: Every day | ORAL | Status: DC

## 2013-11-05 MED ORDER — PHENTERMINE HCL 37.5 MG PO CAPS: 37.5000 mg | ORAL_CAPSULE | ORAL | Status: DC

## 2013-11-05 NOTE — Progress Notes (Signed)
Pre visit review using our clinic review tool, if applicable. No additional management support is needed unless otherwise documented below in the visit note. 

## 2013-11-05 NOTE — Patient Instructions (Signed)
DASH Eating Plan °DASH stands for "Dietary Approaches to Stop Hypertension." The DASH eating plan is a healthy eating plan that has been shown to reduce high blood pressure (hypertension). Additional health benefits may include reducing the risk of type 2 diabetes mellitus, heart disease, and stroke. The DASH eating plan may also help with weight loss. °WHAT DO I NEED TO KNOW ABOUT THE DASH EATING PLAN? °For the DASH eating plan, you will follow these general guidelines: °· Choose foods with a percent daily value for sodium of less than 5% (as listed on the food label). °· Use salt-free seasonings or herbs instead of table salt or sea salt. °· Check with your health care provider or pharmacist before using salt substitutes. °· Eat lower-sodium products, often labeled as "lower sodium" or "no salt added." °· Eat fresh foods. °· Eat more vegetables, fruits, and low-fat dairy products. °· Choose whole grains. Look for the word "whole" as the first word in the ingredient list. °· Choose fish and skinless chicken or turkey more often than red meat. Limit fish, poultry, and meat to 6 oz (170 g) each day. °· Limit sweets, desserts, sugars, and sugary drinks. °· Choose heart-healthy fats. °· Limit cheese to 1 oz (28 g) per day. °· Eat more home-cooked food and less restaurant, buffet, and fast food. °· Limit fried foods. °· Cook foods using methods other than frying. °· Limit canned vegetables. If you do use them, rinse them well to decrease the sodium. °· When eating at a restaurant, ask that your food be prepared with less salt, or no salt if possible. °WHAT FOODS CAN I EAT? °Seek help from a dietitian for individual calorie needs. °Grains °Whole grain or whole wheat bread. Brown rice. Whole grain or whole wheat pasta. Quinoa, bulgur, and whole grain cereals. Low-sodium cereals. Corn or whole wheat flour tortillas. Whole grain cornbread. Whole grain crackers. Low-sodium crackers. °Vegetables °Fresh or frozen vegetables  (raw, steamed, roasted, or grilled). Low-sodium or reduced-sodium tomato and vegetable juices. Low-sodium or reduced-sodium tomato sauce and paste. Low-sodium or reduced-sodium canned vegetables.  °Fruits °All fresh, canned (in natural juice), or frozen fruits. °Meat and Other Protein Products °Ground beef (85% or leaner), grass-fed beef, or beef trimmed of fat. Skinless chicken or turkey. Ground chicken or turkey. Pork trimmed of fat. All fish and seafood. Eggs. Dried beans, peas, or lentils. Unsalted nuts and seeds. Unsalted canned beans. °Dairy °Low-fat dairy products, such as skim or 1% milk, 2% or reduced-fat cheeses, low-fat ricotta or cottage cheese, or plain low-fat yogurt. Low-sodium or reduced-sodium cheeses. °Fats and Oils °Tub margarines without trans fats. Light or reduced-fat mayonnaise and salad dressings (reduced sodium). Avocado. Safflower, olive, or canola oils. Natural peanut or almond butter. °Other °Unsalted popcorn and pretzels. °The items listed above may not be a complete list of recommended foods or beverages. Contact your dietitian for more options. °WHAT FOODS ARE NOT RECOMMENDED? °Grains °White bread. White pasta. White rice. Refined cornbread. Bagels and croissants. Crackers that contain trans fat. °Vegetables °Creamed or fried vegetables. Vegetables in a cheese sauce. Regular canned vegetables. Regular canned tomato sauce and paste. Regular tomato and vegetable juices. °Fruits °Dried fruits. Canned fruit in light or heavy syrup. Fruit juice. °Meat and Other Protein Products °Fatty cuts of meat. Ribs, chicken wings, bacon, sausage, bologna, salami, chitterlings, fatback, hot dogs, bratwurst, and packaged luncheon meats. Salted nuts and seeds. Canned beans with salt. °Dairy °Whole or 2% milk, cream, half-and-half, and cream cheese. Whole-fat or sweetened yogurt. Full-fat   cheeses or blue cheese. Nondairy creamers and whipped toppings. Processed cheese, cheese spreads, or cheese  curds. °Condiments °Onion and garlic salt, seasoned salt, table salt, and sea salt. Canned and packaged gravies. Worcestershire sauce. Tartar sauce. Barbecue sauce. Teriyaki sauce. Soy sauce, including reduced sodium. Steak sauce. Fish sauce. Oyster sauce. Cocktail sauce. Horseradish. Ketchup and mustard. Meat flavorings and tenderizers. Bouillon cubes. Hot sauce. Tabasco sauce. Marinades. Taco seasonings. Relishes. °Fats and Oils °Butter, stick margarine, lard, shortening, ghee, and bacon fat. Coconut, palm kernel, or palm oils. Regular salad dressings. °Other °Pickles and olives. Salted popcorn and pretzels. °The items listed above may not be a complete list of foods and beverages to avoid. Contact your dietitian for more information. °WHERE CAN I FIND MORE INFORMATION? °[employer], [employer], [employer]: www.nhlbi.nih.gov/health/health-topics/topics/dash/ °Document Released: 02/17/2011 Document Revised: 07/15/2013 Document Reviewed: 01/02/2013 °ExitCare® Patient Information ©2015 [employer]. This information is not intended to replace advice given to you by your health care provider. Make sure you discuss any questions you have with your health care provider. ° °

## 2013-11-06 ENCOUNTER — Encounter: Payer: Self-pay | Admitting: Family Medicine
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
## 2013-11-06 NOTE — Assessment & Plan Note (Addendum)
Avoid offending foods, start probiotics. Do not eat large meals in late evening and consider raising head of bed. Started on Omeprazole

## 2013-11-06 NOTE — Assessment & Plan Note (Signed)
Doing well on current meds, no changes

## 2013-11-06 NOTE — Assessment & Plan Note (Signed)
Encouraged DASH diet, decrease po intake and increase exercise as tolerated. Needs 7-8 hours of sleep nightly. Avoid trans fats, eat small, frequent meals every 4-5 hours with lean proteins, complex carbs and healthy fats. Minimize simple carbs, GMO foods. Tolerated Phentermine and Topamax without any concerning side effects. Is requesting an increase in dosing. She is aware of possible side effects and will stop dosing if any concerns. Reassess next month

## 2013-11-06 NOTE — Assessment & Plan Note (Signed)
Not needing any meds at this time.

## 2013-11-29 ENCOUNTER — Encounter: Payer: Self-pay | Admitting: Family Medicine

## 2013-12-02 ENCOUNTER — Telehealth: Payer: Self-pay

## 2013-12-02 MED ORDER — AMPHETAMINE-DEXTROAMPHETAMINE 20 MG PO TABS: 20.0000 mg | ORAL_TABLET | Freq: Two times a day (BID) | ORAL | Status: DC

## 2013-12-02 NOTE — Telephone Encounter (Signed)
Cancelled RX of phentermine with pharmacist

## 2013-12-02 NOTE — Telephone Encounter (Signed)
RX printed per MD on phone message

## 2013-12-24 ENCOUNTER — Ambulatory Visit: Payer: PRIVATE HEALTH INSURANCE | Admitting: Family Medicine

## 2013-12-25 ENCOUNTER — Encounter: Payer: Self-pay | Admitting: Family Medicine

## 2013-12-26 MED ORDER — AMPHETAMINE-DEXTROAMPHETAMINE 20 MG PO TABS: 20.0000 mg | ORAL_TABLET | Freq: Two times a day (BID) | ORAL | Status: DC

## 2014-01-16 ENCOUNTER — Encounter: Payer: Self-pay | Admitting: Family Medicine

## 2014-01-16 ENCOUNTER — Ambulatory Visit (INDEPENDENT_AMBULATORY_CARE_PROVIDER_SITE_OTHER): Payer: PRIVATE HEALTH INSURANCE | Admitting: Family Medicine

## 2014-01-16 VITALS — BP 126/73 | HR 80 | Temp 98.4°F | Ht 64.0 in | Wt 202.4 lb

## 2014-01-16 DIAGNOSIS — F329 Major depressive disorder, single episode, unspecified: Secondary | ICD-10-CM

## 2014-01-16 DIAGNOSIS — M25579 Pain in unspecified ankle and joints of unspecified foot: Secondary | ICD-10-CM

## 2014-01-16 DIAGNOSIS — M779 Enthesopathy, unspecified: Secondary | ICD-10-CM

## 2014-01-16 DIAGNOSIS — E785 Hyperlipidemia, unspecified: Secondary | ICD-10-CM

## 2014-01-16 DIAGNOSIS — F419 Anxiety disorder, unspecified: Secondary | ICD-10-CM

## 2014-01-16 DIAGNOSIS — F418 Other specified anxiety disorders: Secondary | ICD-10-CM

## 2014-01-16 DIAGNOSIS — K219 Gastro-esophageal reflux disease without esophagitis: Secondary | ICD-10-CM

## 2014-01-16 DIAGNOSIS — F32A Depression, unspecified: Secondary | ICD-10-CM

## 2014-01-16 DIAGNOSIS — E669 Obesity, unspecified: Secondary | ICD-10-CM

## 2014-01-16 DIAGNOSIS — IMO0002 Reserved for concepts with insufficient information to code with codable children: Secondary | ICD-10-CM

## 2014-01-16 DIAGNOSIS — Q244 Congenital subaortic stenosis: Secondary | ICD-10-CM

## 2014-01-16 HISTORY — DX: Hyperlipidemia, unspecified: E78.5

## 2014-01-16 HISTORY — DX: Pain in unspecified ankle and joints of unspecified foot: M25.579

## 2014-01-16 LAB — RENAL FUNCTION PANEL
Albumin: 3.4 g/dL — ABNORMAL LOW (ref 3.5–5.2)
BUN: 14 mg/dL (ref 6–23)
CO2: 26 meq/L (ref 19–32)
Calcium: 9.1 mg/dL (ref 8.4–10.5)
Chloride: 102 mEq/L (ref 96–112)
Creatinine, Ser: 0.8 mg/dL (ref 0.4–1.2)
GFR: 87.73 mL/min (ref >60.00)
GLUCOSE: 95 mg/dL (ref 70–99)
POTASSIUM: 4 meq/L (ref 3.5–5.1)
Phosphorus: 3 mg/dL (ref 2.3–4.6)
Sodium: 134 mEq/L — ABNORMAL LOW (ref 135–145)

## 2014-01-16 LAB — LIPID PANEL
CHOL/HDL RATIO: 5
Cholesterol: 197 mg/dL (ref 0–200)
HDL: 37.1 mg/dL — AB (ref >39.00)
LDL CALC: 124 mg/dL — AB (ref 0–99)
NonHDL: 159.9
TRIGLYCERIDES: 182 mg/dL — AB (ref 0.0–149.0)
VLDL: 36.4 mg/dL (ref 0.0–40.0)

## 2014-01-16 LAB — HEPATIC FUNCTION PANEL
ALT: 22 U/L (ref 0–35)
AST: 19 U/L (ref 0–37)
Albumin: 3.4 g/dL — ABNORMAL LOW (ref 3.5–5.2)
Alkaline Phosphatase: 32 U/L — ABNORMAL LOW (ref 39–117)
Bilirubin, Direct: 0 mg/dL (ref 0.0–0.3)
Total Bilirubin: 0.6 mg/dL (ref 0.2–1.2)
Total Protein: 7.1 g/dL (ref 6.0–8.3)

## 2014-01-16 LAB — CBC
HCT: 36.3 % (ref 36.0–46.0)
Hemoglobin: 12.3 g/dL (ref 12.0–15.0)
MCHC: 33.9 g/dL (ref 30.0–36.0)
MCV: 96.4 fl (ref 78.0–100.0)
Platelets: 229 10*3/uL (ref 150.0–400.0)
RBC: 3.77 Mil/uL — AB (ref 3.87–5.11)
RDW: 12.3 % (ref 11.5–15.5)
WBC: 5.8 10*3/uL (ref 4.0–10.5)

## 2014-01-16 LAB — TSH: TSH: 3.06 u[IU]/mL (ref 0.35–4.50)

## 2014-01-16 LAB — SEDIMENTATION RATE: Sed Rate: 11 mm/hr (ref 0–22)

## 2014-01-16 MED ORDER — MELOXICAM 15 MG PO TABS: 15.0000 mg | ORAL_TABLET | Freq: Every day | ORAL | Status: DC

## 2014-01-16 MED ORDER — AMPHETAMINE-DEXTROAMPHETAMINE 20 MG PO TABS: 20.0000 mg | ORAL_TABLET | Freq: Two times a day (BID) | ORAL | Status: DC
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
## 2014-01-16 NOTE — Progress Notes (Signed)
Pre visit review using our clinic review tool, if applicable. No additional management support is needed unless otherwise documented below in the visit note. 

## 2014-01-16 NOTE — Patient Instructions (Addendum)

## 2014-01-17 LAB — RHEUMATOID FACTOR

## 2014-01-17 LAB — ANA: ANA: NEGATIVE

## 2014-01-19 NOTE — Assessment & Plan Note (Signed)
Encouraged DASH diet, decrease po intake and increase exercise as tolerated. Needs 7-8 hours of sleep nightly. Avoid trans fats, eat small, frequent meals every 4-5 hours with lean proteins, complex carbs and healthy fats. Minimize simple carbs, GMO foods. 

## 2014-01-19 NOTE — Assessment & Plan Note (Signed)
Doing well on current meds, no changes 

## 2014-01-19 NOTE — Assessment & Plan Note (Signed)
Avoid offending foods, start probiotics. Do not eat large meals in late evening and consider raising head of bed.  

## 2014-01-19 NOTE — Assessment & Plan Note (Addendum)
Encouraged good foot wear, orthotics, ice and topical treatments. Referred to podiatry.

## 2014-01-19 NOTE — Assessment & Plan Note (Signed)
Encouraged heart healthy diet, increase exercise, avoid trans fats, consider a krill oil cap daily 

## 2014-01-19 NOTE — Assessment & Plan Note (Signed)
Follows with cardiology, is asymptomatic.

## 2014-02-15 ENCOUNTER — Encounter: Payer: Self-pay | Admitting: Family Medicine

## 2014-02-15 DIAGNOSIS — F329 Major depressive disorder, single episode, unspecified: Secondary | ICD-10-CM

## 2014-02-15 DIAGNOSIS — F419 Anxiety disorder, unspecified: Principal | ICD-10-CM

## 2014-02-17 MED ORDER — ESCITALOPRAM OXALATE 10 MG PO TABS: 10.0000 mg | ORAL_TABLET | Freq: Every day | ORAL | Status: DC

## 2014-02-20 MED ORDER — ESCITALOPRAM OXALATE 10 MG PO TABS: 10.0000 mg | ORAL_TABLET | Freq: Every day | ORAL | Status: DC

## 2014-02-20 NOTE — Addendum Note (Signed)
Addended by: Varney Daily on: 02/20/2014 09:48 AM   Modules accepted: Orders

## 2014-04-18 ENCOUNTER — Ambulatory Visit (INDEPENDENT_AMBULATORY_CARE_PROVIDER_SITE_OTHER): Payer: Self-pay | Admitting: Family Medicine

## 2014-04-18 ENCOUNTER — Encounter: Payer: Self-pay | Admitting: Family Medicine

## 2014-04-18 VITALS — BP 135/82 | HR 81 | Temp 98.6°F | Ht 64.0 in | Wt 204.0 lb

## 2014-04-18 DIAGNOSIS — E669 Obesity, unspecified: Secondary | ICD-10-CM

## 2014-04-18 DIAGNOSIS — R002 Palpitations: Secondary | ICD-10-CM

## 2014-04-18 DIAGNOSIS — F419 Anxiety disorder, unspecified: Principal | ICD-10-CM

## 2014-04-18 DIAGNOSIS — F988 Other specified behavioral and emotional disorders with onset usually occurring in childhood and adolescence: Secondary | ICD-10-CM

## 2014-04-18 DIAGNOSIS — F418 Other specified anxiety disorders: Secondary | ICD-10-CM

## 2014-04-18 DIAGNOSIS — F909 Attention-deficit hyperactivity disorder, unspecified type: Secondary | ICD-10-CM

## 2014-04-18 DIAGNOSIS — F329 Major depressive disorder, single episode, unspecified: Secondary | ICD-10-CM

## 2014-04-18 DIAGNOSIS — K219 Gastro-esophageal reflux disease without esophagitis: Secondary | ICD-10-CM

## 2014-04-18 DIAGNOSIS — F32A Depression, unspecified: Secondary | ICD-10-CM

## 2014-04-18 MED ORDER — OMEPRAZOLE 40 MG PO CPDR: 40.0000 mg | DELAYED_RELEASE_CAPSULE | Freq: Every day | ORAL | Status: DC

## 2014-04-18 MED ORDER — ESCITALOPRAM OXALATE 10 MG PO TABS: 10.0000 mg | ORAL_TABLET | Freq: Every day | ORAL | Status: DC

## 2014-04-18 MED ORDER — BUPROPION HCL ER (XL) 150 MG PO TB24: 150.0000 mg | ORAL_TABLET | Freq: Every day | ORAL | Status: DC

## 2014-04-18 MED ORDER — AMPHETAMINE-DEXTROAMPHETAMINE 20 MG PO TABS: 20.0000 mg | ORAL_TABLET | Freq: Two times a day (BID) | ORAL | Status: DC

## 2014-04-18 NOTE — Patient Instructions (Signed)
[place] for weight loss, look up    Exercise to Lose Weight Exercise and a healthy diet may help you lose weight. Your doctor may suggest specific exercises. EXERCISE IDEAS AND TIPS  Choose low-cost things you enjoy doing, such as walking, bicycling, or exercising to workout videos.  Take stairs instead of the elevator.  Walk during your lunch break.  Park your car further away from work or school.  Go to a gym or an exercise class.  Start with 5 to 10 minutes of exercise each day. Build up to 30 minutes of exercise 4 to 6 days a week.  Wear shoes with good support and comfortable clothes.  Stretch before and after working out.  Work out until you breathe harder and your heart beats faster.  Drink extra water when you exercise.  Do not do so much that you hurt yourself, feel dizzy, or get very short of breath. Exercises that burn about 150 calories:  Running 1  miles in 15 minutes.  Playing volleyball for 45 to 60 minutes.  Washing and waxing a car for 45 to 60 minutes.  Playing touch football for 45 minutes.  Walking 1  miles in 35 minutes.  Pushing a stroller 1  miles in 30 minutes.  Playing basketball for 30 minutes.  Raking leaves for 30 minutes.  Bicycling 5 miles in 30 minutes.  Walking 2 miles in 30 minutes.  Dancing for 30 minutes.  Shoveling snow for 15 minutes.  Swimming laps for 20 minutes.  Walking up stairs for 15 minutes.  Bicycling 4 miles in 15 minutes.  Gardening for 30 to 45 minutes.  Jumping rope for 15 minutes.  Washing windows or floors for 45 to 60 minutes. Document Released: 04/02/2010 Document Revised: 05/23/2011 Document Reviewed: 04/02/2010 [facility] Patient Information 2015 [place], [place]. This information is not intended to replace advice given to you by your health care provider. Make sure you discuss any questions you have with your health care provider.

## 2014-04-18 NOTE — Progress Notes (Signed)
Pre visit review using our clinic review tool, if applicable. No additional management support is needed unless otherwise documented below in the visit note. 
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
## 2014-04-27 NOTE — Assessment & Plan Note (Signed)
Avoid offending foods, take probiotics. Do not eat large meals in late evening and consider raising head of bed.  

## 2014-04-27 NOTE — Assessment & Plan Note (Signed)
No recent episodes. Minimize caffeine, stress.

## 2014-04-27 NOTE — Assessment & Plan Note (Signed)
May continue Bupropion and add Lexapr which her sister has done well on.

## 2014-04-27 NOTE — Assessment & Plan Note (Signed)
Encouraged DASH diet, decrease po intake and increase exercise as tolerated. Needs 7-8 hours of sleep nightly. Avoid trans fats, eat small, frequent meals every 4-5 hours with lean proteins, complex carbs and healthy fats. Minimize simple carbs. Dicussed importance of small frequent, healthy mealse

## 2014-04-27 NOTE — Assessment & Plan Note (Addendum)
May continue Adderal, given prescription

## 2014-07-14 ENCOUNTER — Ambulatory Visit (INDEPENDENT_AMBULATORY_CARE_PROVIDER_SITE_OTHER): Payer: PRIVATE HEALTH INSURANCE | Admitting: Family Medicine

## 2014-07-14 ENCOUNTER — Encounter: Payer: Self-pay | Admitting: Family Medicine

## 2014-07-14 VITALS — BP 130/80 | HR 86 | Temp 98.2°F | Ht 64.0 in | Wt 205.0 lb

## 2014-07-14 DIAGNOSIS — K219 Gastro-esophageal reflux disease without esophagitis: Secondary | ICD-10-CM | POA: Diagnosis not present

## 2014-07-14 DIAGNOSIS — R002 Palpitations: Secondary | ICD-10-CM

## 2014-07-14 DIAGNOSIS — N946 Dysmenorrhea, unspecified: Secondary | ICD-10-CM

## 2014-07-14 DIAGNOSIS — E782 Mixed hyperlipidemia: Secondary | ICD-10-CM | POA: Diagnosis not present

## 2014-07-14 DIAGNOSIS — R635 Abnormal weight gain: Secondary | ICD-10-CM

## 2014-07-14 DIAGNOSIS — F909 Attention-deficit hyperactivity disorder, unspecified type: Secondary | ICD-10-CM

## 2014-07-14 DIAGNOSIS — F988 Other specified behavioral and emotional disorders with onset usually occurring in childhood and adolescence: Secondary | ICD-10-CM

## 2014-07-14 DIAGNOSIS — E785 Hyperlipidemia, unspecified: Secondary | ICD-10-CM

## 2014-07-14 DIAGNOSIS — M542 Cervicalgia: Secondary | ICD-10-CM

## 2014-07-14 DIAGNOSIS — M25579 Pain in unspecified ankle and joints of unspecified foot: Secondary | ICD-10-CM

## 2014-07-14 DIAGNOSIS — Q244 Congenital subaortic stenosis: Secondary | ICD-10-CM

## 2014-07-14 DIAGNOSIS — I38 Endocarditis, valve unspecified: Secondary | ICD-10-CM

## 2014-07-14 DIAGNOSIS — E669 Obesity, unspecified: Secondary | ICD-10-CM

## 2014-07-14 LAB — CBC
HCT: 36.3 % (ref 36.0–46.0)
Hemoglobin: 12.6 g/dL (ref 12.0–15.0)
MCHC: 34.7 g/dL (ref 30.0–36.0)
MCV: 94.7 fl (ref 78.0–100.0)
Platelets: 264 10*3/uL (ref 150.0–400.0)
RBC: 3.83 Mil/uL — ABNORMAL LOW (ref 3.87–5.11)
RDW: 12.8 % (ref 11.5–15.5)
WBC: 6 10*3/uL (ref 4.0–10.5)

## 2014-07-14 LAB — COMPREHENSIVE METABOLIC PANEL
ALT: 29 U/L (ref 0–35)
AST: 26 U/L (ref 0–37)
Albumin: 3.9 g/dL (ref 3.5–5.2)
Alkaline Phosphatase: 37 U/L — ABNORMAL LOW (ref 39–117)
BUN: 11 mg/dL (ref 6–23)
CO2: 25 meq/L (ref 19–32)
CREATININE: 0.82 mg/dL (ref 0.40–1.20)
Calcium: 9.1 mg/dL (ref 8.4–10.5)
Chloride: 104 mEq/L (ref 96–112)
GFR: 80.19 mL/min (ref >60.00)
GLUCOSE: 96 mg/dL (ref 70–99)
POTASSIUM: 3.7 meq/L (ref 3.5–5.1)
SODIUM: 139 meq/L (ref 135–145)
TOTAL PROTEIN: 7.1 g/dL (ref 6.0–8.3)
Total Bilirubin: 0.7 mg/dL (ref 0.2–1.2)

## 2014-07-14 LAB — LIPID PANEL
CHOLESTEROL: 189 mg/dL (ref 0–200)
HDL: 34.9 mg/dL — ABNORMAL LOW (ref >39.00)
NonHDL: 154.1
TRIGLYCERIDES: 244 mg/dL — AB (ref 0.0–149.0)
Total CHOL/HDL Ratio: 5
VLDL: 48.8 mg/dL — AB (ref 0.0–40.0)

## 2014-07-14 LAB — LDL CHOLESTEROL, DIRECT: LDL DIRECT: 118 mg/dL

## 2014-07-14 LAB — T4, FREE: Free T4: 0.83 ng/dL (ref 0.60–1.60)

## 2014-07-14 LAB — T3, FREE: T3, Free: 3.5 pg/mL (ref 2.3–4.2)

## 2014-07-14 LAB — TSH: TSH: 2.81 u[IU]/mL (ref 0.35–4.50)

## 2014-07-14 MED ORDER — IBUPROFEN 600 MG PO TABS: 600.0000 mg | ORAL_TABLET | Freq: Three times a day (TID) | ORAL | Status: DC | PRN

## 2014-07-14 MED ORDER — AMPHETAMINE-DEXTROAMPHET ER 20 MG PO CP24: 20.0000 mg | ORAL_CAPSULE | ORAL | Status: DC

## 2014-07-14 MED ORDER — OMEPRAZOLE 40 MG PO CPDR: 40.0000 mg | DELAYED_RELEASE_CAPSULE | Freq: Every day | ORAL | Status: DC

## 2014-07-14 MED ORDER — AMPHETAMINE-DEXTROAMPHETAMINE 20 MG PO TABS
20.0000 mg | ORAL_TABLET | Freq: Two times a day (BID) | ORAL | Status: DC
Start: 2014-07-14 — End: 2014-10-23

## 2014-07-14 MED ORDER — BUPROPION HCL ER (XL) 150 MG PO TB24: 150.0000 mg | ORAL_TABLET | Freq: Every day | ORAL | Status: DC

## 2014-07-14 MED ORDER — AMPHETAMINE-DEXTROAMPHETAMINE 20 MG PO TABS: 20.0000 mg | ORAL_TABLET | Freq: Two times a day (BID) | ORAL | Status: DC

## 2014-07-14 NOTE — Patient Instructions (Addendum)
Curcumen/turmeric caps daily for pain  Belviq or QysmiaDASH Eating Plan DASH stands for "Dietary Approaches to Stop Hypertension." The DASH eating plan is a healthy eating plan that has been shown to reduce high blood pressure (hypertension). Additional health benefits may include reducing the risk of type 2 diabetes mellitus, heart disease, and stroke. The DASH eating plan may also help with weight loss. WHAT DO I NEED TO KNOW ABOUT THE DASH EATING PLAN? For the DASH eating plan, you will follow these general guidelines:  Choose foods with a percent daily value for sodium of less than 5% (as listed on the food label).  Use salt-free seasonings or herbs instead of table salt or sea salt.  Check with your health care provider or pharmacist before using salt substitutes.  Eat lower-sodium products, often labeled as "lower sodium" or "no salt added."  Eat fresh foods.  Eat more vegetables, fruits, and low-fat dairy products.  Choose whole grains. Look for the word "whole" as the first word in the ingredient list.  Choose fish and skinless chicken or [place] more often than red meat. Limit fish, poultry, and meat to 6 oz (170 g) each day.  Limit sweets, desserts, sugars, and sugary drinks.  Choose heart-healthy fats.  Limit cheese to 1 oz (28 g) per day.  Eat more home-cooked food and less restaurant, buffet, and fast food.  Limit fried foods.  Cook foods using methods other than frying.  Limit canned vegetables. If you do use them, rinse them well to decrease the sodium.  When eating at a restaurant, ask that your food be prepared with less salt, or no salt if possible. WHAT FOODS CAN I EAT? Seek help from a dietitian for individual calorie needs. Grains Whole grain or whole wheat bread. Brown rice. Whole grain or whole wheat pasta. Quinoa, bulgur, and whole grain cereals. Low-sodium cereals. Corn or whole wheat flour tortillas. Whole grain cornbread. Whole grain crackers.  Low-sodium crackers. Vegetables Fresh or frozen vegetables (raw, steamed, roasted, or grilled). Low-sodium or reduced-sodium tomato and vegetable juices. Low-sodium or reduced-sodium tomato sauce and paste. Low-sodium or reduced-sodium canned vegetables.  Fruits All fresh, canned (in natural juice), or frozen fruits. Meat and Other Protein Products Ground beef (85% or leaner), grass-fed beef, or beef trimmed of fat. Skinless chicken or [place]. Ground chicken or [place]. Pork trimmed of fat. All fish and seafood. Eggs. Dried beans, peas, or lentils. Unsalted nuts and seeds. Unsalted canned beans. Dairy Low-fat dairy products, such as skim or 1% milk, 2% or reduced-fat cheeses, low-fat ricotta or cottage cheese, or plain low-fat yogurt. Low-sodium or reduced-sodium cheeses. Fats and Oils Tub margarines without trans fats. Light or reduced-fat mayonnaise and salad dressings (reduced sodium). Avocado. Safflower, olive, or canola oils. Natural peanut or almond butter. Other Unsalted popcorn and pretzels. The items listed above may not be a complete list of recommended foods or beverages. Contact your dietitian for more options. WHAT FOODS ARE NOT RECOMMENDED? Grains White bread. White pasta. White rice. Refined cornbread. Bagels and croissants. Crackers that contain trans fat. Vegetables Creamed or fried vegetables. Vegetables in a cheese sauce. Regular canned vegetables. Regular canned tomato sauce and paste. Regular tomato and vegetable juices. Fruits Dried fruits. Canned fruit in light or heavy syrup. Fruit juice. Meat and Other Protein Products Fatty cuts of meat. Ribs, chicken wings, bacon, sausage, bologna, salami, chitterlings, fatback, hot dogs, bratwurst, and packaged luncheon meats. Salted nuts and seeds. Canned beans with salt. Dairy Whole or 2% milk, cream, half-and-half,  and cream cheese. Whole-fat or sweetened yogurt. Full-fat cheeses or blue cheese. Nondairy creamers and whipped  toppings. Processed cheese, cheese spreads, or cheese curds. Condiments Onion and garlic salt, seasoned salt, table salt, and sea salt. Canned and packaged gravies. Worcestershire sauce. Tartar sauce. Barbecue sauce. Teriyaki sauce. Soy sauce, including reduced sodium. Steak sauce. Fish sauce. Oyster sauce. Cocktail sauce. Horseradish. Ketchup and mustard. Meat flavorings and tenderizers. Bouillon cubes. Hot sauce. Tabasco sauce. Marinades. Taco seasonings. Relishes. Fats and Oils Butter, stick margarine, lard, shortening, ghee, and bacon fat. Coconut, palm kernel, or palm oils. Regular salad dressings. Other Pickles and olives. Salted popcorn and pretzels. The items listed above may not be a complete list of foods and beverages to avoid. Contact your dietitian for more information. WHERE CAN I FIND MORE INFORMATION? National Heart, Lung, and Blood Institute: travelstabloid.com Document Released: 02/17/2011 Document Revised: 07/15/2013 Document Reviewed: 01/02/2013 [facility] Patient Information 2015 [place], [place]. This information is not intended to replace advice given to you by your health care provider. Make sure you discuss any questions you have with your health care provider.
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
## 2014-07-14 NOTE — Assessment & Plan Note (Signed)
Pes planus, r>l foot pain, referred to sports med and d/c meloxicam, did not work, restart Ibuprofen for now consider Curcumen

## 2014-07-14 NOTE — Assessment & Plan Note (Signed)
Encouraged heart healthy diet, increase exercise, avoid trans fats, consider a krill oil cap daily 

## 2014-07-14 NOTE — Assessment & Plan Note (Signed)
Avoid offending foods, start probiotics. Do not eat large meals in late evening and consider raising head of bed.  

## 2014-07-14 NOTE — Assessment & Plan Note (Signed)
Given refills on Adderall

## 2014-07-14 NOTE — Assessment & Plan Note (Signed)
Encouraged DASH diet, decrease po intake and increase exercise as tolerated. Needs 7-8 hours of sleep nightly. Avoid trans fats, eat small, frequent meals every 4-5 hours with lean proteins, complex carbs and healthy fats. Minimize simple carbs, GMO foods. Referred to nutritionist. Consider Belviq

## 2014-07-14 NOTE — Assessment & Plan Note (Signed)
Will repeat echo to reevaluate. Patient asymptomatic

## 2014-07-14 NOTE — Progress Notes (Signed)
Pre visit review using our clinic review tool, if applicable. No additional management support is needed unless otherwise documented below in the visit note. 

## 2014-07-15 ENCOUNTER — Encounter: Payer: Self-pay | Admitting: Family Medicine

## 2014-07-20 NOTE — Assessment & Plan Note (Signed)
No recent episodes

## 2014-07-20 NOTE — Assessment & Plan Note (Signed)
Meloxicam not helpful. May try Advil prn. Encouraged moist heat and gentle stretching as tolerated. May try NSAIDs and prescription meds as directed and report if symptoms worsen or seek immediate care

## 2014-07-23 ENCOUNTER — Ambulatory Visit (HOSPITAL_BASED_OUTPATIENT_CLINIC_OR_DEPARTMENT_OTHER): Payer: PRIVATE HEALTH INSURANCE

## 2014-07-31 ENCOUNTER — Ambulatory Visit: Payer: PRIVATE HEALTH INSURANCE | Admitting: Family Medicine

## 2014-09-02 ENCOUNTER — Ambulatory Visit (INDEPENDENT_AMBULATORY_CARE_PROVIDER_SITE_OTHER): Payer: PRIVATE HEALTH INSURANCE | Admitting: Family Medicine

## 2014-09-02 ENCOUNTER — Encounter: Payer: Self-pay | Admitting: Family Medicine

## 2014-09-02 ENCOUNTER — Other Ambulatory Visit (INDEPENDENT_AMBULATORY_CARE_PROVIDER_SITE_OTHER): Payer: PRIVATE HEALTH INSURANCE

## 2014-09-02 VITALS — BP 120/84 | HR 91 | Ht 64.0 in | Wt 206.0 lb

## 2014-09-02 DIAGNOSIS — M7751 Other enthesopathy of right foot: Secondary | ICD-10-CM

## 2014-09-02 DIAGNOSIS — M71571 Other bursitis, not elsewhere classified, right ankle and foot: Secondary | ICD-10-CM

## 2014-09-02 DIAGNOSIS — M79671 Pain in right foot: Secondary | ICD-10-CM
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
## 2014-09-02 NOTE — Progress Notes (Signed)
Pre visit review using our clinic review tool, if applicable. No additional management support is needed unless otherwise documented below in the visit note. 

## 2014-09-02 NOTE — Assessment & Plan Note (Signed)
Given HEP, no sign of nueroma, breakdown of longitudinal arch.  Discussed proper shoes as well as home exercises. Patient work with Product/process development scientist today. Discuss over-the-counter natural supplements as well as a over-the-counter orthotic. Patient and will come back and see me again in 3 weeks.

## 2014-09-02 NOTE — Patient Instructions (Signed)
Good to see you.  Ice 20 minutes 2 times daily. Usually after activity and before bed. Exercises 3 times a week.  pennsaid pinkie amount topically 2 times daily as needed.  Turmeric 500mg  twice daily Good shoes with rigid bottom.  [name], [place] or New balance greater then 700 Elliptical biking would be fine right now See me again in 3 weeks or call me if you change your mind on the prednisone Intermetatarsal bursitis.

## 2014-09-10 ENCOUNTER — Ambulatory Visit (HOSPITAL_BASED_OUTPATIENT_CLINIC_OR_DEPARTMENT_OTHER)
Admission: RE | Admit: 2014-09-10 | Discharge: 2014-09-10 | Disposition: A | Discharge status: Home / Self Care | Payer: PRIVATE HEALTH INSURANCE | Source: Ambulatory Visit | Attending: Family Medicine | Admitting: Family Medicine

## 2014-09-10 DIAGNOSIS — Q244 Congenital subaortic stenosis: Secondary | ICD-10-CM | POA: Diagnosis not present

## 2014-09-10 DIAGNOSIS — I38 Endocarditis, valve unspecified: Secondary | ICD-10-CM

## 2014-09-10 NOTE — Progress Notes (Signed)
Echocardiogram 2D Echocardiogram has been performed.  Tresa Res 09/10/2014, 4:40 PM

## 2014-09-11 ENCOUNTER — Encounter: Payer: Self-pay | Admitting: Family Medicine

## 2014-09-11 ENCOUNTER — Other Ambulatory Visit: Payer: Self-pay | Admitting: Family Medicine

## 2014-09-11 DIAGNOSIS — I340 Nonrheumatic mitral (valve) insufficiency: Secondary | ICD-10-CM

## 2014-09-17 ENCOUNTER — Encounter: Payer: Self-pay | Admitting: Family Medicine

## 2014-09-24 ENCOUNTER — Ambulatory Visit: Payer: PRIVATE HEALTH INSURANCE | Admitting: Family Medicine

## 2014-10-22 ENCOUNTER — Ambulatory Visit (INDEPENDENT_AMBULATORY_CARE_PROVIDER_SITE_OTHER): Payer: PRIVATE HEALTH INSURANCE | Admitting: Physician Assistant

## 2014-10-22 ENCOUNTER — Encounter: Payer: Self-pay | Admitting: *Deleted

## 2014-10-22 ENCOUNTER — Other Ambulatory Visit: Payer: Self-pay | Admitting: Physician Assistant

## 2014-10-22 ENCOUNTER — Encounter: Payer: Self-pay | Admitting: Physician Assistant

## 2014-10-22 VITALS — BP 130/70 | HR 94 | Ht 64.0 in | Wt 209.0 lb

## 2014-10-22 DIAGNOSIS — Q244 Congenital subaortic stenosis: Secondary | ICD-10-CM
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
## 2014-10-22 NOTE — Patient Instructions (Addendum)
Medication Instructions:  Your physician has recommended you make the following change in your medication:  1.  OK to restart your Adderall   Labwork: None ordered  Testing/Procedures: Your physician has requested that you have a TEE on 10-24-14 @ 9:00.  During a TEE, sound waves are used to create images of your heart. It provides your doctor with information about the size and shape of your heart and how well your heart's chambers and valves are working. In this test, a transducer is attached to the end of a flexible tube that's guided down your throat and into your esophagus (the tube leading from you mouth to your stomach) to get a more detailed image of your heart. You are not awake for the procedure. Please see the instruction sheet given to you today. For further information please visit [email or web address].     Follow-Up: Your physician wants you to follow-up in: 6 months with Dr. Radford [name].  You will receive a reminder letter in the mail two months in advance. If you don't receive a letter, please call our office to schedule the follow-up appointment.    Any Other Special Instructions Will Be Listed Below (If Applicable).  Transesophageal Echocardiogram Transesophageal echocardiography (TEE) is a special type of test that produces images of the heart by using sound waves (echocardiogram). This type of echocardiography can obtain better images of the heart than standard echocardiography. TEE is done by passing a flexible tube down the esophagus. The heart is located in front of the esophagus. Because the heart and esophagus are close to one another, your health care provider can take very clear, detailed pictures of the heart via ultrasound waves. TEE may be done:  If your health care provider needs more information based on standard echocardiography findings.  If you had a stroke. This might have happened because a clot formed in your heart. TEE can visualize different areas of the  heart and check for clots.  To check valve anatomy and function.  To check for infection on the inside of your heart (endocarditis).  To evaluate the dividing wall (septum) of the heart and presence of a hole that did not close after birth (patent foramen ovale or atrial septal defect).  To help diagnose a tear in the wall of the aorta (aortic dissection).  During cardiac valve surgery. This allows the surgeon to assess the valve repair before closing the chest.  During a variety of other cardiac procedures to guide positioning of catheters.  Sometimes before a cardioversion, which is a shock to convert heart rhythm back to normal. LET [facility] CARE PROVIDER KNOW ABOUT:   Any allergies you have.  All medicines you are taking, including vitamins, herbs, eye drops, creams, and over-the-counter medicines.  Previous problems you or members of your family have had with the use of anesthetics.  Any blood disorders you have.  Previous surgeries you have had.  Medical conditions you have.  Swallowing difficulties.  An esophageal obstruction. RISKS AND COMPLICATIONS  Generally, TEE is a safe procedure. However, as with any procedure, complications can occur. Possible complications include an esophageal tear (rupture). BEFORE THE PROCEDURE   Do not eat or drink for 6 hours before the procedure or as directed by your health care provider.  Arrange for someone to drive you home after the procedure. Do not drive yourself home. During the procedure, you will be given medicines that can continue to make you feel drowsy and can impair your reflexes.  An IV access  tube will be started in the arm. PROCEDURE   A medicine to help you relax (sedative) will be given through the IV access tube.  A medicine may be sprayed or gargled to numb the back of the throat.  Your blood pressure, heart rate, and breathing (vital signs) will be monitored during the procedure.  The TEE probe is a  long, flexible tube. The tip of the probe is placed into the back of the mouth, and you will be asked to swallow. This helps to pass the tip of the probe into the esophagus. Once the tip of the probe is in the correct area, your health care provider can take pictures of the heart.  TEE is usually not a painful procedure. You may feel the probe press against the back of the throat. The probe does not enter the trachea and does not affect your breathing. AFTER THE PROCEDURE   You will be in bed, resting, until you have fully returned to consciousness.  When you first awaken, your throat may feel slightly sore and will probably still feel numb. This will improve slowly over time.  You will not be allowed to eat or drink until it is clear that the numbness has improved.  Once you have been able to drink, urinate, and sit on the edge of the bed without feeling sick to your stomach (nausea) or dizzy, you may be cleared to go home.  You should have a friend or family member with you for the next 24 hours after your procedure. Document Released: 05/21/2002 Document Revised: 03/05/2013 Document Reviewed: 08/30/2012 [facility] Patient Information 2015 [place], [place]. This information is not intended to replace advice given to you by your health care provider. Make sure you discuss any questions you have with your health care provider.

## 2014-10-22 NOTE — Assessment & Plan Note (Addendum)
Patient has history of subaortic stenosis and underwent surgery 15 years ago at [name]. She now has moderate to severe aortic insufficiency on 2-D echo. She has had some dyspnea on exertion. Discussed in detail with Dr. Radford Pax. She recommends the patient have a TEE. Risk and benefits explained to the patient. After this she will be referred to cardiovascular surgeon. Follow-up with Dr. Radford Pax 2 weeks after TEE. Patient may take Adderall as long she is not tachycardic.

## 2014-10-23 ENCOUNTER — Encounter: Payer: Self-pay | Admitting: Family Medicine

## 2014-10-23 ENCOUNTER — Ambulatory Visit (INDEPENDENT_AMBULATORY_CARE_PROVIDER_SITE_OTHER): Payer: PRIVATE HEALTH INSURANCE | Admitting: Family Medicine

## 2014-10-23 VITALS — BP 110/82 | HR 84 | Temp 98.9°F | Ht 64.0 in | Wt 208.4 lb

## 2014-10-23 DIAGNOSIS — R002 Palpitations: Secondary | ICD-10-CM | POA: Diagnosis not present

## 2014-10-23 DIAGNOSIS — F909 Attention-deficit hyperactivity disorder, unspecified type: Secondary | ICD-10-CM

## 2014-10-23 DIAGNOSIS — I351 Nonrheumatic aortic (valve) insufficiency: Secondary | ICD-10-CM

## 2014-10-23 DIAGNOSIS — E785 Hyperlipidemia, unspecified: Secondary | ICD-10-CM | POA: Diagnosis not present

## 2014-10-23 DIAGNOSIS — F988 Other specified behavioral and emotional disorders with onset usually occurring in childhood and adolescence: Secondary | ICD-10-CM

## 2014-10-23 LAB — LIPID PANEL
CHOLESTEROL: 236 mg/dL — AB (ref 0–200)
HDL: 43.5 mg/dL (ref >39.00)
LDL Cholesterol: 167 mg/dL — ABNORMAL HIGH (ref 0–99)
NonHDL: 192.45
TRIGLYCERIDES: 128 mg/dL (ref 0.0–149.0)
Total CHOL/HDL Ratio: 5
VLDL: 25.6 mg/dL (ref 0.0–40.0)

## 2014-10-23 LAB — CBC
HEMATOCRIT: 38.6 % (ref 36.0–46.0)
HEMOGLOBIN: 13.2 g/dL (ref 12.0–15.0)
MCHC: 34.1 g/dL (ref 30.0–36.0)
MCV: 95.6 fl (ref 78.0–100.0)
Platelets: 279 10*3/uL (ref 150.0–400.0)
RBC: 4.04 Mil/uL (ref 3.87–5.11)
RDW: 12.9 % (ref 11.5–15.5)
WBC: 8 10*3/uL (ref 4.0–10.5)

## 2014-10-23 LAB — COMPREHENSIVE METABOLIC PANEL
ALBUMIN: 4.4 g/dL (ref 3.5–5.2)
ALK PHOS: 43 U/L (ref 39–117)
ALT: 34 U/L (ref 0–35)
AST: 28 U/L (ref 0–37)
BUN: 17 mg/dL (ref 6–23)
CO2: 26 mEq/L (ref 19–32)
Calcium: 9.5 mg/dL (ref 8.4–10.5)
Chloride: 102 mEq/L (ref 96–112)
Creatinine, Ser: 0.81 mg/dL (ref 0.40–1.20)
GFR: 81.23 mL/min (ref >60.00)
Glucose, Bld: 120 mg/dL — ABNORMAL HIGH (ref 70–99)
Potassium: 4.2 mEq/L (ref 3.5–5.1)
Sodium: 136 mEq/L (ref 135–145)
Total Bilirubin: 0.4 mg/dL (ref 0.2–1.2)
Total Protein: 7.6 g/dL (ref 6.0–8.3)

## 2014-10-23 LAB — T4, FREE: Free T4: 0.68 ng/dL (ref 0.60–1.60)

## 2014-10-23 LAB — TSH: TSH: 3.04 u[IU]/mL (ref 0.35–4.50)

## 2014-10-23 MED ORDER — AMPHETAMINE-DEXTROAMPHETAMINE 20 MG PO TABS: 20.0000 mg | ORAL_TABLET | Freq: Two times a day (BID) | ORAL | Status: DC

## 2014-10-23 MED ORDER — AMPHETAMINE-DEXTROAMPHETAMINE 20 MG PO TABS: 20.0000 mg | ORAL_TABLET | Freq: Every day | ORAL | Status: DC

## 2014-10-23 MED ORDER — BUPROPION HCL ER (XL) 300 MG PO TB24: 300.0000 mg | ORAL_TABLET | Freq: Every day | ORAL | Status: DC

## 2014-10-23 NOTE — Assessment & Plan Note (Addendum)
has given the green light to restart the Adderall. Will give prescription for 3 months

## 2014-10-23 NOTE — Patient Instructions (Addendum)
benefiber or Metamucil/Psyllium powder then if still constipated can consider daily Miralax or Docusate stool softener   DASH Eating Plan DASH stands for "Dietary Approaches to Stop Hypertension." The DASH eating plan is a healthy eating plan that has been shown to reduce high blood pressure (hypertension). Additional health benefits may include reducing the risk of type 2 diabetes mellitus, heart disease, and stroke. The DASH eating plan may also help with weight loss. WHAT DO I NEED TO KNOW ABOUT THE DASH EATING PLAN? For the DASH eating plan, you will follow these general guidelines:  Choose foods with a percent daily value for sodium of less than 5% (as listed on the food label).  Use salt-free seasonings or herbs instead of table salt or sea salt.  Check with your health care provider or pharmacist before using salt substitutes.  Eat lower-sodium products, often labeled as "lower sodium" or "no salt added."  Eat fresh foods.  Eat more vegetables, fruits, and low-fat dairy products.  Choose whole grains. Look for the word "whole" as the first word in the ingredient list.  Choose fish and skinless chicken or [place] more often than red meat. Limit fish, poultry, and meat to 6 oz (170 g) each day.  Limit sweets, desserts, sugars, and sugary drinks.  Choose heart-healthy fats.  Limit cheese to 1 oz (28 g) per day.  Eat more home-cooked food and less restaurant, buffet, and fast food.  Limit fried foods.  Cook foods using methods other than frying.  Limit canned vegetables. If you do use them, rinse them well to decrease the sodium.  When eating at a restaurant, ask that your food be prepared with less salt, or no salt if possible. WHAT FOODS CAN I EAT? Seek help from a dietitian for individual calorie needs. Grains Whole grain or whole wheat bread. Brown rice. Whole grain or whole wheat pasta. Quinoa, bulgur, and whole grain cereals. Low-sodium cereals. Corn or whole wheat  flour tortillas. Whole grain cornbread. Whole grain crackers. Low-sodium crackers. Vegetables Fresh or frozen vegetables (raw, steamed, roasted, or grilled). Low-sodium or reduced-sodium tomato and vegetable juices. Low-sodium or reduced-sodium tomato sauce and paste. Low-sodium or reduced-sodium canned vegetables.  Fruits All fresh, canned (in natural juice), or frozen fruits. Meat and Other Protein Products Ground beef (85% or leaner), grass-fed beef, or beef trimmed of fat. Skinless chicken or [place]. Ground chicken or [place]. Pork trimmed of fat. All fish and seafood. Eggs. Dried beans, peas, or lentils. Unsalted nuts and seeds. Unsalted canned beans. Dairy Low-fat dairy products, such as skim or 1% milk, 2% or reduced-fat cheeses, low-fat ricotta or cottage cheese, or plain low-fat yogurt. Low-sodium or reduced-sodium cheeses. Fats and Oils Tub margarines without trans fats. Light or reduced-fat mayonnaise and salad dressings (reduced sodium). Avocado. Safflower, olive, or canola oils. Natural peanut or almond butter. Other Unsalted popcorn and pretzels. The items listed above may not be a complete list of recommended foods or beverages. Contact your dietitian for more options. WHAT FOODS ARE NOT RECOMMENDED? Grains White bread. White pasta. White rice. Refined cornbread. Bagels and croissants. Crackers that contain trans fat. Vegetables Creamed or fried vegetables. Vegetables in a cheese sauce. Regular canned vegetables. Regular canned tomato sauce and paste. Regular tomato and vegetable juices. Fruits Dried fruits. Canned fruit in light or heavy syrup. Fruit juice. Meat and Other Protein Products Fatty cuts of meat. Ribs, chicken wings, bacon, sausage, bologna, salami, chitterlings, fatback, hot dogs, bratwurst, and packaged luncheon meats. Salted nuts and seeds. Canned  beans with salt. Dairy Whole or 2% milk, cream, half-and-half, and cream cheese. Whole-fat or sweetened yogurt.  Full-fat cheeses or blue cheese. Nondairy creamers and whipped toppings. Processed cheese, cheese spreads, or cheese curds. Condiments Onion and garlic salt, seasoned salt, table salt, and sea salt. Canned and packaged gravies. Worcestershire sauce. Tartar sauce. Barbecue sauce. Teriyaki sauce. Soy sauce, including reduced sodium. Steak sauce. Fish sauce. Oyster sauce. Cocktail sauce. Horseradish. Ketchup and mustard. Meat flavorings and tenderizers. Bouillon cubes. Hot sauce. Tabasco sauce. Marinades. Taco seasonings. Relishes. Fats and Oils Butter, stick margarine, lard, shortening, ghee, and bacon fat. Coconut, palm kernel, or palm oils. Regular salad dressings. Other Pickles and olives. Salted popcorn and pretzels. The items listed above may not be a complete list of foods and beverages to avoid. Contact your dietitian for more information. WHERE CAN I FIND MORE INFORMATION? National Heart, Lung, and Blood Institute: travelstabloid.com Document Released: 02/17/2011 Document Revised: 07/15/2013 Document Reviewed: 01/02/2013 [facility] Patient Information 2015 [place], [place]. This information is not intended to replace advice given to you by your health care provider. Make sure you discuss any questions you have with your health care provider.

## 2014-10-23 NOTE — Assessment & Plan Note (Signed)
Avoid offending foods, start probiotics. Do not eat large meals in late evening and consider raising head of bed.  

## 2014-10-23 NOTE — Assessment & Plan Note (Signed)
Encouraged heart healthy diet, increase exercise, avoid trans fats, consider a krill oil cap daily. Recheck lipids
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
## 2014-10-23 NOTE — Progress Notes (Signed)
Pre visit review using our clinic review tool, if applicable. No additional management support is needed unless otherwise documented below in the visit note. 

## 2014-10-23 NOTE — Assessment & Plan Note (Signed)
Encouraged DASH diet, decrease po intake and increase exercise as tolerated. Needs 7-8 hours of sleep nightly. Avoid trans fats, eat small, frequent meals every 4-5 hours with lean proteins, complex carbs and healthy fats. Minimize simple carbs, GMO foods. 

## 2014-10-24 ENCOUNTER — Encounter (HOSPITAL_COMMUNITY)
Admission: RE | Disposition: A | Discharge status: Home / Self Care | Payer: Self-pay | Source: Ambulatory Visit | Attending: Cardiology

## 2014-10-24 ENCOUNTER — Ambulatory Visit (HOSPITAL_COMMUNITY)
Admission: RE | Admit: 2014-10-24 | Discharge: 2014-10-24 | Disposition: A | Discharge status: Home / Self Care | Payer: PRIVATE HEALTH INSURANCE | Source: Ambulatory Visit | Attending: Cardiology | Admitting: Cardiology

## 2014-10-24 ENCOUNTER — Other Ambulatory Visit (INDEPENDENT_AMBULATORY_CARE_PROVIDER_SITE_OTHER): Payer: PRIVATE HEALTH INSURANCE

## 2014-10-24 ENCOUNTER — Ambulatory Visit (HOSPITAL_COMMUNITY): Payer: PRIVATE HEALTH INSURANCE | Admitting: Certified Registered Nurse Anesthetist

## 2014-10-24 ENCOUNTER — Telehealth: Payer: Self-pay | Admitting: Cardiology

## 2014-10-24 ENCOUNTER — Ambulatory Visit (HOSPITAL_COMMUNITY): Payer: PRIVATE HEALTH INSURANCE

## 2014-10-24 ENCOUNTER — Other Ambulatory Visit: Payer: Self-pay | Admitting: Family Medicine

## 2014-10-24 ENCOUNTER — Encounter (HOSPITAL_COMMUNITY): Payer: Self-pay | Admitting: *Deleted

## 2014-10-24 DIAGNOSIS — I351 Nonrheumatic aortic (valve) insufficiency: Secondary | ICD-10-CM | POA: Diagnosis not present

## 2014-10-24 DIAGNOSIS — R739 Hyperglycemia, unspecified: Secondary | ICD-10-CM | POA: Diagnosis not present

## 2014-10-24 DIAGNOSIS — E785 Hyperlipidemia, unspecified: Secondary | ICD-10-CM | POA: Diagnosis not present

## 2014-10-24 DIAGNOSIS — Z853 Personal history of malignant neoplasm of breast: Secondary | ICD-10-CM | POA: Insufficient documentation

## 2014-10-24 HISTORY — DX: Nausea with vomiting, unspecified: R11.2

## 2014-10-24 HISTORY — DX: Other specified postprocedural states: Z98.890

## 2014-10-24 HISTORY — PX: TEE WITHOUT CARDIOVERSION: SHX5443

## 2014-10-24 HISTORY — DX: Other complications of anesthesia, initial encounter: T88.59XA

## 2014-10-24 HISTORY — DX: Adverse effect of unspecified anesthetic, initial encounter: T41.45XA

## 2014-10-24 LAB — HEMOGLOBIN A1C: Hgb A1c MFr Bld: 5.4 % (ref 4.6–6.5)

## 2014-10-24 SURGERY — ECHOCARDIOGRAM, TRANSESOPHAGEAL
Anesthesia: Monitor Anesthesia Care

## 2014-10-24 MED ORDER — ATORVASTATIN CALCIUM 10 MG PO TABS: 10.0000 mg | ORAL_TABLET | Freq: Every day | ORAL | Status: DC

## 2014-10-24 MED ORDER — BUTAMBEN-TETRACAINE-BENZOCAINE 2-2-14 % EX AERO
INHALATION_SPRAY | CUTANEOUS | Status: DC | PRN
  Administered 2014-10-24: 2 via TOPICAL

## 2014-10-24 MED ORDER — FENTANYL CITRATE (PF) 100 MCG/2ML IJ SOLN
INTRAMUSCULAR | Status: DC | PRN
  Administered 2014-10-24: 12.5 ug via INTRAVENOUS
  Administered 2014-10-24: 25 ug via INTRAVENOUS
  Administered 2014-10-24 (×2): 12.5 ug via INTRAVENOUS

## 2014-10-24 MED ORDER — MIDAZOLAM HCL 10 MG/2ML IJ SOLN
INTRAMUSCULAR | Status: DC | PRN
  Administered 2014-10-24 (×4): 2 mg via INTRAVENOUS

## 2014-10-24 MED ORDER — PROPOFOL 10 MG/ML IV BOLUS
INTRAVENOUS | Status: DC | PRN
  Administered 2014-10-24 (×2): 20 mg via INTRAVENOUS
  Administered 2014-10-24: 10 mg via INTRAVENOUS
  Administered 2014-10-24: 50 mg via INTRAVENOUS
  Administered 2014-10-24: 20 mg via INTRAVENOUS

## 2014-10-24 MED ORDER — MIDAZOLAM HCL 5 MG/ML IJ SOLN
INTRAMUSCULAR | Status: AC
  Filled 2014-10-24: qty 2

## 2014-10-24 MED ORDER — LIDOCAINE HCL (CARDIAC) 20 MG/ML IV SOLN
INTRAVENOUS | Status: DC | PRN
  Administered 2014-10-24: 60 mg via INTRAVENOUS

## 2014-10-24 MED ORDER — SODIUM CHLORIDE 0.9 % IV SOLN
INTRAVENOUS | Status: DC
  Administered 2014-10-24: 08:00:00 via INTRAVENOUS

## 2014-10-24 MED ORDER — FENTANYL CITRATE (PF) 100 MCG/2ML IJ SOLN
INTRAMUSCULAR | Status: AC
  Filled 2014-10-24: qty 4

## 2014-10-24 MED ORDER — SODIUM CHLORIDE 0.9 % IV SOLN
INTRAVENOUS | Status: DC | PRN
  Administered 2014-10-24: 11:00:00 via INTRAVENOUS

## 2014-10-24 MED ORDER — DIPHENHYDRAMINE HCL 50 MG/ML IJ SOLN
INTRAMUSCULAR | Status: AC
  Filled 2014-10-24: qty 1

## 2014-10-24 NOTE — Progress Notes (Signed)
  Echocardiogram Echocardiogram Transesophageal has been performed.  [name] 10/24/2014, 11:27 AM

## 2014-10-24 NOTE — Discharge Instructions (Signed)
Transesophageal Echocardiogram °Transesophageal echocardiography (TEE) is a special type of test that produces images of the heart by using sound waves (echocardiogram). This type of echocardiography can obtain better images of the heart than standard echocardiography. TEE is done by passing a flexible tube down the esophagus. The heart is located in front of the esophagus. Because the heart and esophagus are close to one another, your health care provider can take very clear, detailed pictures of the heart via ultrasound waves. °TEE may be done: °· If your health care provider needs more information based on standard echocardiography findings. °· If you had a stroke. This might have happened because a clot formed in your heart. TEE can visualize different areas of the heart and check for clots. °· To check valve anatomy and function. °· To check for infection on the inside of your heart (endocarditis). °· To evaluate the dividing wall (septum) of the heart and presence of a hole that did not close after birth (patent foramen ovale or atrial septal defect). °· To help diagnose a tear in the wall of the aorta (aortic dissection). °· During cardiac valve surgery. This allows the surgeon to assess the valve repair before closing the chest. °· During a variety of other cardiac procedures to guide positioning of catheters. °· Sometimes before a cardioversion, which is a shock to convert heart rhythm back to normal. °LET YOUR HEALTH CARE PROVIDER KNOW ABOUT:  °· Any allergies you have. °· All medicines you are taking, including vitamins, herbs, eye drops, creams, and over-the-counter medicines. °· Previous problems you or members of your family have had with the use of anesthetics. °· Any blood disorders you have. °· Previous surgeries you have had. °· Medical conditions you have. °· Swallowing difficulties. °· An esophageal obstruction. °RISKS AND COMPLICATIONS  °Generally, TEE is a safe procedure. However, as with any  procedure, complications can occur. Possible complications include an esophageal tear (rupture). °BEFORE THE PROCEDURE  °· Do not eat or drink for 6 hours before the procedure or as directed by your health care provider. °· Arrange for someone to drive you home after the procedure. Do not drive yourself home. During the procedure, you will be given medicines that can continue to make you feel drowsy and can impair your reflexes. °· An IV access tube will be started in the arm. °PROCEDURE  °· A medicine to help you relax (sedative) will be given through the IV access tube. °· A medicine may be sprayed or gargled to numb the back of the throat. °· Your blood pressure, heart rate, and breathing (vital signs) will be monitored during the procedure. °· The TEE probe is a long, flexible tube. The tip of the probe is placed into the back of the mouth, and you will be asked to swallow. This helps to pass the tip of the probe into the esophagus. Once the tip of the probe is in the correct area, your health care provider can take pictures of the heart. °· TEE is usually not a painful procedure. You may feel the probe press against the back of the throat. The probe does not enter the trachea and does not affect your breathing. °AFTER THE PROCEDURE  °· You will be in bed, resting, until you have fully returned to consciousness. °· When you first awaken, your throat may feel slightly sore and will probably still feel numb. This will improve slowly over time. °· You will not be allowed to eat or drink until it   is clear that the numbness has improved. °· Once you have been able to drink, urinate, and sit on the edge of the bed without feeling sick to your stomach (nausea) or dizzy, you may be cleared to go home. °· You should have a friend or family member with you for the next 24 hours after your procedure. °Document Released: 05/21/2002 Document Revised: 03/05/2013 Document Reviewed: 08/30/2012 °Exit[employer]® Patient Information  ©2015 [employer]. This information is not intended to replace advice given to you by your health care provider. Make sure you discuss any questions you have with your health care provider. ° °
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
## 2014-10-24 NOTE — Interval H&P Note (Signed)
History and Physical Interval Note:  10/24/2014 10:03 AM  [name]  has presented today for surgery, with the diagnosis of AORTIC INSUFFICIENCY   The various methods of treatment have been discussed with the patient and family. After consideration of risks, benefits and other options for treatment, the patient has consented to  Procedure(s): TRANSESOPHAGEAL ECHOCARDIOGRAM (TEE) (N/A) as a surgical intervention .  The patient's history has been reviewed, patient examined, no change in status, stable for surgery.  I have reviewed the patient's chart and labs.  Questions were answered to the patient's satisfaction.     [name],[name]

## 2014-10-24 NOTE — Transfer of Care (Signed)
Immediate Anesthesia Transfer of Care Note  Patient: [name]  Procedure(s) Performed: Procedure(s): TRANSESOPHAGEAL ECHOCARDIOGRAM (TEE) (N/A)  Patient Location: Endoscopy Unit  Anesthesia Type:MAC  Level of Consciousness: awake, alert  and oriented  Airway & Oxygen Therapy: Patient Spontanous Breathing and Patient connected to nasal cannula oxygen  Post-op Assessment: Report given to RN and Post -op Vital signs reviewed and stable  Post vital signs: Reviewed and stable  Last Vitals:  Filed Vitals:   10/24/14 1035  BP: 163/80  Pulse: 82  Temp:   Resp: 21    Complications: No apparent anesthesia complications

## 2014-10-24 NOTE — Anesthesia Preprocedure Evaluation (Addendum)
Anesthesia Evaluation  Patient identified by MRN, date of birth, ID band Patient awake    Reviewed: Allergy & Precautions, NPO status , Patient's Chart, lab work & pertinent test results, Unable to perform ROS - Chart review only  History of Anesthesia Complications (+) PONV  Airway Mallampati: II       Dental  (+) Teeth Intact   Pulmonary neg pulmonary ROS,  + rhonchi         Cardiovascular negative cardio ROS  Rhythm:Regular Rate:Normal  Possible diastolic murmur but unable to assess thoroughly due to rapid assessment   Neuro/Psych negative neurological ROS  negative psych ROS   GI/Hepatic Neg liver ROS, GERD-  Medicated,  Endo/Other  negative endocrine ROS  Renal/GU negative Renal ROS  negative genitourinary   Musculoskeletal negative musculoskeletal ROS (+)   Abdominal   Peds negative pediatric ROS (+)  Hematology negative hematology ROS (+)   Anesthesia Other Findings   Reproductive/Obstetrics negative OB ROS                            Anesthesia Physical Anesthesia Plan  ASA: II  Anesthesia Plan: MAC   Post-op Pain Management:    Induction: Intravenous  Airway Management Planned: Natural Airway  Additional Equipment:   Intra-op Plan:   Post-operative Plan:   Informed Consent: I have reviewed the patients History and Physical, chart, labs and discussed the procedure including the risks, benefits and alternatives for the proposed anesthesia with the patient or authorized representative who has indicated his/her understanding and acceptance.   Dental advisory given  Plan Discussed with: CRNA  Anesthesia Plan Comments:         Anesthesia Quick Evaluation

## 2014-10-24 NOTE — Anesthesia Postprocedure Evaluation (Signed)
  Anesthesia Post-op Note  Patient: [name]  Procedure(s) Performed: Procedure(s): TRANSESOPHAGEAL ECHOCARDIOGRAM (TEE) (N/A)  Patient Location: Endoscopy Unit  Anesthesia Type:MAC  Level of Consciousness: awake and alert   Airway and Oxygen Therapy: Patient Spontanous Breathing  Post-op Pain: none  Post-op Assessment: Post-op Vital signs reviewed and Patient's Cardiovascular Status Stable              Post-op Vital Signs: Reviewed and stable  Last Vitals:  Filed Vitals:   10/24/14 1120  BP: 130/75  Pulse: 83  Temp:   Resp: 18    Complications: No apparent anesthesia complications

## 2014-10-24 NOTE — Progress Notes (Signed)
Unable to get pt fully sedated. Dr. Radford Pax attempted to place probe and pt woke up and fought, got anxious and was not able to tolerate. Probe out. CRNA to bedside with Dr. Smith Robert

## 2014-10-24 NOTE — CV Procedure (Signed)
   PROCEDURE NOTE   Procedure: Transesophageal echocardiogram Operator:  Fransico Him, MD Complication:  None Indications:  Aortic insufficiency IV Meds:  Versed 8mg , Fentanyl 62.5mg , Propofol 120mg   Results: Normal LV size and function. Normal RV size and function. Normal RA. Normal LA.  No appreciable LA appendage noted. Normal TV with mild TR Normal MV with trivial MR Normal PV with trivial PR Trileaflet AV with mildly thickened non coronary and left coronary cusps.  There is severe aortic insufficiency that fans out into the entire LVOT and travels along the LV septal wall. Normal thoracic and ascending aorta Normal interatrial septum with no evidence of shunt by colorflow doppler  The patient tolerated the procedure well and was transferred back to her room in stable condition.  Signed: Fransico Him, MD [facility] 10/24/2014

## 2014-10-24 NOTE — Telephone Encounter (Signed)
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  Please set up appt with Dr. Cyndia Bent for evaluation/

## 2014-10-27 ENCOUNTER — Encounter (HOSPITAL_COMMUNITY): Payer: Self-pay | Admitting: Cardiology

## 2014-10-27 ENCOUNTER — Telehealth: Payer: Self-pay | Admitting: Family Medicine

## 2014-10-27 NOTE — Telephone Encounter (Signed)
Caller name: Hassan Rowan with [facility] Can be reached: [phone]  Reason for call: Pt will need new rx for Adderall. System is saying DEA for Dr. Charlett Blake is expired. Please f/u as needed.

## 2014-10-27 NOTE — Telephone Encounter (Signed)
We had this trouble with DEA number last week. What do we need to do?

## 2014-10-28 ENCOUNTER — Telehealth: Payer: Self-pay | Admitting: Cardiology

## 2014-10-28 ENCOUNTER — Other Ambulatory Visit: Payer: Self-pay | Admitting: Family Medicine

## 2014-10-28 DIAGNOSIS — I351 Nonrheumatic aortic (valve) insufficiency: Secondary | ICD-10-CM

## 2014-10-28 NOTE — Telephone Encounter (Signed)
So check with [place] and find out what to do she was checking with the DEA because I renewed last month

## 2014-10-28 NOTE — Telephone Encounter (Signed)
PT  AWARE./CY 

## 2014-10-28 NOTE — Telephone Encounter (Signed)
REFERRAL ORDER ENTERED AND   NOTE  SENT TO [facility]  TO  MAKE APPT .[name]

## 2014-10-28 NOTE — Telephone Encounter (Signed)
Spoke to the pharmacist [facility] at [facility] and as of yesterday the hardcopy for the patients Adderall that they ran through the [facility] number had expired.

## 2014-10-28 NOTE — Telephone Encounter (Signed)
New message     Pt has not received call from surgeon regarding valve replacement surgery Pt stated she was instructed to call our office if she hasn't heard anything about the surgery Please call to discuss

## 2014-10-28 NOTE — Telephone Encounter (Signed)
Spoke with Ria [facility] at [facility] [address], the issue was with [employer]. She was able to speak with someone and get the issue resolved, the patients script has been filled and is ready for pick up, she has already spoken with the patient

## 2014-10-30 ENCOUNTER — Encounter: Payer: Self-pay | Admitting: Women's Health

## 2014-10-30 ENCOUNTER — Ambulatory Visit (INDEPENDENT_AMBULATORY_CARE_PROVIDER_SITE_OTHER): Payer: PRIVATE HEALTH INSURANCE | Admitting: Women's Health

## 2014-10-30 ENCOUNTER — Other Ambulatory Visit (HOSPITAL_COMMUNITY)
Admission: RE | Admit: 2014-10-30 | Discharge: 2014-10-30 | Disposition: A | Discharge status: Home / Self Care | Payer: PRIVATE HEALTH INSURANCE | Source: Ambulatory Visit | Attending: Women's Health | Admitting: Women's Health

## 2014-10-30 VITALS — BP 150/90 | Ht 64.0 in | Wt 207.0 lb

## 2014-10-30 DIAGNOSIS — Z1151 Encounter for screening for human papillomavirus (HPV): Secondary | ICD-10-CM | POA: Insufficient documentation

## 2014-10-30 DIAGNOSIS — Z01419 Encounter for gynecological examination (general) (routine) without abnormal findings: Secondary | ICD-10-CM | POA: Diagnosis not present

## 2014-10-30 DIAGNOSIS — A499 Bacterial infection, unspecified: Secondary | ICD-10-CM

## 2014-10-30 DIAGNOSIS — Z01411 Encounter for gynecological examination (general) (routine) with abnormal findings: Secondary | ICD-10-CM | POA: Insufficient documentation

## 2014-10-30 DIAGNOSIS — N760 Acute vaginitis: Secondary | ICD-10-CM

## 2014-10-30 DIAGNOSIS — B9689 Other specified bacterial agents as the cause of diseases classified elsewhere: Secondary | ICD-10-CM

## 2014-10-30 LAB — URINALYSIS W MICROSCOPIC + REFLEX CULTURE
BILIRUBIN URINE: NEGATIVE
CASTS: NONE SEEN [LPF]
Crystals: NONE SEEN [HPF]
GLUCOSE, UA: NEGATIVE
Hgb urine dipstick: NEGATIVE
Ketones, ur: NEGATIVE
Leukocytes, UA: NEGATIVE
NITRITE: NEGATIVE
PH: 6 (ref 5.0–8.0)
Protein, ur: NEGATIVE
RBC / HPF: NONE SEEN RBC/HPF (ref <=2)
SPECIFIC GRAVITY, URINE: 1.015 (ref 1.001–1.035)
WBC UA: NONE SEEN WBC/HPF (ref <=5)
Yeast: NONE SEEN [HPF]

## 2014-10-30 LAB — WET PREP FOR TRICH, YEAST, CLUE
Trich, Wet Prep: NONE SEEN
Yeast Wet Prep HPF POC: NONE SEEN

## 2014-10-30 MED ORDER — METRONIDAZOLE 0.75 % VA GEL: VAGINAL | Status: DC
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
## 2014-10-30 NOTE — Patient Instructions (Signed)
Health Maintenance Adopting a healthy lifestyle and getting preventive care can go a long way to promote health and wellness. Talk with your health care provider about what schedule of regular examinations is right for you. This is a good chance for you to check in with your provider about disease prevention and staying healthy. In between checkups, there are plenty of things you can do on your own. Experts have done a lot of research about which lifestyle changes and preventive measures are most likely to keep you healthy. Ask your health care provider for more information. WEIGHT AND DIET  Eat a healthy diet  Be sure to include plenty of vegetables, fruits, low-fat dairy products, and lean protein.  Do not eat a lot of foods high in solid fats, added sugars, or salt.  Get regular exercise. This is one of the most important things you can do for your health.  Most adults should exercise for at least 150 minutes each week. The exercise should increase your heart rate and make you sweat (moderate-intensity exercise).  Most adults should also do strengthening exercises at least twice a week. This is in addition to the moderate-intensity exercise.  Maintain a healthy weight  Body mass index (BMI) is a measurement that can be used to identify possible weight problems. It estimates body fat based on height and weight. Your health care provider can help determine your BMI and help you achieve or maintain a healthy weight.  For females 25 years of age and older:   A BMI below 18.5 is considered underweight.  A BMI of 18.5 to 24.9 is normal.  A BMI of 25 to 29.9 is considered overweight.  A BMI of 30 and above is considered obese.  Watch levels of cholesterol and blood lipids  You should start having your blood tested for lipids and cholesterol at 45 years of age, then have this test every 5 years.  You may need to have your cholesterol levels checked more often if:  Your lipid or  cholesterol levels are high.  You are older than 45 years of age.  You are at high risk for heart disease.  CANCER SCREENING   Lung Cancer  Lung cancer screening is recommended for adults 97-92 years old who are at high risk for lung cancer because of a history of smoking.  A yearly low-dose CT scan of the lungs is recommended for people who:  Currently smoke.  Have quit within the past 15 years.  Have at least a 30-pack-year history of smoking. A pack year is smoking an average of one pack of cigarettes a day for 1 year.  Yearly screening should continue until it has been 15 years since you quit.  Yearly screening should stop if you develop a health problem that would prevent you from having lung cancer treatment.  Breast Cancer  Practice breast self-awareness. This means understanding how your breasts normally appear and feel.  It also means doing regular breast self-exams. Let your health care provider know about any changes, no matter how small.  If you are in your 20s or 30s, you should have a clinical breast exam (CBE) by a health care provider every 1-3 years as part of a regular health exam.  If you are 76 or older, have a CBE every year. Also consider having a breast X-ray (mammogram) every year.  If you have a family history of breast cancer, talk to your health care provider about genetic screening.  If you are  at high risk for breast cancer, talk to your health care provider about having an MRI and a mammogram every year.  Breast cancer gene (BRCA) assessment is recommended for women who have family members with BRCA-related cancers. BRCA-related cancers include:  Breast.  Ovarian.  Tubal.  Peritoneal cancers.  Results of the assessment will determine the need for genetic counseling and BRCA1 and BRCA2 testing. Cervical Cancer Routine pelvic examinations to screen for cervical cancer are no longer recommended for nonpregnant women who are considered low  risk for cancer of the pelvic organs (ovaries, uterus, and vagina) and who do not have symptoms. A pelvic examination may be necessary if you have symptoms including those associated with pelvic infections. Ask your health care provider if a screening pelvic exam is right for you.   The Pap test is the screening test for cervical cancer for women who are considered at risk.  If you had a hysterectomy for a problem that was not cancer or a condition that could lead to cancer, then you no longer need Pap tests.  If you are older than 65 years, and you have had normal Pap tests for the past 10 years, you no longer need to have Pap tests.  If you have had past treatment for cervical cancer or a condition that could lead to cancer, you need Pap tests and screening for cancer for at least 20 years after your treatment.  If you no longer get a Pap test, assess your risk factors if they change (such as having a new sexual partner). This can affect whether you should start being screened again.  Some women have medical problems that increase their chance of getting cervical cancer. If this is the case for you, your health care provider may recommend more frequent screening and Pap tests.  The human papillomavirus (HPV) test is another test that may be used for cervical cancer screening. The HPV test looks for the virus that can cause cell changes in the cervix. The cells collected during the Pap test can be tested for HPV.  The HPV test can be used to screen women 30 years of age and older. Getting tested for HPV can extend the interval between normal Pap tests from three to five years.  An HPV test also should be used to screen women of any age who have unclear Pap test results.  After 45 years of age, women should have HPV testing as often as Pap tests.  Colorectal Cancer  This type of cancer can be detected and often prevented.  Routine colorectal cancer screening usually begins at 45 years of  age and continues through 45 years of age.  Your health care provider may recommend screening at an earlier age if you have risk factors for colon cancer.  Your health care provider may also recommend using home test kits to check for hidden blood in the stool.  A small camera at the end of a tube can be used to examine your colon directly (sigmoidoscopy or colonoscopy). This is done to check for the earliest forms of colorectal cancer.  Routine screening usually begins at age 50.  Direct examination of the colon should be repeated every 5-10 years through 45 years of age. However, you may need to be screened more often if early forms of precancerous polyps or small growths are found. Skin Cancer  Check your skin from head to toe regularly.  Tell your health care provider about any new moles or changes in   moles, especially if there is a change in a mole's shape or color.  Also tell your health care provider if you have a mole that is larger than the size of a pencil eraser.  Always use sunscreen. Apply sunscreen liberally and repeatedly throughout the day.  Protect yourself by wearing long sleeves, pants, a wide-brimmed hat, and sunglasses whenever you are outside. HEART DISEASE, DIABETES, AND HIGH BLOOD PRESSURE   Have your blood pressure checked at least every 1-2 years. High blood pressure causes heart disease and increases the risk of stroke.  If you are between 75 years and 42 years old, ask your health care provider if you should take aspirin to prevent strokes.  Have regular diabetes screenings. This involves taking a blood sample to check your fasting blood sugar level.  If you are at a normal weight and have a low risk for diabetes, have this test once every three years after 45 years of age.  If you are overweight and have a high risk for diabetes, consider being tested at a younger age or more often. PREVENTING INFECTION  Hepatitis B  If you have a higher risk for  hepatitis B, you should be screened for this virus. You are considered at high risk for hepatitis B if:  You were born in a country where hepatitis B is common. Ask your health care provider which countries are considered high risk.  Your parents were born in a high-risk country, and you have not been immunized against hepatitis B (hepatitis B vaccine).  You have HIV or AIDS.  You use needles to inject street drugs.  You live with someone who has hepatitis B.  You have had sex with someone who has hepatitis B.  You get hemodialysis treatment.  You take certain medicines for conditions, including cancer, organ transplantation, and autoimmune conditions. Hepatitis C  Blood testing is recommended for:  Everyone born from [birth year] through 1966.  Anyone with known risk factors for hepatitis C. Sexually transmitted infections (STIs)  You should be screened for sexually transmitted infections (STIs) including gonorrhea and chlamydia if:  You are sexually active and are younger than 45 years of age.  You are older than 45 years of age and your health care provider tells you that you are at risk for this type of infection.  Your sexual activity has changed since you were last screened and you are at an increased risk for chlamydia or gonorrhea. Ask your health care provider if you are at risk.  If you do not have HIV, but are at risk, it may be recommended that you take a prescription medicine daily to prevent HIV infection. This is called pre-exposure prophylaxis (PrEP). You are considered at risk if:  You are sexually active and do not regularly use condoms or know the HIV status of your partner(s).  You take drugs by injection.  You are sexually active with a partner who has HIV. Talk with your health care provider about whether you are at high risk of being infected with HIV. If you choose to begin PrEP, you should first be tested for HIV. You should then be tested every 3 months for  as long as you are taking PrEP.  PREGNANCY   If you are premenopausal and you may become pregnant, ask your health care provider about preconception counseling.  If you may become pregnant, take 400 to 800 micrograms (mcg) of folic acid every day.  If you want to prevent pregnancy, talk to your  health care provider about birth control (contraception). OSTEOPOROSIS AND MENOPAUSE   Osteoporosis is a disease in which the bones lose minerals and strength with aging. This can result in serious bone fractures. Your risk for osteoporosis can be identified using a bone density scan.  If you are 46 years of age or older, or if you are at risk for osteoporosis and fractures, ask your health care provider if you should be screened.  Ask your health care provider whether you should take a calcium or vitamin D supplement to lower your risk for osteoporosis.  Menopause may have certain physical symptoms and risks.  Hormone replacement therapy may reduce some of these symptoms and risks. Talk to your health care provider about whether hormone replacement therapy is right for you.  HOME CARE INSTRUCTIONS   Schedule regular health, dental, and eye exams.  Stay current with your immunizations.   Do not use any tobacco products including cigarettes, chewing tobacco, or electronic cigarettes.  If you are pregnant, do not drink alcohol.  If you are breastfeeding, limit how much and how often you drink alcohol.  Limit alcohol intake to no more than 1 drink per day for nonpregnant women. One drink equals 12 ounces of beer, 5 ounces of wine, or 1 ounces of hard liquor.  Do not use street drugs.  Do not share needles.  Ask your health care provider for help if you need support or information about quitting drugs.  Tell your health care provider if you often feel depressed.  Tell your health care provider if you have ever been abused or do not feel safe at home. Document Released: 09/13/2010  Document Revised: 07/15/2013 Document Reviewed: 01/30/2013 [facility] Patient Information 2015 [place], [place]. This information is not intended to replace advice given to you by your health care provider. Make sure you discuss any questions you have with your health care provider. Bacterial Vaginosis Bacterial vaginosis is a vaginal infection that occurs when the normal balance of bacteria in the vagina is disrupted. It results from an overgrowth of certain bacteria. This is the most common vaginal infection in women of childbearing age. Treatment is important to prevent complications, especially in pregnant women, as it can cause a premature delivery. CAUSES  Bacterial vaginosis is caused by an increase in harmful bacteria that are normally present in smaller amounts in the vagina. Several different kinds of bacteria can cause bacterial vaginosis. However, the reason that the condition develops is not fully understood. RISK FACTORS Certain activities or behaviors can put you at an increased risk of developing bacterial vaginosis, including:  Having a new sex partner or multiple sex partners.  Douching.  Using an intrauterine device (IUD) for contraception. Women do not get bacterial vaginosis from toilet seats, bedding, swimming pools, or contact with objects around them. SIGNS AND SYMPTOMS  Some women with bacterial vaginosis have no signs or symptoms. Common symptoms include:  Grey vaginal discharge.  A fishlike odor with discharge, especially after sexual intercourse.  Itching or burning of the vagina and vulva.  Burning or pain with urination. DIAGNOSIS  Your health care provider will take a medical history and examine the vagina for signs of bacterial vaginosis. A sample of vaginal fluid may be taken. Your health care provider will look at this sample under a microscope to check for bacteria and abnormal cells. A vaginal pH test may also be done.  TREATMENT  Bacterial vaginosis may  be treated with antibiotic medicines. These may be given in the  form of a pill or a vaginal cream. A second round of antibiotics may be prescribed if the condition comes back after treatment.  HOME CARE INSTRUCTIONS   Only take over-the-counter or prescription medicines as directed by your health care provider.  If antibiotic medicine was prescribed, take it as directed. Make sure you finish it even if you start to feel better.  Do not have sex until treatment is completed.  Tell all sexual partners that you have a vaginal infection. They should see their health care provider and be treated if they have problems, such as a mild rash or itching.  Practice safe sex by using condoms and only having one sex partner. SEEK MEDICAL CARE IF:   Your symptoms are not improving after 3 days of treatment.  You have increased discharge or pain.  You have a fever. MAKE SURE YOU:   Understand these instructions.  Will watch your condition.  Will get help right away if you are not doing well or get worse. FOR MORE INFORMATION  Centers for Disease Control and Prevention, Division of STD Prevention: [employer]raud[employer] American Sexual Health Association (ASHA): www.ashastd.org  Document Released: 02/28/2005 Document Revised: 12/19/2012 Document Reviewed: 10/10/2012 [facility] Patient Information 2015 [place], [place]. This information is not intended to replace advice given to you by your health care provider. Make sure you discuss any questions you have with your health care provider.

## 2014-10-30 NOTE — Telephone Encounter (Signed)
lmptcb to d/w pt about referral to Dr. Cyndia Bent per Dr. Radford Pax dx AI per TEE

## 2014-10-31 ENCOUNTER — Telehealth: Payer: Self-pay | Admitting: Cardiology

## 2014-10-31 LAB — URINE CULTURE
COLONY COUNT: NO GROWTH
Organism ID, Bacteria: NO GROWTH

## 2014-10-31 LAB — CYTOLOGY - PAP

## 2014-10-31 NOTE — Telephone Encounter (Signed)
New message ° ° ° ° ° °Returning a nurses call °

## 2014-10-31 NOTE — Telephone Encounter (Signed)
Returned patient's call. Asked patient what she thought the call was about, and patient stated she was not sure. Informed patient it might have been the phone call about her referral for cardiothoracic surgery, and that has been taken care of with an appointment at the end of the month with Dr. Cyndia Bent. Patient stated she also received a call from her gynecology office and she will call them. Informed patient that we would call her with any results or notes from doctor if any occur. Patient verbalized understanding.

## 2014-11-03 NOTE — Telephone Encounter (Signed)
Confirmed Dr. Cyndia Bent OV with patient on 8/31.

## 2014-11-08 NOTE — Assessment & Plan Note (Signed)
Patient is in for follow up and is now established with cardiology no new concerns. Just underwent a TEE and is waiting for a decision about proceeding with surgery.

## 2014-11-12 ENCOUNTER — Other Ambulatory Visit: Payer: Self-pay | Admitting: *Deleted

## 2014-11-12 ENCOUNTER — Institutional Professional Consult (permissible substitution) (INDEPENDENT_AMBULATORY_CARE_PROVIDER_SITE_OTHER): Payer: PRIVATE HEALTH INSURANCE | Admitting: Surgery

## 2014-11-12 ENCOUNTER — Encounter: Payer: Self-pay | Admitting: Surgery

## 2014-11-12 VITALS — BP 153/91 | HR 93 | Resp 16 | Ht 64.0 in | Wt 209.0 lb

## 2014-11-12 DIAGNOSIS — I351 Nonrheumatic aortic (valve) insufficiency: Secondary | ICD-10-CM

## 2014-11-15 ENCOUNTER — Encounter: Payer: Self-pay | Admitting: Surgery
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
## 2014-11-18 ENCOUNTER — Other Ambulatory Visit: Payer: Self-pay | Admitting: Cardiology

## 2014-11-18 ENCOUNTER — Ambulatory Visit
Admission: RE | Admit: 2014-11-18 | Discharge: 2014-11-18 | Disposition: A | Discharge status: Home / Self Care | Payer: PRIVATE HEALTH INSURANCE | Source: Ambulatory Visit | Attending: Surgery | Admitting: Surgery

## 2014-11-18 ENCOUNTER — Telehealth: Payer: Self-pay

## 2014-11-18 DIAGNOSIS — I351 Nonrheumatic aortic (valve) insufficiency: Secondary | ICD-10-CM

## 2014-11-18 DIAGNOSIS — R0602 Shortness of breath: Secondary | ICD-10-CM

## 2014-11-18 DIAGNOSIS — Q244 Congenital subaortic stenosis: Secondary | ICD-10-CM

## 2014-11-18 DIAGNOSIS — Z01812 Encounter for preprocedural laboratory examination: Secondary | ICD-10-CM

## 2014-11-18 MED ORDER — IOPAMIDOL (ISOVUE-370) INJECTION 76%
75.0000 mL | Freq: Once | INTRAVENOUS | Status: AC | PRN
  Administered 2014-11-18: 75 mL via INTRAVENOUS

## 2014-11-18 NOTE — Telephone Encounter (Signed)
Please set up for right and left heart cath         [name]    ----- Message -----     From: Gaye Pollack, MD     Sent: 11/15/2014  9:37 AM      To: Sueanne Margarita, MD        Thanks for sending her [name]. She will need a cath and I have ordered a CTA of the chest to assess her aorta.     Scheduled patient for R and L heart catheterization Tuesday, November 25, 2014 with Dr. Tamala Julian. Patient understands to come Thursday, 11/20/14 for pre-procedure lab work and to pick up instruction letter.

## 2014-11-20 ENCOUNTER — Other Ambulatory Visit (INDEPENDENT_AMBULATORY_CARE_PROVIDER_SITE_OTHER): Payer: PRIVATE HEALTH INSURANCE | Admitting: *Deleted

## 2014-11-20 DIAGNOSIS — R0602 Shortness of breath: Secondary | ICD-10-CM

## 2014-11-20 DIAGNOSIS — Q244 Congenital subaortic stenosis: Secondary | ICD-10-CM

## 2014-11-20 DIAGNOSIS — Z01812 Encounter for preprocedural laboratory examination: Secondary | ICD-10-CM | POA: Diagnosis not present

## 2014-11-20 DIAGNOSIS — I351 Nonrheumatic aortic (valve) insufficiency: Secondary | ICD-10-CM | POA: Diagnosis not present

## 2014-11-20 LAB — CBC WITH DIFFERENTIAL/PLATELET
Basophils Absolute: 0.1 10*3/uL (ref 0.0–0.1)
Basophils Relative: 0.7 % (ref 0.0–3.0)
EOS PCT: 3.3 % (ref 0.0–5.0)
Eosinophils Absolute: 0.3 10*3/uL (ref 0.0–0.7)
HCT: 37.2 % (ref 36.0–46.0)
HEMOGLOBIN: 12.6 g/dL (ref 12.0–15.0)
Lymphocytes Relative: 31.3 % (ref 12.0–46.0)
Lymphs Abs: 2.5 10*3/uL (ref 0.7–4.0)
MCHC: 33.9 g/dL (ref 30.0–36.0)
MCV: 95.4 fl (ref 78.0–100.0)
MONOS PCT: 6.5 % (ref 3.0–12.0)
Monocytes Absolute: 0.5 10*3/uL (ref 0.1–1.0)
Neutro Abs: 4.6 10*3/uL (ref 1.4–7.7)
Neutrophils Relative %: 58.2 % (ref 43.0–77.0)
Platelets: 271 10*3/uL (ref 150.0–400.0)
RBC: 3.9 Mil/uL (ref 3.87–5.11)
RDW: 12.9 % (ref 11.5–15.5)
WBC: 7.9 10*3/uL (ref 4.0–10.5)

## 2014-11-20 LAB — BASIC METABOLIC PANEL
BUN: 11 mg/dL (ref 6–23)
CO2: 27 mEq/L (ref 19–32)
Calcium: 9.6 mg/dL (ref 8.4–10.5)
Chloride: 101 mEq/L (ref 96–112)
Creatinine, Ser: 0.83 mg/dL (ref 0.40–1.20)
GFR: 78.95 mL/min (ref >60.00)
Glucose, Bld: 98 mg/dL (ref 70–99)
POTASSIUM: 3.9 meq/L (ref 3.5–5.1)
SODIUM: 136 meq/L (ref 135–145)

## 2014-11-20 LAB — PROTIME-INR
INR: 1 ratio (ref 0.8–1.0)
PROTHROMBIN TIME: 10.9 s (ref 9.6–13.1)

## 2014-11-20 LAB — APTT: APTT: 29.8 s (ref 23.4–32.7)

## 2014-11-25 ENCOUNTER — Ambulatory Visit (HOSPITAL_COMMUNITY)
Admission: RE | Admit: 2014-11-25 | Discharge: 2014-11-25 | Disposition: A | Discharge status: Home / Self Care | Payer: PRIVATE HEALTH INSURANCE | Source: Ambulatory Visit | Attending: Interventional Cardiology | Admitting: Interventional Cardiology

## 2014-11-25 ENCOUNTER — Encounter (HOSPITAL_COMMUNITY)
Admission: RE | Disposition: A | Discharge status: Home / Self Care | Payer: Self-pay | Source: Ambulatory Visit | Attending: Interventional Cardiology

## 2014-11-25 DIAGNOSIS — E785 Hyperlipidemia, unspecified: Secondary | ICD-10-CM | POA: Diagnosis present

## 2014-11-25 DIAGNOSIS — I351 Nonrheumatic aortic (valve) insufficiency: Secondary | ICD-10-CM

## 2014-11-25 DIAGNOSIS — I359 Nonrheumatic aortic valve disorder, unspecified: Secondary | ICD-10-CM | POA: Diagnosis present

## 2014-11-25 DIAGNOSIS — Q244 Congenital subaortic stenosis: Secondary | ICD-10-CM

## 2014-11-25 HISTORY — PX: CARDIAC CATHETERIZATION: SHX172

## 2014-11-25 LAB — POCT I-STAT 3, VENOUS BLOOD GAS (G3P V)
ACID-BASE DEFICIT: 5 mmol/L — AB (ref 0.0–2.0)
BICARBONATE: 20 meq/L (ref 20.0–24.0)
O2 Saturation: 70 %
PH VEN: 7.343 — AB (ref 7.250–7.300)
TCO2: 21 mmol/L (ref 0–100)
pCO2, Ven: 36.7 mmHg — ABNORMAL LOW (ref 45.0–50.0)
pO2, Ven: 38 mmHg (ref 30.0–45.0)

## 2014-11-25 LAB — POCT I-STAT 3, ART BLOOD GAS (G3+)
Acid-base deficit: 4 mmol/L — ABNORMAL HIGH (ref 0.0–2.0)
BICARBONATE: 20.7 meq/L (ref 20.0–24.0)
O2 SAT: 94 %
PCO2 ART: 37.5 mmHg (ref 35.0–45.0)
PH ART: 7.351 (ref 7.350–7.450)
TCO2: 22 mmol/L (ref 0–100)
pO2, Arterial: 73 mmHg — ABNORMAL LOW (ref 80.0–100.0)

## 2014-11-25 LAB — PREGNANCY, URINE: Preg Test, Ur: NEGATIVE

## 2014-11-25 SURGERY — RIGHT/LEFT HEART CATH AND CORONARY ANGIOGRAPHY
Anesthesia: LOCAL

## 2014-11-25 MED ORDER — SODIUM CHLORIDE 0.9 % WEIGHT BASED INFUSION
3.0000 mL/kg/h | INTRAVENOUS | Status: DC
  Administered 2014-11-25: 3 mL/kg/h via INTRAVENOUS

## 2014-11-25 MED ORDER — FENTANYL CITRATE (PF) 100 MCG/2ML IJ SOLN
INTRAMUSCULAR | Status: AC
  Filled 2014-11-25: qty 4

## 2014-11-25 MED ORDER — SODIUM CHLORIDE 0.9 % WEIGHT BASED INFUSION: 1.0000 mL/kg/h | INTRAVENOUS | Status: DC

## 2014-11-25 MED ORDER — MIDAZOLAM HCL 2 MG/2ML IJ SOLN
INTRAMUSCULAR | Status: AC
  Filled 2014-11-25: qty 4

## 2014-11-25 MED ORDER — SODIUM CHLORIDE 0.9 % IJ SOLN: 3.0000 mL | INTRAMUSCULAR | Status: DC | PRN

## 2014-11-25 MED ORDER — LIDOCAINE HCL (PF) 1 % IJ SOLN
INTRAMUSCULAR | Status: DC | PRN
  Administered 2014-11-25: 5 mL via SUBCUTANEOUS

## 2014-11-25 MED ORDER — ASPIRIN 81 MG PO CHEW
81.0000 mg | CHEWABLE_TABLET | ORAL | Status: AC
  Administered 2014-11-25: 81 mg via ORAL

## 2014-11-25 MED ORDER — OXYCODONE-ACETAMINOPHEN 5-325 MG PO TABS: 1.0000 | ORAL_TABLET | ORAL | Status: DC | PRN

## 2014-11-25 MED ORDER — SODIUM CHLORIDE 0.9 % IV SOLN: 250.0000 mL | INTRAVENOUS | Status: DC | PRN

## 2014-11-25 MED ORDER — SODIUM CHLORIDE 0.9 % WEIGHT BASED INFUSION: 3.0000 mL/kg/h | INTRAVENOUS | Status: AC

## 2014-11-25 MED ORDER — FENTANYL CITRATE (PF) 100 MCG/2ML IJ SOLN
INTRAMUSCULAR | Status: DC | PRN
  Administered 2014-11-25 (×2): 50 ug via INTRAVENOUS

## 2014-11-25 MED ORDER — VERAPAMIL HCL 2.5 MG/ML IV SOLN
INTRAVENOUS | Status: AC
  Filled 2014-11-25: qty 2

## 2014-11-25 MED ORDER — ONDANSETRON HCL 4 MG/2ML IJ SOLN: 4.0000 mg | Freq: Four times a day (QID) | INTRAMUSCULAR | Status: DC | PRN

## 2014-11-25 MED ORDER — LIDOCAINE HCL (PF) 1 % IJ SOLN
INTRAMUSCULAR | Status: DC | PRN
  Administered 2014-11-25: 13:00:00

## 2014-11-25 MED ORDER — SODIUM CHLORIDE 0.9 % IJ SOLN: 3.0000 mL | Freq: Two times a day (BID) | INTRAMUSCULAR | Status: DC

## 2014-11-25 MED ORDER — MIDAZOLAM HCL 2 MG/2ML IJ SOLN
INTRAMUSCULAR | Status: DC | PRN
  Administered 2014-11-25 (×4): 1 mg via INTRAVENOUS

## 2014-11-25 MED ORDER — LIDOCAINE HCL (PF) 1 % IJ SOLN
INTRAMUSCULAR | Status: AC
  Filled 2014-11-25: qty 30

## 2014-11-25 MED ORDER — ASPIRIN 81 MG PO CHEW
CHEWABLE_TABLET | ORAL | Status: AC
  Filled 2014-11-25: qty 1

## 2014-11-25 MED ORDER — IOHEXOL 350 MG/ML SOLN
INTRAVENOUS | Status: DC | PRN
  Administered 2014-11-25: 140 mL via INTRACARDIAC

## 2014-11-25 MED ORDER — ACETAMINOPHEN 325 MG PO TABS: 650.0000 mg | ORAL_TABLET | ORAL | Status: DC | PRN

## 2014-11-25 SURGICAL SUPPLY — 18 items
CATH BALLN WEDGE 5F 110CM (CATHETERS) ×2 IMPLANT
CATH INFINITI 5FR ANG PIGTAIL (CATHETERS) ×2 IMPLANT
CATH INFINITI 5FR JL4 (CATHETERS) ×2 IMPLANT
CATH SITESEER 5F MULTI A 2 (CATHETERS) ×2 IMPLANT
CATH SWAN GANZ 7F STRAIGHT (CATHETERS) ×2 IMPLANT
GLIDESHEATH SLEND A-KIT 6F 22G (SHEATH) ×2 IMPLANT
KIT HEART LEFT (KITS) ×2 IMPLANT
PACK CARDIAC CATHETERIZATION (CUSTOM PROCEDURE TRAY) ×2 IMPLANT
SHEATH FAST CATH BRACH 5F 5CM (SHEATH) ×2 IMPLANT
SHEATH PINNACLE 6F 10CM (SHEATH) ×2 IMPLANT
SHEATH PINNACLE 7F 10CM (SHEATH) ×2 IMPLANT
TRANSDUCER W/STOPCOCK (MISCELLANEOUS) ×2 IMPLANT
TUBING ART PRESS 72  MALE/FEM (TUBING) ×1
TUBING ART PRESS 72 MALE/FEM (TUBING) ×1 IMPLANT
TUBING CIL FLEX 10 FLL-RA (TUBING) ×2 IMPLANT
WIRE ASAHI PROWATER 300CM (WIRE) ×2 IMPLANT
WIRE EMERALD 3MM-J .035X150CM (WIRE) ×2 IMPLANT
WIRE SAFE-T 1.5MM-J .035X260CM (WIRE) ×2 IMPLANT
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
## 2014-11-25 NOTE — Progress Notes (Signed)
Report given to Tonya, Pt to SS rm 13

## 2014-11-25 NOTE — Interval H&P Note (Signed)
Cath Lab Visit (complete for each Cath Lab visit)  Clinical Evaluation Leading to the Procedure:   ACS: No.  Non-ACS:    Anginal Classification: CCS III  Anti-ischemic medical therapy: Minimal Therapy (1 class of medications)  Non-Invasive Test Results: No non-invasive testing performed  Prior CABG: No previous CABG      History and Physical Interval Note:  11/25/2014 7:14 AM  [name]  has presented today for surgery, with the diagnosis of sob, aortic stenosis  The various methods of treatment have been discussed with the patient and family. After consideration of risks, benefits and other options for treatment, the patient has consented to  Procedure(s): Right/Left Heart Cath and Coronary Angiography (N/A) as a surgical intervention .  The patient's history has been reviewed, patient examined, no change in status, stable for surgery.  I have reviewed the patient's chart and labs.  Questions were answered to the patient's satisfaction.     Sinclair Grooms

## 2014-11-25 NOTE — Progress Notes (Signed)
Site area: rt groin fa and fv sheaths pulled and manual pressure held by Mariea Clonts Site Prior to Removal:  Level  0 Pressure Applied For:  20 minutes Manual:   yes Patient Status During Pull:  stable Post Pull Site:  Level  0 Post Pull Instructions Given:  yes Post Pull Pulses Present: yes Dressing Applied:  tegaderm Bedrest begins @  1320 Comments:  0

## 2014-11-25 NOTE — Discharge Instructions (Signed)

## 2014-11-26 ENCOUNTER — Encounter (HOSPITAL_COMMUNITY): Payer: Self-pay | Admitting: Interventional Cardiology

## 2014-11-26 DIAGNOSIS — I351 Nonrheumatic aortic (valve) insufficiency: Secondary | ICD-10-CM | POA: Diagnosis not present

## 2014-12-03 ENCOUNTER — Ambulatory Visit (INDEPENDENT_AMBULATORY_CARE_PROVIDER_SITE_OTHER): Payer: PRIVATE HEALTH INSURANCE | Admitting: Surgery

## 2014-12-03 ENCOUNTER — Encounter: Payer: Self-pay | Admitting: Surgery

## 2014-12-03 ENCOUNTER — Other Ambulatory Visit: Payer: Self-pay | Admitting: *Deleted

## 2014-12-03 ENCOUNTER — Encounter: Payer: Self-pay | Admitting: Family Medicine

## 2014-12-03 VITALS — BP 157/96 | HR 88 | Resp 16 | Ht 64.0 in | Wt 209.0 lb

## 2014-12-03 DIAGNOSIS — I351 Nonrheumatic aortic (valve) insufficiency: Secondary | ICD-10-CM | POA: Diagnosis not present

## 2014-12-03 NOTE — Progress Notes (Signed)
During a follow up visit to see Dr. Cyndia Bent, Ms. [name] showed me her right arm where there had been two dressings applied after her recent heart cath.  There was obvious residual redness and dermatiits at the radial and brachial areas. The areas were square in shape.  She says she applied cortisone and a steroid cream to these sites and they are much improved than initially when they had small blisters.  She said it had been a clear dressing and we decided it must have been a Tegaderm-type occlusive dressing.  I have made allergy notation in her chart.

## 2014-12-04 ENCOUNTER — Encounter: Payer: Self-pay | Admitting: Surgery

## 2014-12-04 ENCOUNTER — Other Ambulatory Visit: Payer: Self-pay | Admitting: Family Medicine

## 2014-12-04 MED ORDER — AMOXICILLIN 500 MG PO CAPS: ORAL_CAPSULE | ORAL | Status: DC

## 2014-12-04 NOTE — Progress Notes (Signed)
HPI:  She returns today to discuss the results of her cardiac cath the chest CTA and to plan surgery. I reviewed both studies with her. Her cath shows normal coronaries with severe AI and normal cardiac output of 6.3 with normal LVEDP of 10 mm Hg. Her EF is 55%. Her CTA shows enlargement of the ascending aorta from the sinotubular junction to the innominate artery with a maximum diameter of 4.2 cm. The mid arch is 2.7 cm and the descending aorta is 2.2 cm. She has been feeling about the same with exertional dyspnea and fatigue.   Current Outpatient Prescriptions  Medication Sig Dispense Refill  . amphetamine-dextroamphetamine (ADDERALL) 20 MG tablet Take 1 tablet (20 mg total) by mouth 2 (two) times daily. September 2016 Rx (Patient taking differently: Take 20 mg by mouth daily as needed (sluggishness). September 2016 Rx) 60 tablet 0  . atorvastatin (LIPITOR) 10 MG tablet Take 1 tablet (10 mg total) by mouth daily. 30 tablet 3  . buPROPion (WELLBUTRIN XL) 300 MG 24 hr tablet Take 1 tablet (300 mg total) by mouth daily. 30 tablet 5  . cholecalciferol (VITAMIN D) 1000 UNITS tablet Take 1,000 Units by mouth daily.    Marland Kitchen ibuprofen (ADVIL,MOTRIN) 600 MG tablet Take 1 tablet (600 mg total) by mouth every 8 (eight) hours as needed. (Patient taking differently: Take 600 mg by mouth every 8 (eight) hours as needed (pain). ) 60 tablet 2  . omeprazole (PRILOSEC) 40 MG capsule Take 1 capsule (40 mg total) by mouth daily. 90 capsule 3  . Probiotic Product (PROBIOTIC DAILY) CAPS Take 1 capsule by mouth daily.     . vitamin C (ASCORBIC ACID) 500 MG tablet Take 500 mg by mouth daily.    Marland Kitchen amoxicillin (AMOXIL) 500 MG capsule Take 4 once 2 hours prior to dental procedure. 4 capsule 0   No current facility-administered medications for this visit.     Physical Exam: BP 157/96 mmHg  Pulse 88  Resp 16  Ht 5\' 4"  (1.626 m)  Wt 209 lb (94.802 kg)  BMI 35.86 kg/m2  SpO2 97% She looks well Lungs are  clear Cardiac exam shows a regular rate and rhythm with a 3/6 diastolic AI murmur at the apex.  There is no peripheral edema.  Diagnostic Tests:  Conclusion     Moderate to severe aortic regurgitation.  Normal coronary arteries.  Normal left ventricular and pulmonary artery pressures. Cardiac output by Fick determination 6.3 L/m.  Low-normal left ventricular systolic function with an estimated ejection fraction of 50-55%    Coronary Findings    Dominance: Co-dominant     Right Heart Pressures LV EDP is normal.    Wall Motion                 Left Heart    Aortic Valve There is no aortic valve stenosis, and moderate (3+) aortic regurgitation.   Aorta The ascending aorta is mildly dilated. Maximum aortic diameter: 4.5 cm.    Coronary Diagrams    Diagnostic Diagram            Implants    Name ID Temporary Type Supply   No information to display    Hemo Data       Most Recent Value   Fick Cardiac Output  6.34 L/min   Fick Cardiac Output Index  3.23 (L/min)/BSA   RA A Wave  8 mmHg   RA V Wave  8 mmHg   RA  Mean  6 mmHg   RV Systolic Pressure  30 mmHg   RV Diastolic Pressure  6 mmHg   RV EDP  10 mmHg   PA Systolic Pressure  25 mmHg   PA Diastolic Pressure  3 mmHg   PA Mean  18 mmHg   PW A Wave  9 mmHg   PW V Wave  6 mmHg   PW Mean  6 mmHg   AO Systolic Pressure  856 mmHg   AO Diastolic Pressure  64 mmHg   AO Mean  82 mmHg   LV Systolic Pressure  314 mmHg   LV Diastolic Pressure  8 mmHg   LV EDP  10 mmHg   Arterial Occlusion Pressure Extended Systolic Pressure  970 mmHg   Arterial Occlusion Pressure Extended Diastolic Pressure  65 mmHg   Arterial Occlusion Pressure Extended Mean Pressure  83 mmHg   Left Ventricular Apex Extended Systolic Pressure  263 mmHg   Left Ventricular Apex Extended Diastolic Pressure  2 mmHg   Left Ventricular Apex Extended EDP Pressure  9 mmHg   QP/QS  1   TPVR Index  5.56 HRUI    TSVR Index  25.36 HRUI   PVR SVR Ratio  0.16   TPVR/TSVR Ratio  0.22    Order-Level Documents:    There are no order-level documents.    Encounter-Level Documents - 11/18/14:      Scan on 11/27/2014 11:30 AM by Provider Default, MDScan on 11/27/2014 11:30 AM by Provider Default, MD     Scan on 11/27/2014 11:06 AM by Provider Default, MDScan on 11/27/2014 11:06 AM by Provider Default, MD     Scan on 11/25/2014 12:40 PM by Provider Default, MDScan on 11/25/2014 12:40 PM by Provider Default, MD     Electronic signature on 11/25/2014 7:08 AM    Signed    Electronically signed by Belva Crome, MD on 11/25/14 at 1253 EDT       CLINICAL DATA: 44 year old female under preoperative evaluation prior to potential aortic valve replacement. Fatigue, vertigo and shortness of breath on exertion. Prior history of surgery for aortic stenosis in 2001. History of left-sided breast cancer diagnosed in 2004 status post lumpectomy, chemotherapy and radiation therapy.  EXAM: CT ANGIOGRAPHY CHEST WITH CONTRAST  TECHNIQUE: Multidetector CT imaging of the chest was performed using the standard protocol during bolus administration of intravenous contrast. Multiplanar CT image reconstructions and MIPs were obtained to evaluate the vascular anatomy.  CONTRAST: 75 mL of Isovue 370.  COMPARISON: Chest CT 11/11/2009.  FINDINGS: Mediastinum/Lymph Nodes: Heart size is normal. There is no significant pericardial fluid, thickening or pericardial calcification. Thickening of the aortic valve. Ascending aorta appears mildly dilated, measuring up to 4.2 cm in diameter. Mid arch measures 2.7 cm in diameter, and the descending thoracic aorta measures 2.2 cm in diameter. No pathologically enlarged mediastinal or hilar lymph nodes. Some amorphous soft tissue in the anterior mediastinum has fatty stippling, favored to represent a small amount of residual thymic tissue. Esophagus is  unremarkable in appearance. No axillary lymphadenopathy.  Lungs/Pleura: No acute consolidative airspace disease. No pleural effusions. No suspicious appearing pulmonary nodules or masses.  Upper Abdomen: Diffuse low attenuation throughout the hepatic parenchyma, compatible with hepatic steatosis.  Musculoskeletal/Soft Tissues: There are no aggressive appearing lytic or blastic lesions noted in the visualized portions of the skeleton. Median sternotomy wires.  Review of the MIP images confirms the above findings.  IMPRESSION: 1. Ectasia of ascending thoracic aorta which measures up to  4.2 cm in diameter. Recommend annual imaging followup by CTA or MRA. This recommendation follows 2010 ACCF/AHA/AATS/ACR/ASA/SCA/SCAI/SIR/STS/SVM Guidelines for the Diagnosis and Management of Patients with Thoracic Aortic Disease. Circulation. 2010; 121: D470-L295. 2. Thickening of the aortic valve. 3. Hepatic steatosis.   Electronically Signed  By: Vinnie Langton M.D.  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 She had a normal aortic valve with no AI by echo in 04/2012. She has normal coronary arteries. 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 I think she will require Bentall procedure to replace the aortic valve and enlarged ascending aorta. Given her age of 38 I think a mechanical valved graft would be best. She has no contraindication to anticoagulation and is a compliant patient. I discussed the risk of pregnancy while on coumadin and that it is strictly contraindicated. She says that she does not plan to get pregnant. 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 I discussed the pros and cons of mechanical  and tissue valves and she is in full agreement with a mechanical valve. I discussed the operative procedure with the patient and family including alternatives, benefits and risks; including but not limited to bleeding, blood transfusion, infection, stroke, myocardial infarction, graft failure, heart block requiring a permanent pacemaker, organ dysfunction, and death.  [name] understands and agrees to proceed.    Plan:  We will schedule Bentall procedure using a mechanical valved graft for Nov 3 at her request.   Gaye Pollack, MD [facility] [phone]

## 2015-01-08 ENCOUNTER — Encounter: Payer: Self-pay | Admitting: Family Medicine

## 2015-01-09 ENCOUNTER — Other Ambulatory Visit: Payer: Self-pay | Admitting: Family Medicine

## 2015-01-09 MED ORDER — AMPHETAMINE-DEXTROAMPHETAMINE 20 MG PO TABS: 20.0000 mg | ORAL_TABLET | Freq: Two times a day (BID) | ORAL | Status: DC

## 2015-01-09 NOTE — Telephone Encounter (Signed)
Printed November 2016, December 2016 and January 2017 Adderall hardcopy's per PCP instructions.  The patient notified by mychart to pickup at convenience.

## 2015-01-13 ENCOUNTER — Ambulatory Visit (HOSPITAL_COMMUNITY)
Admission: RE | Admit: 2015-01-13 | Discharge: 2015-01-13 | Disposition: A | Discharge status: Home / Self Care | Payer: PRIVATE HEALTH INSURANCE | Source: Ambulatory Visit | Attending: Surgery | Admitting: Surgery

## 2015-01-13 ENCOUNTER — Encounter (HOSPITAL_COMMUNITY): Payer: Self-pay

## 2015-01-13 ENCOUNTER — Ambulatory Visit (HOSPITAL_BASED_OUTPATIENT_CLINIC_OR_DEPARTMENT_OTHER)
Admission: RE | Admit: 2015-01-13 | Discharge: 2015-01-13 | Disposition: A | Discharge status: Home / Self Care | Payer: PRIVATE HEALTH INSURANCE | Source: Ambulatory Visit | Attending: Surgery | Admitting: Surgery

## 2015-01-13 ENCOUNTER — Encounter (HOSPITAL_COMMUNITY)
Admission: RE | Admit: 2015-01-13 | Discharge: 2015-01-13 | Disposition: A | Discharge status: Home / Self Care | Payer: PRIVATE HEALTH INSURANCE | Source: Ambulatory Visit | Attending: Surgery | Admitting: Surgery

## 2015-01-13 VITALS — BP 136/82 | HR 88 | Temp 98.9°F | Resp 20 | Ht 64.0 in | Wt 206.8 lb

## 2015-01-13 DIAGNOSIS — Z01818 Encounter for other preprocedural examination: Secondary | ICD-10-CM | POA: Insufficient documentation

## 2015-01-13 DIAGNOSIS — Z01812 Encounter for preprocedural laboratory examination: Secondary | ICD-10-CM | POA: Diagnosis not present

## 2015-01-13 DIAGNOSIS — I350 Nonrheumatic aortic (valve) stenosis: Secondary | ICD-10-CM | POA: Insufficient documentation

## 2015-01-13 DIAGNOSIS — I4510 Unspecified right bundle-branch block: Secondary | ICD-10-CM | POA: Insufficient documentation

## 2015-01-13 DIAGNOSIS — R9431 Abnormal electrocardiogram [ECG] [EKG]: Secondary | ICD-10-CM | POA: Diagnosis not present

## 2015-01-13 DIAGNOSIS — I351 Nonrheumatic aortic (valve) insufficiency: Secondary | ICD-10-CM

## 2015-01-13 DIAGNOSIS — Z951 Presence of aortocoronary bypass graft: Secondary | ICD-10-CM | POA: Diagnosis not present

## 2015-01-13 DIAGNOSIS — I6523 Occlusion and stenosis of bilateral carotid arteries: Secondary | ICD-10-CM | POA: Insufficient documentation

## 2015-01-13 DIAGNOSIS — Z0183 Encounter for blood typing: Secondary | ICD-10-CM | POA: Insufficient documentation

## 2015-01-13 HISTORY — DX: Anxiety disorder, unspecified: F41.9

## 2015-01-13 HISTORY — DX: Major depressive disorder, single episode, unspecified: F32.9

## 2015-01-13 HISTORY — DX: Frequency of micturition: R35.0

## 2015-01-13 HISTORY — DX: Other constipation: K59.09

## 2015-01-13 HISTORY — DX: Depression, unspecified: F32.A

## 2015-01-13 HISTORY — DX: Other fatigue: R53.83

## 2015-01-13 LAB — PULMONARY FUNCTION TEST
DL/VA % pred: 106 %
DL/VA: 5.1 ml/min/mmHg/L
DLCO UNC % PRED: 95 %
DLCO UNC: 23.23 ml/min/mmHg
FEF 25-75 POST: 2.49 L/s
FEF 25-75 PRE: 2.03 L/s
FEF2575-%CHANGE-POST: 22 %
FEF2575-%PRED-POST: 84 %
FEF2575-%PRED-PRE: 69 %
FEV1-%Change-Post: 8 %
FEV1-%Pred-Post: 86 %
FEV1-%Pred-Pre: 79 %
FEV1-Post: 2.53 L
FEV1-Pre: 2.32 L
FEV1FVC-%CHANGE-POST: 7 %
FEV1FVC-%PRED-PRE: 90 %
FEV6-%CHANGE-POST: 0 %
FEV6-%Pred-Post: 90 %
FEV6-%Pred-Pre: 89 %
FEV6-PRE: 3.17 L
FEV6-Post: 3.19 L
FEV6FVC-%Change-Post: 0 %
FEV6FVC-%Pred-Post: 102 %
FEV6FVC-%Pred-Pre: 101 %
FVC-%CHANGE-POST: 0 %
FVC-%PRED-POST: 88 %
FVC-%Pred-Pre: 87 %
FVC-Post: 3.21 L
FVC-Pre: 3.18 L
POST FEV1/FVC RATIO: 79 %
PRE FEV1/FVC RATIO: 73 %
Post FEV6/FVC ratio: 100 %
Pre FEV6/FVC Ratio: 100 %
RV % pred: 91 %
RV: 1.55 L
TLC % pred: 92 %
TLC: 4.69 L

## 2015-01-13 LAB — COMPREHENSIVE METABOLIC PANEL
ALBUMIN: 4.2 g/dL (ref 3.5–5.0)
ALK PHOS: 40 U/L (ref 38–126)
ALT: 51 U/L (ref 14–54)
ANION GAP: 12 (ref 5–15)
AST: 35 U/L (ref 15–41)
BUN: 12 mg/dL (ref 6–20)
CALCIUM: 9.7 mg/dL (ref 8.9–10.3)
CO2: 19 mmol/L — AB (ref 22–32)
Chloride: 105 mmol/L (ref 101–111)
Creatinine, Ser: 0.71 mg/dL (ref 0.44–1.00)
GFR calc Af Amer: >60 mL/min (ref >60)
GFR calc non Af Amer: >60 mL/min (ref >60)
GLUCOSE: 111 mg/dL — AB (ref 65–99)
Potassium: 4.1 mmol/L (ref 3.5–5.1)
SODIUM: 136 mmol/L (ref 135–145)
Total Bilirubin: 1.1 mg/dL (ref 0.3–1.2)
Total Protein: 7.2 g/dL (ref 6.5–8.1)

## 2015-01-13 LAB — BLOOD GAS, ARTERIAL
Acid-base deficit: 1.5 mmol/L (ref 0.0–2.0)
Bicarbonate: 21.8 mEq/L (ref 20.0–24.0)
Drawn by: 421801
FIO2: 0.21
O2 SAT: 98.6 %
PATIENT TEMPERATURE: 98.6
TCO2: 22.7 mmol/L (ref 0–100)
pCO2 arterial: 31 mmHg — ABNORMAL LOW (ref 35.0–45.0)
pH, Arterial: 7.46 — ABNORMAL HIGH (ref 7.350–7.450)
pO2, Arterial: 125 mmHg — ABNORMAL HIGH (ref 80.0–100.0)

## 2015-01-13 LAB — CBC
HCT: 36.1 % (ref 36.0–46.0)
HEMOGLOBIN: 12.4 g/dL (ref 12.0–15.0)
MCH: 31.8 pg (ref 26.0–34.0)
MCHC: 34.3 g/dL (ref 30.0–36.0)
MCV: 92.6 fL (ref 78.0–100.0)
Platelets: 255 10*3/uL (ref 150–400)
RBC: 3.9 MIL/uL (ref 3.87–5.11)
RDW: 12.1 % (ref 11.5–15.5)
WBC: 5.2 10*3/uL (ref 4.0–10.5)

## 2015-01-13 LAB — URINALYSIS, ROUTINE W REFLEX MICROSCOPIC
BILIRUBIN URINE: NEGATIVE
GLUCOSE, UA: NEGATIVE mg/dL
HGB URINE DIPSTICK: NEGATIVE
KETONES UR: NEGATIVE mg/dL
Nitrite: NEGATIVE
PROTEIN: NEGATIVE mg/dL
Specific Gravity, Urine: 1.018 (ref 1.005–1.030)
Urobilinogen, UA: 0.2 mg/dL (ref 0.0–1.0)
pH: 5.5 (ref 5.0–8.0)

## 2015-01-13 LAB — PROTIME-INR
INR: 1.06 (ref 0.00–1.49)
Prothrombin Time: 14 seconds (ref 11.6–15.2)

## 2015-01-13 LAB — URINE MICROSCOPIC-ADD ON

## 2015-01-13 LAB — HCG, SERUM, QUALITATIVE: Preg, Serum: NEGATIVE

## 2015-01-13 LAB — SURGICAL PCR SCREEN
MRSA, PCR: NEGATIVE
Staphylococcus aureus: POSITIVE — AB

## 2015-01-13 LAB — APTT: aPTT: 26 seconds (ref 24–37)

## 2015-01-13 LAB — ABO/RH: ABO/RH(D): O POS

## 2015-01-13 MED ORDER — ALBUTEROL SULFATE (2.5 MG/3ML) 0.083% IN NEBU
2.5000 mg | INHALATION_SOLUTION | Freq: Once | RESPIRATORY_TRACT | Status: AC
  Administered 2015-01-13: 2.5 mg via RESPIRATORY_TRACT

## 2015-01-13 NOTE — Progress Notes (Signed)
Pre-op Cardiac Surgery  Carotid Findings:  Bilateral: No significant (1-39%) ICA stenosis. Antegrade vertebral flow.    Upper Extremity Right Left  Brachial Pressures 150 Not obtained, recent arterial stick  Radial Waveforms Tri Tri  Ulnar Waveforms Tri Tri  Palmar Arch (Allen's Test) Normal  Decreases >50% with radial compression, normal with ulnar compression   Landry Mellow, RDMS, RVT 01/13/2015

## 2015-01-13 NOTE — Progress Notes (Signed)
Nurse called Mupirocin ointment into [place], and then called and informed patient of positive PCR results. Patient verbalized understanding.

## 2015-01-13 NOTE — Pre-Procedure Instructions (Signed)
   Amel Kitch  01/13/2015     Your procedure is scheduled on : Thursday January 15, 2015 at 7:30 AM.  Report to [facility] Admitting at 5:30 AM.  Call this number if you have problems the morning of surgery: [phone]    Remember:  Do not eat food or drink liquids after midnight.  Take these medicines the morning of surgery with A SIP OF WATER : Acetaminophen (Tylenol), Bupropion (Wellbutrin), Omeprazole (Prilosec)   Stop taking any vitamins, herbal medications, Ibuprofen, Advil, Motrin, Aleve, etc   Do not wear jewelry, make-up or nail polish.  Do not wear lotions, powders, or perfumes.    Do not shave 48 hours prior to surgery.    Do not bring valuables to the hospital.  [facility] is not responsible for any belongings or valuables.  Contacts, dentures or bridgework may not be worn into surgery.  Leave your suitcase in the car.  After surgery it may be brought to your room.  For patients admitted to the hospital, discharge time will be determined by your treatment team.  Patients discharged the day of surgery will not be allowed to drive home.   Name and phone number of your driver:    Special instructions:  Shower using CHG soap the night before and the morning of your surgery  Please read over the following fact sheets that you were given. Pain Booklet, Coughing and Deep Breathing, Blood Transfusion Information, MRSA Information and Surgical Site Infection Prevention

## 2015-01-14 LAB — HEMOGLOBIN A1C
HEMOGLOBIN A1C: 5.8 % — AB (ref 4.8–5.6)
MEAN PLASMA GLUCOSE: 120 mg/dL

## 2015-01-14 MED ORDER — POTASSIUM CHLORIDE 2 MEQ/ML IV SOLN
80.0000 meq | INTRAVENOUS | Status: DC
  Filled 2015-01-14: qty 40

## 2015-01-14 MED ORDER — DEXMEDETOMIDINE HCL IN NACL 400 MCG/100ML IV SOLN
0.1000 ug/kg/h | INTRAVENOUS | Status: AC
  Administered 2015-01-15: .3 ug/kg/h via INTRAVENOUS
  Filled 2015-01-14: qty 100

## 2015-01-14 MED ORDER — DOPAMINE-DEXTROSE 3.2-5 MG/ML-% IV SOLN
0.0000 ug/kg/min | INTRAVENOUS | Status: AC
  Administered 2015-01-15: 2 ug/min via INTRAVENOUS
  Filled 2015-01-14 (×2): qty 250

## 2015-01-14 MED ORDER — SODIUM CHLORIDE 0.9 % IV SOLN
INTRAVENOUS | Status: AC
  Administered 2015-01-15: 1.1 [IU]/h via INTRAVENOUS
  Filled 2015-01-14: qty 2.5

## 2015-01-14 MED ORDER — SODIUM CHLORIDE 0.9 % IV SOLN
INTRAVENOUS | Status: DC
  Filled 2015-01-14: qty 30

## 2015-01-14 MED ORDER — VANCOMYCIN HCL 10 G IV SOLR
1500.0000 mg | INTRAVENOUS | Status: AC
  Administered 2015-01-15: 1500 mg via INTRAVENOUS
  Filled 2015-01-14: qty 1500

## 2015-01-14 MED ORDER — NITROGLYCERIN IN D5W 200-5 MCG/ML-% IV SOLN
2.0000 ug/min | INTRAVENOUS | Status: AC
  Administered 2015-01-15: 10 ug/min via INTRAVENOUS
  Filled 2015-01-14: qty 250

## 2015-01-14 MED ORDER — PHENYLEPHRINE HCL 10 MG/ML IJ SOLN
30.0000 ug/min | INTRAVENOUS | Status: DC
  Filled 2015-01-14: qty 2

## 2015-01-14 MED ORDER — PLASMA-LYTE 148 IV SOLN
INTRAVENOUS | Status: AC
  Administered 2015-01-15: 500 mL
  Filled 2015-01-14: qty 2.5

## 2015-01-14 MED ORDER — DEXTROSE 5 % IV SOLN
1.5000 g | INTRAVENOUS | Status: AC
  Administered 2015-01-15: 1.5 g via INTRAVENOUS
  Administered 2015-01-15: .75 g via INTRAVENOUS
  Filled 2015-01-14 (×2): qty 1.5

## 2015-01-14 MED ORDER — SODIUM CHLORIDE 0.9 % IV SOLN
INTRAVENOUS | Status: AC
  Administered 2015-01-15: 69.8 mL/h via INTRAVENOUS
  Filled 2015-01-14: qty 40

## 2015-01-14 MED ORDER — CHLORHEXIDINE GLUCONATE 0.12 % MT SOLN
15.0000 mL | Freq: Once | OROMUCOSAL | Status: DC
  Filled 2015-01-14: qty 15

## 2015-01-14 MED ORDER — EPINEPHRINE HCL 1 MG/ML IJ SOLN
0.0000 ug/min | INTRAVENOUS | Status: DC
  Filled 2015-01-14: qty 4

## 2015-01-14 MED ORDER — CHLORHEXIDINE GLUCONATE 4 % EX LIQD: 30.0000 mL | CUTANEOUS | Status: DC

## 2015-01-14 MED ORDER — DEXTROSE 5 % IV SOLN
750.0000 mg | INTRAVENOUS | Status: DC
  Filled 2015-01-14: qty 750

## 2015-01-14 MED ORDER — METOPROLOL TARTRATE 12.5 MG HALF TABLET
12.5000 mg | ORAL_TABLET | Freq: Once | ORAL | Status: AC
  Administered 2015-01-15: 12.5 mg via ORAL
  Filled 2015-01-14: qty 1

## 2015-01-14 MED ORDER — MAGNESIUM SULFATE 50 % IJ SOLN
40.0000 meq | INTRAMUSCULAR | Status: DC
Start: 2015-01-15 — End: 2015-01-15
  Filled 2015-01-14: qty 10
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
## 2015-01-15 ENCOUNTER — Encounter (HOSPITAL_COMMUNITY)
Admission: AD | Disposition: A | Discharge status: Home / Self Care | Payer: PRIVATE HEALTH INSURANCE | Source: Ambulatory Visit | Attending: Surgery

## 2015-01-15 ENCOUNTER — Inpatient Hospital Stay (HOSPITAL_COMMUNITY): Payer: PRIVATE HEALTH INSURANCE | Admitting: Certified Registered Nurse Anesthetist

## 2015-01-15 ENCOUNTER — Inpatient Hospital Stay (HOSPITAL_COMMUNITY): Payer: PRIVATE HEALTH INSURANCE

## 2015-01-15 ENCOUNTER — Inpatient Hospital Stay (HOSPITAL_COMMUNITY)
Admission: AD | Admit: 2015-01-15 | Discharge: 2015-01-21 | DRG: 221 | Disposition: A | Discharge status: Home / Self Care | Payer: PRIVATE HEALTH INSURANCE | Source: Ambulatory Visit | Attending: Surgery | Admitting: Surgery

## 2015-01-15 ENCOUNTER — Encounter (HOSPITAL_COMMUNITY): Payer: Self-pay | Admitting: *Deleted

## 2015-01-15 DIAGNOSIS — I351 Nonrheumatic aortic (valve) insufficiency: Secondary | ICD-10-CM

## 2015-01-15 DIAGNOSIS — R7303 Prediabetes: Secondary | ICD-10-CM | POA: Diagnosis present

## 2015-01-15 DIAGNOSIS — I712 Thoracic aortic aneurysm, without rupture: Secondary | ICD-10-CM | POA: Diagnosis present

## 2015-01-15 DIAGNOSIS — F988 Other specified behavioral and emotional disorders with onset usually occurring in childhood and adolescence: Secondary | ICD-10-CM | POA: Diagnosis present

## 2015-01-15 DIAGNOSIS — E669 Obesity, unspecified: Secondary | ICD-10-CM | POA: Diagnosis present

## 2015-01-15 DIAGNOSIS — Z952 Presence of prosthetic heart valve: Secondary | ICD-10-CM

## 2015-01-15 DIAGNOSIS — Z6835 Body mass index (BMI) 35.0-35.9, adult: Secondary | ICD-10-CM

## 2015-01-15 DIAGNOSIS — R0602 Shortness of breath: Secondary | ICD-10-CM

## 2015-01-15 DIAGNOSIS — I319 Disease of pericardium, unspecified: Secondary | ICD-10-CM | POA: Diagnosis not present

## 2015-01-15 HISTORY — PX: TEE WITHOUT CARDIOVERSION: SHX5443

## 2015-01-15 HISTORY — PX: BENTALL PROCEDURE: SHX5058

## 2015-01-15 LAB — POCT I-STAT, CHEM 8
BUN: 13 mg/dL (ref 6–20)
BUN: 11 mg/dL (ref 6–20)
BUN: 13 mg/dL (ref 6–20)
BUN: 12 mg/dL (ref 6–20)
BUN: 13 mg/dL (ref 6–20)
BUN: 13 mg/dL (ref 6–20)
BUN: 10 mg/dL (ref 6–20)
CALCIUM ION: 1.22 mmol/L (ref 1.12–1.23)
CALCIUM ION: 0.9 mmol/L — AB (ref 1.12–1.23)
CALCIUM ION: 0.96 mmol/L — AB (ref 1.12–1.23)
CALCIUM ION: 1.1 mmol/L — AB (ref 1.12–1.23)
CHLORIDE: 101 mmol/L (ref 101–111)
CHLORIDE: 101 mmol/L (ref 101–111)
CHLORIDE: 98 mmol/L — AB (ref 101–111)
CREATININE: 0.6 mg/dL (ref 0.44–1.00)
CREATININE: 0.4 mg/dL — AB (ref 0.44–1.00)
CREATININE: 0.5 mg/dL (ref 0.44–1.00)
CREATININE: 0.7 mg/dL (ref 0.44–1.00)
Calcium, Ion: 1.26 mmol/L — ABNORMAL HIGH (ref 1.12–1.23)
Calcium, Ion: 0.95 mmol/L — ABNORMAL LOW (ref 1.12–1.23)
Calcium, Ion: 0.94 mmol/L — ABNORMAL LOW (ref 1.12–1.23)
Chloride: 102 mmol/L (ref 101–111)
Chloride: 102 mmol/L (ref 101–111)
Chloride: 101 mmol/L (ref 101–111)
Chloride: 104 mmol/L (ref 101–111)
Creatinine, Ser: 0.6 mg/dL (ref 0.44–1.00)
Creatinine, Ser: 0.5 mg/dL (ref 0.44–1.00)
Creatinine, Ser: 0.6 mg/dL (ref 0.44–1.00)
GLUCOSE: 116 mg/dL — AB (ref 65–99)
GLUCOSE: 116 mg/dL — AB (ref 65–99)
GLUCOSE: 236 mg/dL — AB (ref 65–99)
GLUCOSE: 103 mg/dL — AB (ref 65–99)
Glucose, Bld: 96 mg/dL (ref 65–99)
Glucose, Bld: 137 mg/dL — ABNORMAL HIGH (ref 65–99)
Glucose, Bld: 163 mg/dL — ABNORMAL HIGH (ref 65–99)
HCT: 32 % — ABNORMAL LOW (ref 36.0–46.0)
HCT: 25 % — ABNORMAL LOW (ref 36.0–46.0)
HCT: 28 % — ABNORMAL LOW (ref 36.0–46.0)
HCT: 36 % (ref 36.0–46.0)
HEMATOCRIT: 34 % — AB (ref 36.0–46.0)
HEMATOCRIT: 25 % — AB (ref 36.0–46.0)
HEMATOCRIT: 28 % — AB (ref 36.0–46.0)
HEMOGLOBIN: 11.6 g/dL — AB (ref 12.0–15.0)
HEMOGLOBIN: 8.5 g/dL — AB (ref 12.0–15.0)
HEMOGLOBIN: 9.5 g/dL — AB (ref 12.0–15.0)
HEMOGLOBIN: 9.5 g/dL — AB (ref 12.0–15.0)
HEMOGLOBIN: 12.2 g/dL (ref 12.0–15.0)
Hemoglobin: 10.9 g/dL — ABNORMAL LOW (ref 12.0–15.0)
Hemoglobin: 8.5 g/dL — ABNORMAL LOW (ref 12.0–15.0)
POTASSIUM: 3.9 mmol/L (ref 3.5–5.1)
POTASSIUM: 4.2 mmol/L (ref 3.5–5.1)
POTASSIUM: 5.1 mmol/L (ref 3.5–5.1)
POTASSIUM: 3.5 mmol/L (ref 3.5–5.1)
Potassium: 4.2 mmol/L (ref 3.5–5.1)
Potassium: 6.2 mmol/L (ref 3.5–5.1)
Potassium: 7 mmol/L (ref 3.5–5.1)
SODIUM: 138 mmol/L (ref 135–145)
SODIUM: 140 mmol/L (ref 135–145)
SODIUM: 132 mmol/L — AB (ref 135–145)
SODIUM: 136 mmol/L (ref 135–145)
Sodium: 140 mmol/L (ref 135–145)
Sodium: 135 mmol/L (ref 135–145)
Sodium: 142 mmol/L (ref 135–145)
TCO2: 27 mmol/L (ref 0–100)
TCO2: 27 mmol/L (ref 0–100)
TCO2: 27 mmol/L (ref 0–100)
TCO2: 26 mmol/L (ref 0–100)
TCO2: 28 mmol/L (ref 0–100)
TCO2: 25 mmol/L (ref 0–100)
TCO2: 23 mmol/L (ref 0–100)

## 2015-01-15 LAB — POCT I-STAT 3, ART BLOOD GAS (G3+)
ACID-BASE DEFICIT: 3 mmol/L — AB (ref 0.0–2.0)
ACID-BASE DEFICIT: 2 mmol/L (ref 0.0–2.0)
ACID-BASE EXCESS: 11 mmol/L — AB (ref 0.0–2.0)
ACID-BASE EXCESS: 2 mmol/L (ref 0.0–2.0)
Acid-Base Excess: 1 mmol/L (ref 0.0–2.0)
Acid-Base Excess: 2 mmol/L (ref 0.0–2.0)
Acid-base deficit: 14 mmol/L — ABNORMAL HIGH (ref 0.0–2.0)
BICARBONATE: 34.8 meq/L — AB (ref 20.0–24.0)
BICARBONATE: 25.3 meq/L — AB (ref 20.0–24.0)
BICARBONATE: 24.7 meq/L — AB (ref 20.0–24.0)
BICARBONATE: 28.3 meq/L — AB (ref 20.0–24.0)
Bicarbonate: 14 mEq/L — ABNORMAL LOW (ref 20.0–24.0)
Bicarbonate: 27.8 mEq/L — ABNORMAL HIGH (ref 20.0–24.0)
Bicarbonate: 22.8 mEq/L (ref 20.0–24.0)
O2 SAT: 100 %
O2 SAT: 100 %
O2 SAT: 100 %
O2 SAT: 98 %
O2 SAT: 98 %
O2 SAT: 99 %
O2 Saturation: 100 %
PCO2 ART: 42.3 mmHg (ref 35.0–45.0)
PCO2 ART: 42.2 mmHg (ref 35.0–45.0)
PCO2 ART: 48.1 mmHg — AB (ref 35.0–45.0)
PH ART: 7.411 (ref 7.350–7.450)
PH ART: 7.389 (ref 7.350–7.450)
PO2 ART: 473 mmHg — AB (ref 80.0–100.0)
PO2 ART: 118 mmHg — AB (ref 80.0–100.0)
Patient temperature: 36.2
TCO2: 15 mmol/L (ref 0–100)
TCO2: 36 mmol/L (ref 0–100)
TCO2: 29 mmol/L (ref 0–100)
TCO2: 26 mmol/L (ref 0–100)
TCO2: 24 mmol/L (ref 0–100)
TCO2: 26 mmol/L (ref 0–100)
TCO2: 30 mmol/L (ref 0–100)
pCO2 arterial: 44 mmHg (ref 35.0–45.0)
pCO2 arterial: 53.1 mmHg — ABNORMAL HIGH (ref 35.0–45.0)
pCO2 arterial: 39.8 mmHg (ref 35.0–45.0)
pCO2 arterial: 46.6 mmHg — ABNORMAL HIGH (ref 35.0–45.0)
pH, Arterial: 7.127 — CL (ref 7.350–7.450)
pH, Arterial: 7.506 — ABNORMAL HIGH (ref 7.350–7.450)
pH, Arterial: 7.326 — ABNORMAL LOW (ref 7.350–7.450)
pH, Arterial: 7.339 — ABNORMAL LOW (ref 7.350–7.450)
pH, Arterial: 7.314 — ABNORMAL LOW (ref 7.350–7.450)
pO2, Arterial: 700 mmHg — ABNORMAL HIGH (ref 80.0–100.0)
pO2, Arterial: 519 mmHg — ABNORMAL HIGH (ref 80.0–100.0)
pO2, Arterial: 437 mmHg — ABNORMAL HIGH (ref 80.0–100.0)
pO2, Arterial: 115 mmHg — ABNORMAL HIGH (ref 80.0–100.0)
pO2, Arterial: 123 mmHg — ABNORMAL HIGH (ref 80.0–100.0)

## 2015-01-15 LAB — PREPARE RBC (CROSSMATCH)

## 2015-01-15 LAB — GLUCOSE, CAPILLARY
GLUCOSE-CAPILLARY: 105 mg/dL — AB (ref 65–99)
GLUCOSE-CAPILLARY: 110 mg/dL — AB (ref 65–99)
GLUCOSE-CAPILLARY: 126 mg/dL — AB (ref 65–99)
GLUCOSE-CAPILLARY: 145 mg/dL — AB (ref 65–99)
Glucose-Capillary: 133 mg/dL — ABNORMAL HIGH (ref 65–99)
Glucose-Capillary: 134 mg/dL — ABNORMAL HIGH (ref 65–99)
Glucose-Capillary: 123 mg/dL — ABNORMAL HIGH (ref 65–99)
Glucose-Capillary: 93 mg/dL (ref 65–99)

## 2015-01-15 LAB — MAGNESIUM: MAGNESIUM: 3.3 mg/dL — AB (ref 1.7–2.4)

## 2015-01-15 LAB — CBC
HCT: 42 % (ref 36.0–46.0)
HEMATOCRIT: 34.7 % — AB (ref 36.0–46.0)
Hemoglobin: 14.8 g/dL (ref 12.0–15.0)
Hemoglobin: 12.3 g/dL (ref 12.0–15.0)
MCH: 31.6 pg (ref 26.0–34.0)
MCH: 31.7 pg (ref 26.0–34.0)
MCHC: 35.2 g/dL (ref 30.0–36.0)
MCHC: 35.4 g/dL (ref 30.0–36.0)
MCV: 89.7 fL (ref 78.0–100.0)
MCV: 89.4 fL (ref 78.0–100.0)
PLATELETS: 152 10*3/uL (ref 150–400)
PLATELETS: 140 10*3/uL — AB (ref 150–400)
RBC: 4.68 MIL/uL (ref 3.87–5.11)
RBC: 3.88 MIL/uL (ref 3.87–5.11)
RDW: 13.4 % (ref 11.5–15.5)
RDW: 13.7 % (ref 11.5–15.5)
WBC: 17.5 10*3/uL — AB (ref 4.0–10.5)
WBC: 13.7 10*3/uL — AB (ref 4.0–10.5)

## 2015-01-15 LAB — CREATININE, SERUM
CREATININE: 0.84 mg/dL (ref 0.44–1.00)
GFR calc Af Amer: >60 mL/min (ref >60)

## 2015-01-15 LAB — HEMOGLOBIN AND HEMATOCRIT, BLOOD
HEMATOCRIT: 30.5 % — AB (ref 36.0–46.0)
HEMOGLOBIN: 10.8 g/dL — AB (ref 12.0–15.0)

## 2015-01-15 LAB — PROTIME-INR
INR: 1.4 (ref 0.00–1.49)
Prothrombin Time: 17.3 seconds — ABNORMAL HIGH (ref 11.6–15.2)

## 2015-01-15 LAB — POCT I-STAT 4, (NA,K, GLUC, HGB,HCT)
Glucose, Bld: 131 mg/dL — ABNORMAL HIGH (ref 65–99)
HEMATOCRIT: 42 % (ref 36.0–46.0)
Hemoglobin: 14.3 g/dL (ref 12.0–15.0)
Potassium: 3.8 mmol/L (ref 3.5–5.1)
SODIUM: 141 mmol/L (ref 135–145)

## 2015-01-15 LAB — PLATELET COUNT: PLATELETS: 159 10*3/uL (ref 150–400)

## 2015-01-15 LAB — APTT: APTT: 32 s (ref 24–37)

## 2015-01-15 SURGERY — BENTALL PROCEDURE
Anesthesia: General | Site: Chest

## 2015-01-15 MED ORDER — ARTIFICIAL TEARS OP OINT
TOPICAL_OINTMENT | OPHTHALMIC | Status: DC | PRN
  Administered 2015-01-15: 1 via OPHTHALMIC

## 2015-01-15 MED ORDER — LACTATED RINGERS IV SOLN: 500.0000 mL | Freq: Once | INTRAVENOUS | Status: DC | PRN

## 2015-01-15 MED ORDER — FENTANYL CITRATE (PF) 250 MCG/5ML IJ SOLN
INTRAMUSCULAR | Status: AC
  Filled 2015-01-15: qty 5

## 2015-01-15 MED ORDER — THROMBIN 20000 UNITS EX SOLR
CUTANEOUS | Status: AC
  Filled 2015-01-15: qty 20000

## 2015-01-15 MED ORDER — ACETAMINOPHEN 160 MG/5ML PO SOLN: 650.0000 mg | Freq: Once | ORAL | Status: AC

## 2015-01-15 MED ORDER — POTASSIUM CHLORIDE 10 MEQ/50ML IV SOLN
10.0000 meq | INTRAVENOUS | Status: AC
  Administered 2015-01-15 (×3): 10 meq via INTRAVENOUS

## 2015-01-15 MED ORDER — LIDOCAINE HCL (CARDIAC) 20 MG/ML IV SOLN
INTRAVENOUS | Status: AC
  Filled 2015-01-15: qty 5

## 2015-01-15 MED ORDER — ETOMIDATE 2 MG/ML IV SOLN
INTRAVENOUS | Status: DC | PRN
  Administered 2015-01-15: 6 mg via INTRAVENOUS
  Administered 2015-01-15: 10 mg via INTRAVENOUS
  Administered 2015-01-15: 4 mg via INTRAVENOUS

## 2015-01-15 MED ORDER — MORPHINE SULFATE (PF) 2 MG/ML IV SOLN
2.0000 mg | INTRAVENOUS | Status: DC | PRN
  Administered 2015-01-15: 4 mg via INTRAVENOUS
  Administered 2015-01-15: 2 mg via INTRAVENOUS
  Administered 2015-01-15: 4 mg via INTRAVENOUS
  Administered 2015-01-15 – 2015-01-16 (×2): 2 mg via INTRAVENOUS
  Filled 2015-01-15: qty 2
  Filled 2015-01-15 (×2): qty 1
  Filled 2015-01-15: qty 2
  Filled 2015-01-15: qty 1

## 2015-01-15 MED ORDER — ONDANSETRON HCL 4 MG/2ML IJ SOLN: 4.0000 mg | Freq: Four times a day (QID) | INTRAMUSCULAR | Status: DC | PRN

## 2015-01-15 MED ORDER — ACETAMINOPHEN 500 MG PO TABS
1000.0000 mg | ORAL_TABLET | Freq: Four times a day (QID) | ORAL | Status: AC
  Administered 2015-01-16 – 2015-01-20 (×19): 1000 mg via ORAL
  Filled 2015-01-15 (×21): qty 2

## 2015-01-15 MED ORDER — MIDAZOLAM HCL 10 MG/2ML IJ SOLN
INTRAMUSCULAR | Status: AC
  Filled 2015-01-15: qty 4

## 2015-01-15 MED ORDER — ROCURONIUM BROMIDE 100 MG/10ML IV SOLN
INTRAVENOUS | Status: DC | PRN
  Administered 2015-01-15: 30 mg via INTRAVENOUS
  Administered 2015-01-15: 50 mg via INTRAVENOUS
  Administered 2015-01-15: 100 mg via INTRAVENOUS
  Administered 2015-01-15: 20 mg via INTRAVENOUS

## 2015-01-15 MED ORDER — LACTATED RINGERS IV SOLN: INTRAVENOUS | Status: DC

## 2015-01-15 MED ORDER — MIDAZOLAM HCL 5 MG/5ML IJ SOLN
INTRAMUSCULAR | Status: DC | PRN
  Administered 2015-01-15: 1 mg via INTRAVENOUS
  Administered 2015-01-15 (×2): 2 mg via INTRAVENOUS
  Administered 2015-01-15: 1 mg via INTRAVENOUS
  Administered 2015-01-15 (×2): 2 mg via INTRAVENOUS

## 2015-01-15 MED ORDER — NITROGLYCERIN IN D5W 200-5 MCG/ML-% IV SOLN: 0.0000 ug/min | INTRAVENOUS | Status: DC

## 2015-01-15 MED ORDER — SUCCINYLCHOLINE CHLORIDE 20 MG/ML IJ SOLN
INTRAMUSCULAR | Status: AC
  Filled 2015-01-15: qty 1

## 2015-01-15 MED ORDER — HEPARIN SODIUM (PORCINE) 1000 UNIT/ML IJ SOLN
INTRAMUSCULAR | Status: DC | PRN
  Administered 2015-01-15: 20000 [IU] via INTRAVENOUS

## 2015-01-15 MED ORDER — LACTATED RINGERS IV SOLN
INTRAVENOUS | Status: DC | PRN
  Administered 2015-01-15: 08:00:00 via INTRAVENOUS

## 2015-01-15 MED ORDER — 0.9 % SODIUM CHLORIDE (POUR BTL) OPTIME
TOPICAL | Status: DC | PRN
  Administered 2015-01-15: 6000 mL

## 2015-01-15 MED ORDER — PHENYLEPHRINE HCL 10 MG/ML IJ SOLN
0.0000 ug/min | INTRAVENOUS | Status: DC
  Filled 2015-01-15: qty 2

## 2015-01-15 MED ORDER — VANCOMYCIN HCL IN DEXTROSE 1-5 GM/200ML-% IV SOLN
1000.0000 mg | Freq: Once | INTRAVENOUS | Status: AC
  Administered 2015-01-15: 1000 mg via INTRAVENOUS
  Filled 2015-01-15: qty 200

## 2015-01-15 MED ORDER — FENTANYL CITRATE (PF) 100 MCG/2ML IJ SOLN
INTRAMUSCULAR | Status: DC | PRN
  Administered 2015-01-15 (×2): 100 ug via INTRAVENOUS
  Administered 2015-01-15: 650 ug via INTRAVENOUS
  Administered 2015-01-15: 50 ug via INTRAVENOUS
  Administered 2015-01-15: 100 ug via INTRAVENOUS
  Administered 2015-01-15: 50 ug via INTRAVENOUS

## 2015-01-15 MED ORDER — BUPROPION HCL ER (XL) 300 MG PO TB24
300.0000 mg | ORAL_TABLET | Freq: Every day | ORAL | Status: DC
  Administered 2015-01-16 – 2015-01-21 (×6): 300 mg via ORAL
  Filled 2015-01-15 (×6): qty 1

## 2015-01-15 MED ORDER — THROMBIN 20000 UNITS EX SOLR
CUTANEOUS | Status: DC | PRN
  Administered 2015-01-15: 20000 [IU] via TOPICAL

## 2015-01-15 MED ORDER — ROCURONIUM BROMIDE 50 MG/5ML IV SOLN
INTRAVENOUS | Status: AC
  Filled 2015-01-15: qty 1

## 2015-01-15 MED ORDER — PROPOFOL 10 MG/ML IV BOLUS
INTRAVENOUS | Status: DC | PRN
  Administered 2015-01-15: 50 mg via INTRAVENOUS

## 2015-01-15 MED ORDER — INSULIN ASPART 100 UNIT/ML ~~LOC~~ SOLN
0.0000 [IU] | SUBCUTANEOUS | Status: DC
  Administered 2015-01-16 (×2): 2 [IU] via SUBCUTANEOUS

## 2015-01-15 MED ORDER — SODIUM CHLORIDE 0.9 % IV SOLN: Freq: Once | INTRAVENOUS | Status: DC

## 2015-01-15 MED ORDER — SODIUM CHLORIDE 0.9 % IJ SOLN
3.0000 mL | Freq: Two times a day (BID) | INTRAMUSCULAR | Status: DC
  Administered 2015-01-16 – 2015-01-18 (×5): 3 mL via INTRAVENOUS

## 2015-01-15 MED ORDER — ASPIRIN EC 325 MG PO TBEC
325.0000 mg | DELAYED_RELEASE_TABLET | Freq: Every day | ORAL | Status: DC
  Filled 2015-01-15: qty 1

## 2015-01-15 MED ORDER — PHENYLEPHRINE HCL 10 MG/ML IJ SOLN
10.0000 mg | INTRAVENOUS | Status: DC | PRN
  Administered 2015-01-15: 10 ug/min via INTRAVENOUS

## 2015-01-15 MED ORDER — HEMOSTATIC AGENTS (NO CHARGE) OPTIME
TOPICAL | Status: DC | PRN
  Administered 2015-01-15 (×3): 1 via TOPICAL

## 2015-01-15 MED ORDER — LACTATED RINGERS IV SOLN
INTRAVENOUS | Status: DC | PRN
  Administered 2015-01-15: 07:00:00 via INTRAVENOUS

## 2015-01-15 MED ORDER — FAMOTIDINE IN NACL 20-0.9 MG/50ML-% IV SOLN
20.0000 mg | Freq: Two times a day (BID) | INTRAVENOUS | Status: DC
  Administered 2015-01-15: 20 mg via INTRAVENOUS

## 2015-01-15 MED ORDER — METHYLPREDNISOLONE SODIUM SUCC 125 MG IJ SOLR
INTRAMUSCULAR | Status: AC
  Filled 2015-01-15: qty 2

## 2015-01-15 MED ORDER — TRAMADOL HCL 50 MG PO TABS
50.0000 mg | ORAL_TABLET | ORAL | Status: DC | PRN
  Administered 2015-01-16 – 2015-01-21 (×13): 100 mg via ORAL
  Filled 2015-01-15 (×13): qty 2

## 2015-01-15 MED ORDER — DEXMEDETOMIDINE HCL IN NACL 200 MCG/50ML IV SOLN: 0.0000 ug/kg/h | INTRAVENOUS | Status: DC

## 2015-01-15 MED ORDER — INSULIN REGULAR BOLUS VIA INFUSION
0.0000 [IU] | Freq: Three times a day (TID) | INTRAVENOUS | Status: DC
  Filled 2015-01-15: qty 10

## 2015-01-15 MED ORDER — OXYCODONE HCL 5 MG PO TABS
5.0000 mg | ORAL_TABLET | ORAL | Status: DC | PRN
  Administered 2015-01-15 – 2015-01-20 (×13): 10 mg via ORAL
  Filled 2015-01-15 (×14): qty 2

## 2015-01-15 MED ORDER — SODIUM CHLORIDE 0.9 % IJ SOLN: 3.0000 mL | INTRAMUSCULAR | Status: DC | PRN

## 2015-01-15 MED ORDER — ATORVASTATIN CALCIUM 10 MG PO TABS
10.0000 mg | ORAL_TABLET | Freq: Every day | ORAL | Status: DC
  Administered 2015-01-16 – 2015-01-20 (×5): 10 mg via ORAL
  Filled 2015-01-15 (×6): qty 1

## 2015-01-15 MED ORDER — SODIUM CHLORIDE 0.9 % IV SOLN: 250.0000 mL | INTRAVENOUS | Status: DC

## 2015-01-15 MED ORDER — METOPROLOL TARTRATE 1 MG/ML IV SOLN: 2.5000 mg | INTRAVENOUS | Status: DC | PRN

## 2015-01-15 MED ORDER — PROPOFOL 10 MG/ML IV BOLUS
INTRAVENOUS | Status: AC
  Filled 2015-01-15: qty 20

## 2015-01-15 MED ORDER — ASPIRIN 81 MG PO CHEW: 324.0000 mg | CHEWABLE_TABLET | Freq: Every day | ORAL | Status: DC

## 2015-01-15 MED ORDER — METOPROLOL TARTRATE 25 MG/10 ML ORAL SUSPENSION
12.5000 mg | Freq: Two times a day (BID) | ORAL | Status: DC
  Filled 2015-01-15 (×2): qty 5

## 2015-01-15 MED ORDER — METOPROLOL TARTRATE 12.5 MG HALF TABLET
12.5000 mg | ORAL_TABLET | Freq: Two times a day (BID) | ORAL | Status: DC
Start: 2015-01-15 — End: 2015-01-16
  Filled 2015-01-15 (×2): qty 1

## 2015-01-15 MED ORDER — ALBUMIN HUMAN 5 % IV SOLN
250.0000 mL | INTRAVENOUS | Status: AC | PRN
  Administered 2015-01-15 (×4): 250 mL via INTRAVENOUS
  Filled 2015-01-15 (×2): qty 250

## 2015-01-15 MED ORDER — METHYLPREDNISOLONE SODIUM SUCC 125 MG IJ SOLR
INTRAMUSCULAR | Status: DC | PRN
  Administered 2015-01-15: 125 mg via INTRAVENOUS

## 2015-01-15 MED ORDER — SODIUM CHLORIDE 0.9 % IV SOLN
INTRAVENOUS | Status: DC
  Administered 2015-01-15: 1.9 [IU]/h via INTRAVENOUS
  Filled 2015-01-15 (×2): qty 2.5

## 2015-01-15 MED ORDER — BISACODYL 10 MG RE SUPP
10.0000 mg | Freq: Every day | RECTAL | Status: DC
  Filled 2015-01-15: qty 1

## 2015-01-15 MED ORDER — MIDAZOLAM HCL 2 MG/2ML IJ SOLN: 2.0000 mg | INTRAMUSCULAR | Status: DC | PRN

## 2015-01-15 MED ORDER — SODIUM CHLORIDE 0.45 % IV SOLN
INTRAVENOUS | Status: DC | PRN
  Administered 2015-01-15: 15:00:00 via INTRAVENOUS

## 2015-01-15 MED ORDER — ACETAMINOPHEN 650 MG RE SUPP
650.0000 mg | Freq: Once | RECTAL | Status: AC
  Administered 2015-01-15: 650 mg via RECTAL

## 2015-01-15 MED ORDER — ACETAMINOPHEN 160 MG/5ML PO SOLN: 1000.0000 mg | Freq: Four times a day (QID) | ORAL | Status: AC

## 2015-01-15 MED ORDER — PANTOPRAZOLE SODIUM 40 MG PO TBEC
40.0000 mg | DELAYED_RELEASE_TABLET | Freq: Every day | ORAL | Status: DC
  Administered 2015-01-17 – 2015-01-21 (×5): 40 mg via ORAL
  Filled 2015-01-15 (×5): qty 1

## 2015-01-15 MED ORDER — ROCURONIUM BROMIDE 50 MG/5ML IV SOLN
INTRAVENOUS | Status: AC
  Filled 2015-01-15: qty 3

## 2015-01-15 MED ORDER — MORPHINE SULFATE (PF) 2 MG/ML IV SOLN: 1.0000 mg | INTRAVENOUS | Status: DC | PRN

## 2015-01-15 MED ORDER — CHLORHEXIDINE GLUCONATE 0.12 % MT SOLN
15.0000 mL | OROMUCOSAL | Status: AC
  Administered 2015-01-15: 15 mL via OROMUCOSAL

## 2015-01-15 MED ORDER — DEXTROSE 5 % IV SOLN
1.5000 g | Freq: Two times a day (BID) | INTRAVENOUS | Status: AC
  Administered 2015-01-15 – 2015-01-17 (×4): 1.5 g via INTRAVENOUS
  Filled 2015-01-15 (×4): qty 1.5

## 2015-01-15 MED ORDER — ETOMIDATE 2 MG/ML IV SOLN
INTRAVENOUS | Status: AC
  Filled 2015-01-15: qty 10

## 2015-01-15 MED ORDER — METHYLPREDNISOLONE SODIUM SUCC 125 MG IJ SOLR: INTRAMUSCULAR | Status: DC | PRN

## 2015-01-15 MED ORDER — MAGNESIUM SULFATE 4 GM/100ML IV SOLN
4.0000 g | Freq: Once | INTRAVENOUS | Status: AC
  Administered 2015-01-15: 4 g via INTRAVENOUS
  Filled 2015-01-15: qty 100

## 2015-01-15 MED ORDER — POTASSIUM CHLORIDE 10 MEQ/50ML IV SOLN
10.0000 meq | INTRAVENOUS | Status: AC
  Administered 2015-01-15 – 2015-01-16 (×3): 10 meq via INTRAVENOUS

## 2015-01-15 MED ORDER — DOCUSATE SODIUM 100 MG PO CAPS
200.0000 mg | ORAL_CAPSULE | Freq: Every day | ORAL | Status: DC
  Administered 2015-01-16 – 2015-01-21 (×6): 200 mg via ORAL
  Filled 2015-01-15 (×6): qty 2

## 2015-01-15 MED ORDER — SODIUM CHLORIDE 0.9 % IV SOLN
INTRAVENOUS | Status: DC
  Administered 2015-01-15: 15:00:00 via INTRAVENOUS

## 2015-01-15 MED ORDER — BISACODYL 5 MG PO TBEC
10.0000 mg | DELAYED_RELEASE_TABLET | Freq: Every day | ORAL | Status: DC
  Administered 2015-01-16 – 2015-01-21 (×4): 10 mg via ORAL
  Filled 2015-01-15 (×4): qty 2

## 2015-01-15 MED ORDER — DEXMEDETOMIDINE HCL IN NACL 400 MCG/100ML IV SOLN
0.1000 ug/kg/h | INTRAVENOUS | Status: DC
  Filled 2015-01-15: qty 100

## 2015-01-15 MED ORDER — SODIUM CHLORIDE 0.9 % IJ SOLN
INTRAMUSCULAR | Status: AC
  Filled 2015-01-15: qty 20

## 2015-01-15 MED ORDER — PROTAMINE SULFATE 10 MG/ML IV SOLN
INTRAVENOUS | Status: DC | PRN
  Administered 2015-01-15: 200 mg via INTRAVENOUS

## 2015-01-15 MED ORDER — THROMBIN 20000 UNITS EX SOLR
OROMUCOSAL | Status: DC | PRN
  Administered 2015-01-15 (×3): 4 mL via TOPICAL

## 2015-01-15 MED FILL — Magnesium Sulfate Inj 50%: INTRAMUSCULAR | Qty: 10 | Status: AC

## 2015-01-15 MED FILL — Sodium Chloride IV Soln 0.9%: INTRAVENOUS | Qty: 3000 | Status: AC

## 2015-01-15 MED FILL — Heparin Sodium (Porcine) Inj 1000 Unit/ML: INTRAMUSCULAR | Qty: 10 | Status: AC

## 2015-01-15 MED FILL — Heparin Sodium (Porcine) Inj 1000 Unit/ML: INTRAMUSCULAR | Qty: 30 | Status: AC

## 2015-01-15 MED FILL — Electrolyte-R (PH 7.4) Solution: INTRAVENOUS | Qty: 6000 | Status: AC

## 2015-01-15 MED FILL — Lidocaine HCl IV Inj 20 MG/ML: INTRAVENOUS | Qty: 5 | Status: AC

## 2015-01-15 MED FILL — Sodium Bicarbonate IV Soln 8.4%: INTRAVENOUS | Qty: 150 | Status: AC

## 2015-01-15 MED FILL — Potassium Chloride Inj 2 mEq/ML: INTRAVENOUS | Qty: 40 | Status: AC

## 2015-01-15 SURGICAL SUPPLY — 102 items
ADAPTER CARDIO PERF ANTE/RETRO (ADAPTER) ×4 IMPLANT
APPLICATOR TIP COSEAL (VASCULAR PRODUCTS) ×12 IMPLANT
ATTRACTOMAT 16X20 MAGNETIC DRP (DRAPES) ×4 IMPLANT
BAG DECANTER FOR FLEXI CONT (MISCELLANEOUS) ×4 IMPLANT
BLADE STERNUM SYSTEM 6 (BLADE) ×4 IMPLANT
BLADE SURG 15 STRL LF DISP TIS (BLADE) ×2 IMPLANT
BLADE SURG 15 STRL SS (BLADE) ×2
CANISTER SUCTION 2500CC (MISCELLANEOUS) ×4 IMPLANT
CANNULA GUNDRY RCSP 15FR (MISCELLANEOUS) ×12 IMPLANT
CATH HEART VENT LEFT (CATHETERS) ×2 IMPLANT
CATH ROBINSON RED A/P 18FR (CATHETERS) ×16 IMPLANT
CATH THORACIC 36FR (CATHETERS) ×4 IMPLANT
CATH THORACIC 36FR RT ANG (CATHETERS) ×4 IMPLANT
CAUTERY HIGH TEMP VAS (MISCELLANEOUS) ×4 IMPLANT
CONT SPEC 4OZ CLIKSEAL STRL BL (MISCELLANEOUS) ×8 IMPLANT
CONT SPEC STER OR (MISCELLANEOUS) ×4 IMPLANT
COVER SURGICAL LIGHT HANDLE (MISCELLANEOUS) ×8 IMPLANT
CRADLE DONUT ADULT HEAD (MISCELLANEOUS) ×4 IMPLANT
DRAPE SLUSH/WARMER DISC (DRAPES) IMPLANT
DRSG COVADERM 4X14 (GAUZE/BANDAGES/DRESSINGS) ×4 IMPLANT
ELECT CAUTERY BLADE 6.4 (BLADE) ×4 IMPLANT
ELECT REM PT RETURN 9FT ADLT (ELECTROSURGICAL) ×8
ELECTRODE REM PT RTRN 9FT ADLT (ELECTROSURGICAL) ×4 IMPLANT
GAUZE SPONGE 4X4 12PLY STRL (GAUZE/BANDAGES/DRESSINGS) ×4 IMPLANT
GLOVE BIO SURGEON STRL SZ 6 (GLOVE) IMPLANT
GLOVE BIO SURGEON STRL SZ 6.5 (GLOVE) ×12 IMPLANT
GLOVE BIO SURGEON STRL SZ7 (GLOVE) ×16 IMPLANT
GLOVE BIO SURGEON STRL SZ7.5 (GLOVE) ×4 IMPLANT
GLOVE BIO SURGEONS STRL SZ 6.5 (GLOVE) ×4
GLOVE EUDERMIC 7 POWDERFREE (GLOVE) ×8 IMPLANT
GOWN STRL REUS W/ TWL LRG LVL3 (GOWN DISPOSABLE) ×8 IMPLANT
GOWN STRL REUS W/ TWL XL LVL3 (GOWN DISPOSABLE) ×2 IMPLANT
GOWN STRL REUS W/TWL LRG LVL3 (GOWN DISPOSABLE) ×8
GOWN STRL REUS W/TWL XL LVL3 (GOWN DISPOSABLE) ×2
GRAFT HEMASHIELD 28X40 (Vascular Products) ×4 IMPLANT
HEART VENT LT CURVED (MISCELLANEOUS) ×4 IMPLANT
HEMOSTAT POWDER SURGIFOAM 1G (HEMOSTASIS) ×12 IMPLANT
HEMOSTAT SURGICEL 2X14 (HEMOSTASIS) ×4 IMPLANT
KIT BASIN OR (CUSTOM PROCEDURE TRAY) ×4 IMPLANT
KIT CATH CPB BARTLE (MISCELLANEOUS) ×4 IMPLANT
KIT ROOM TURNOVER OR (KITS) ×4 IMPLANT
KIT SUCTION CATH 14FR (SUCTIONS) ×4 IMPLANT
LINE VENT (MISCELLANEOUS) ×4 IMPLANT
LOOP VESSEL SUPERMAXI WHITE (MISCELLANEOUS) ×4 IMPLANT
NDL SUT 1 .5 CRC FRENCH EYE (NEEDLE) ×2 IMPLANT
NEEDLE FRENCH EYE (NEEDLE) ×2
NS IRRIG 1000ML POUR BTL (IV SOLUTION) ×24 IMPLANT
PACK OPEN HEART (CUSTOM PROCEDURE TRAY) ×4 IMPLANT
PAD ARMBOARD 7.5X6 YLW CONV (MISCELLANEOUS) ×8 IMPLANT
SEALANT SURG COSEAL 4ML (VASCULAR PRODUCTS) ×4 IMPLANT
SEALANT SURG COSEAL 8ML (VASCULAR PRODUCTS) ×8 IMPLANT
SET CARDIOPLEGIA MPS 5001102 (MISCELLANEOUS) ×4 IMPLANT
SET VEIN GRAFT PERF (SET/KITS/TRAYS/PACK) ×4 IMPLANT
SPONGE GAUZE 4X4 12PLY STER LF (GAUZE/BANDAGES/DRESSINGS) ×4 IMPLANT
SUT BONE WAX W31G (SUTURE) ×4 IMPLANT
SUT ETHIBON 2 0 V 52N 30 (SUTURE) ×16 IMPLANT
SUT ETHIBON EXCEL 2-0 V-5 (SUTURE) IMPLANT
SUT ETHIBOND 2 0 SH (SUTURE) ×8
SUT ETHIBOND 2 0 SH 36X2 (SUTURE) ×8 IMPLANT
SUT ETHIBOND V-5 VALVE (SUTURE) IMPLANT
SUT PROLENE 3 0 SH 1 (SUTURE) ×4 IMPLANT
SUT PROLENE 3 0 SH DA (SUTURE) IMPLANT
SUT PROLENE 3 0 SH1 36 (SUTURE) ×12 IMPLANT
SUT PROLENE 4 0 RB 1 (SUTURE) ×22
SUT PROLENE 4-0 RB1 .5 CRCL 36 (SUTURE) ×22 IMPLANT
SUT PROLENE 5 0 C 1 24 (SUTURE) IMPLANT
SUT PROLENE 5 0 C 1 36 (SUTURE) ×16 IMPLANT
SUT PROLENE 5 0 RB 2 (SUTURE) ×8 IMPLANT
SUT PROLENE 6 0 C 1 30 (SUTURE) ×8 IMPLANT
SUT SILK  1 MH (SUTURE) ×6
SUT SILK 1 MH (SUTURE) ×6 IMPLANT
SUT SILK 1 TIES 10X30 (SUTURE) ×4 IMPLANT
SUT SILK 2 0 SH CR/8 (SUTURE) ×12 IMPLANT
SUT SILK 2 0 TIES 10X30 (SUTURE) ×4 IMPLANT
SUT SILK 2 0 TIES 17X18 (SUTURE) ×2
SUT SILK 2-0 18XBRD TIE BLK (SUTURE) ×2 IMPLANT
SUT SILK 3 0 SH CR/8 (SUTURE) ×4 IMPLANT
SUT SILK 4 0 TIE 10X30 (SUTURE) ×8 IMPLANT
SUT STEEL 6MS V (SUTURE) IMPLANT
SUT STEEL STERNAL CCS#1 18IN (SUTURE) IMPLANT
SUT STEEL SZ 6 DBL 3X14 BALL (SUTURE) IMPLANT
SUT VIC AB 1 CTX 36 (SUTURE) ×6
SUT VIC AB 1 CTX36XBRD ANBCTR (SUTURE) ×6 IMPLANT
SUT VIC AB 2-0 CT1 27 (SUTURE)
SUT VIC AB 2-0 CT1 TAPERPNT 27 (SUTURE) IMPLANT
SUT VIC AB 2-0 CTX 27 (SUTURE) ×8 IMPLANT
SUT VIC AB 3-0 X1 27 (SUTURE) ×8 IMPLANT
SUTURE E-PAK OPEN HEART (SUTURE) ×4 IMPLANT
SYSTEM SAHARA CHEST DRAIN ATS (WOUND CARE) ×4 IMPLANT
TAPE CLOTH SURG 4X10 WHT LF (GAUZE/BANDAGES/DRESSINGS) ×4 IMPLANT
TAPE PAPER 2X10 WHT MICROPORE (GAUZE/BANDAGES/DRESSINGS) ×4 IMPLANT
TOWEL OR 17X24 6PK STRL BLUE (TOWEL DISPOSABLE) ×4 IMPLANT
TOWEL OR 17X26 10 PK STRL BLUE (TOWEL DISPOSABLE) ×4 IMPLANT
TRAY FOLEY IC TEMP SENS 14FR (CATHETERS) ×4 IMPLANT
TUBE CONNECTING 12'X1/4 (SUCTIONS) ×2
TUBE CONNECTING 12X1/4 (SUCTIONS) ×6 IMPLANT
UNDERPAD 30X30 INCONTINENT (UNDERPADS AND DIAPERS) ×4 IMPLANT
VALVE AOR 12X21MECH LO POR (Prosthesis & Implant Heart) ×2 IMPLANT
VALVE AORTIC COND (Prosthesis & Implant Heart) ×2 IMPLANT
VENT LEFT HEART 12002 (CATHETERS) ×4
WATER STERILE IRR 1000ML POUR (IV SOLUTION) ×8 IMPLANT
YANKAUER SUCT BULB TIP NO VENT (SUCTIONS) ×4 IMPLANT

## 2015-01-15 NOTE — Brief Op Note (Signed)
01/15/2015  1:20 PM      [place]       , [address]             [phone]     01/15/2015  1:20 PM  PATIENT:  [name]  44 y.o. female  PRE-OPERATIVE DIAGNOSIS:  TAA, SEVERE AI  POST-OPERATIVE DIAGNOSIS:  TAA, SEVERE AI  PROCEDURE:  Procedure(s): BENTALL PROCEDURE TRANSESOPHAGEAL ECHOCARDIOGRAM (TEE)  SURGEON:  Surgeon(s): Gaye Pollack, MD  PHYSICIAN ASSISTANT: Shail Urbas PA-C  ANESTHESIA:   general  PATIENT CONDITION:  ICU - intubated and hemodynamically stable.  PRE-OPERATIVE WEIGHT: 93kg  Aortic Valve  Procedure Performed:  Replacement: Yes.  Mechanical Valve. Implant Model Number:[id number], Size:21, Unique Device Identifier:[id number]  Repair/Reconstruction: No.   Aortic Annular Enlargement: Yes.   Aortic Valve Etiology   Aortic Insufficiency:  Severe  Aortic Valve Disease:  No.  Aortic Stenosis:  No.  Etiology (Choose at least one and up to  5 etiologies):  LV outflow tract pathology, Sub-aortic membrane and Primary Aortic Disease, Atherosclerotic Aneurysm

## 2015-01-15 NOTE — Anesthesia Procedure Notes (Addendum)
Procedure Name: Intubation Date/Time: 01/15/2015 8:12 AM Performed by: Rogers Blocker Pre-anesthesia Checklist: Patient identified, Emergency Drugs available, Suction available, Patient being monitored and Timeout performed Patient Re-evaluated:Patient Re-evaluated prior to inductionOxygen Delivery Method: Circle system utilized Preoxygenation: Pre-oxygenation with 100% oxygen Intubation Type: IV induction Laryngoscope Size: Mac and 3 Grade View: Grade III Tube size: 7.5 mm Number of attempts: 1 Airway Equipment and Method: Bougie stylet Placement Confirmation: positive ETCO2 and breath sounds checked- equal and bilateral Secured at: 21 cm Dental Injury: Teeth and Oropharynx as per pre-operative assessment

## 2015-01-15 NOTE — Anesthesia Preprocedure Evaluation (Addendum)
Anesthesia Evaluation  Patient identified by MRN, date of birth, ID band Patient awake    Reviewed: Allergy & Precautions, H&P , NPO status , Patient's Chart, lab work & pertinent test results  History of Anesthesia Complications (+) PONV and MALIGNANT HYPERTHERMIA  Airway Mallampati: II  TM Distance: >3 FB Neck ROM: Full    Dental no notable dental hx. 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 Hematology negative hematology ROS (+)   Anesthesia Other Findings   Reproductive/Obstetrics negative OB ROS                           Anesthesia Physical Anesthesia Plan  ASA: IV  Anesthesia Plan: General   Post-op Pain Management:    Induction: Intravenous  Airway Management Planned: Oral ETT  Additional Equipment: Arterial line, CVP, PA Cath, TEE and Ultrasound Guidance Line Placement  Intra-op Plan:   Post-operative Plan: Post-operative intubation/ventilation  Informed Consent: I have reviewed the patients History and Physical, chart, labs and discussed the procedure including the risks, benefits and alternatives for the proposed anesthesia with the patient or authorized representative who has indicated his/her understanding and acceptance.   Dental advisory given  Plan Discussed with: CRNA  Anesthesia Plan Comments:         Anesthesia Quick Evaluation

## 2015-01-15 NOTE — Plan of Care (Signed)
Problem: Phase II - Intermediate Post-Op Goal: Wean to Extubate Outcome: Completed/Met Date Met:  01/15/15 Extubated 1807

## 2015-01-15 NOTE — OR Nursing (Signed)
13:50 - 45 minute call to SICU, 14:25 - 20 minute call to SICU charge nurse

## 2015-01-15 NOTE — Op Note (Signed)
CARDIOVASCULAR SURGERY OPERATIVE NOTE  01/15/2015  Surgeon:  Gaye Pollack, MD  First Assistant: Jadene Pierini,  PA-C   Preoperative Diagnosis:  Severe aortic insufficiency with aortic root and ascending aortic aneurysm   Postoperative Diagnosis:  Same   Procedure:  1. Redo Median Sternotomy 2. Extracorporeal circulation 3.   Replacement of ascending aortic aneurysm using a 28 mm Hemashield graft under deep hypothermic circulatory arrest. 4.   Bentall procedure using a 21 mm St. Jude Masters Valved Graft with reimplantation of the coronary arteries.  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 Jacquelin Hawking at [facility] in 2001. I do not have the operative note yet. She has developed dyspnea on exertion over the past year with fatigue and has gained about 50 lbs over that period due to less physical activity. An echo at [place] on 05/11/2012 showed a normal functioning aortic valve with mild aortic root dilation. A 2D echo on 09/10/2014 did not visualize the aortic valve very well but there was moderate to severe AI directed eccentrically in the LVOT along the septum. The LVEF was 55-60%. There was a small subaortic membrane that does not cause significant flow obstruction and is probably the remaining rim from the prior subaortic stenosis. LV dimensions were normal. A TEE on 10/24/2014 showed a trileaflet aortic valve with mild focal thickening of the left and noncoronary cusps with severe eccentric regurgitation along the LVOT and septum. Cardiac cath shows normal coronary arteries and an EF of 50-55% with normal left ventricular and PA pressures.  She has a trileaflet aortic valve with severe eccentric AI s/p surgical repair of a sub aortic membrane stenosis at [facility] in 2001. She had a normal aortic valve with no AI by echo in 04/2012. She has normal coronary arteries. The  CTA shows that the ascending aorta is enlarged to 4.2 cm which is almost twice the size of her descending aorta. I think she will require Bentall procedure to replace the aortic valve and enlarged ascending aorta. Given her age of 40 I think a mechanical valved graft would be best. She has no contraindication to anticoagulation and is a compliant patient. I discussed the risk of pregnancy while on coumadin and that it is strictly contraindicated. She says that she does not plan to get pregnant. 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 I discussed the pros and cons of mechanical and tissue valves and she is in full agreement with a mechanical valve. I discussed the operative procedure with the patient and family including alternatives, benefits and risks; including but not limited to bleeding, blood transfusion, infection, stroke, myocardial infarction, graft failure, heart block requiring a permanent pacemaker, organ dysfunction, and death. [name] understands and agrees to proceed.  Preparation:  The patient was seen in the preoperative holding area and the correct patient, correct operation were confirmed with the patient after reviewing the medical record and catheterization. The consent was signed by me. Preoperative antibiotics were given. A pulmonary arterial line and radial arterial line were placed by the anesthesia team. The patient was taken back to the operating room and positioned supine on the operating room table. After being placed under general endotracheal anesthesia by the anesthesia team a foley catheter was placed. The neck, chest, abdomen, and both legs were  prepped with betadine soap and solution and draped in the usual sterile manner. A surgical time-out was taken and the correct patient and operative procedure were confirmed with the nursing and anesthesia staff.  TEE: performed by Dr. Suann Larry         *[place]*         *[facility]*            [address], [place] [address]              [phone]  ------------------------------------------------------------------- Intraoperative Transesophageal Echocardiography  Patient:  [name], [place] MR #:    [id number] Study Date: 01/15/2015 Gender:   F Age:    53 Height:   162.6 cm Weight:   93.6 kg BSA:    2.1 m^2 Pt. Status: Room:    2S04C  ADMITTING  Gilford Raid, MD ATTENDING  Gilford Raid, MD ORDERING   Gilford Raid, MD REFERRING  Gilford Raid, MD PERFORMING  Oren Bracket MD SONOGRAPHER Roseanna Rainbow  cc:  ------------------------------------------------------------------- LV EF: 55% -  65%  ------------------------------------------------------------------- Indications:   424.1 Aortic valve disorders.  ------------------------------------------------------------------- History:  PMH: Breast cancer. Subaortic stenosis. Palpitations. Severe AI. Risk factors: Dyslipidemia.  ------------------------------------------------------------------- Study Conclusions  - Left ventricle: Systolic function was normal. The estimated ejection fraction was in the range of 55% to 65%. Wall motion was normal; there were no regional wall motion abnormalities. The outflow tract showed moderate hypertrophy. - Aortic valve: There was severe regurgitation originating from the central coaptation point and directed along the septum. Diastolic flutter of the mitral valve. - Mitral valve: There was mild to moderate regurgitation, with multiple jets directed centrally. - Right atrium: No evidence of thrombus in the atrial cavity or appendage. - Atrial septum: No defect or patent foramen ovale was identified. Echo contrast study showed no right-to-left atrial level  shunt. - Tricuspid valve: There was mild-moderate regurgitation. - Pulmonic valve: No evidence of vegetation.  Intraoperative transesophageal echocardiography. Birthdate: Patient birthdate: 06/22/1970. Age: Patient is 44 yr old. Sex: Gender: female.  BMI: 35.4 kg/m^2. Blood pressure:   144/82 Patient status: Inpatient. Study date: Study date: 01/15/2015. Study time: 07:47 AM. Location: Operating room.  -------------------------------------------------------------------  ------------------------------------------------------------------- Left ventricle: Systolic function was normal. The estimated ejection fraction was in the range of 55% to 65%. Wall motion was normal; there were no regional wall motion abnormalities. The outflow tract showed moderate hypertrophy.  ------------------------------------------------------------------- Aortic valve:  Trileaflet; mildly thickened, noncalcified leaflets. Cusp separation was normal. Doppler: There was severe regurgitation originating from the central coaptation point and directed along the septum. Diastolic flutter of the mitral valve.  ------------------------------------------------------------------- Aorta: The aorta was not dilated.  ------------------------------------------------------------------- Mitral valve:  Normal thickness, noncalcified leaflets . Leaflet separation was normal. Doppler: There was mild to moderate regurgitation, with multiple jets directed centrally.  ------------------------------------------------------------------- Left atrium: The atrium was normal in size.  ------------------------------------------------------------------- Atrial septum: Well visualized. No defect or patent foramen ovale was identified. Echo contrast study showed no right-to-left atrial level shunt.  ------------------------------------------------------------------- Right ventricle: The cavity size was  normal. Wall thickness was normal. Systolic function was normal.  ------------------------------------------------------------------- Pulmonic valve:  Structurally normal valve.  Cusp separation was normal. No evidence of vegetation.  ------------------------------------------------------------------- Tricuspid valve:  Doppler: There was mild-moderate regurgitation.  ------------------------------------------------------------------- Right atrium: The atrium was normal in size. No evidence of thrombus in the atrial cavity or appendage.  ------------------------------------------------------------------- Pericardium: The  pericardium was normal in appearance. There was no pericardial effusion.  ------------------------------------------------------------------- Post bypass:  - The estimated LV ejection fraction was 55%. - There is septal hypokinesis present post bypass.  - Aortic valve: No regurgitation. - Mitral valve: Regurgitation; unchanged from the prior stage. - Tricuspid valve: Regurgitation; unchanged from the prior stage.  Pre bypass: Post bypass:  ------------------------------------------------------------------- Post procedure conclusions Ascending Aorta:  - The aorta was not dilated.  ------------------------------------------------------------------- Measurements  LVOT     Value LVOT ID, S  23  mm LVOT area   4.15 cm^2  Legend: (L) and (H) mark values outside specified reference range.  ------------------------------------------------------------------- Prepared and Electronically Authenticated by  Roderic Palau 2016-11-03T18:31:20   Cardiopulmonary Bypass:  A median sternotomy was performed using the oscillating saw without difficulty.  The anterior surface of the heart was separated from the sternum using cautery. Right ventricular function appeared normal. The ascending aorta was enlarged and had no palpable  plaque. There were no contraindications to aortic cannulation. The patient was fully systemically heparinized and the ACT was maintained > 400 sec. The distal ascending aorta was cannulated with a 20 F aortic cannula for arterial inflow. Venous cannulation was performed via the right atrial appendage using a two-staged venous cannula.     Resection and grafting of ascending aortic aneurysm:  The patient was placed on cardiopulmonary bypass and a left ventricular vent was placed via the right superior pulmonary vein. Systemic cooling was begun with a goal temperature of 18 degrees centigrade by bladder and rectal temperature probes. A retrograde cardioplegia cannula was placed through the right atrium into the coronary sinus without difficulty. A retrograde cerebral perfusion cannula was placed into the SVC through a pursestring suture and the SVC was encircled with a silastic tape. After 30 minutes of cooling the target temperature of 18 degrees centigrade was reached. Cerebral oximetry was 70% bilaterally. BIS was zero. The patient was given Etomidate and 125 mg of Solumedrol. The head was packed in ice. The bed was placed in steep trendelenburg. Circulatory arrest was begun and the blood volume emptied into the venous reservoir. Continuous retrograde cerebraplegia was begun and the SVC occluded with the silastic tape. Cold blood retrograde cardioplegia was given and myocardial temperature dropped to 15 degrees centigrade. Additional doses were given at approximately 20 minute intervals throughout the period of circulatory arrest and cross-clamping. In addition cardioplegia was given directly into the coronary ostia. Complete diastolic arrest was maintained. The aortic cannula was removed. The aorta was transected just proximal to the innominate artery beveling the resection out along the undersurface of the aortic arch (Hemiarch replacement). The aortic diameter was measured at 28 mm here. A 28 x 10 mm  Hemasheild Platinum vascular graft was prepared. ( Catalog # [id number] P0, Lot # [id number], SN [id number]). It was anastomosed to the aortic arch in an end to end manner using 3-0 prolene continuous suture with a felt strip to reinforce the anastomisis. A light coating of CoSeal was applied to seal needle holes. The arterial end of the bypass circuit was then connected to the 49mm side arm graft and circulation was slowly resumed. The tape was removed from the SVC. The aortic graft was cross-clamped proximal to the side arm graft and full CPB support was resumed. Circulatory arrest time was 19 minutes. Retrograde cerebral perfusion time was 16 minutes.   Bentall Procedure:   The ascending aorta was mobilized from the right pulmonary artery and main PA. It was opened longitudinally and the valve  inspected. The aortic valve was trileaflet. All three leaflets were thickened with some ruptured fenestration at the commissure between the left and right cusp. There were some calcifications in the leaflets. The LVOT appeared thickened with some scar present from the prior myomectomy. There did not appear to be any obstruction. The right and left coronary arteries were removed from the aortic root with a button of aortic wall around the ostia. They were retracted carefully out of the way with stay sutures to prevent rotation. The native valve was excised taking care to remove all particulate debri. The annulus was sized and a 21 mm St. Jude Masters Engineer, manufacturing systems Graft was chosen. ( Ref # [id number], Serial # [id number]) A series of pledgetted 2-0 Ethibond horizontal mattress sutures were placed around the annulus with the pledgets in a sub-annular position. The sutures were placed through a strip of autologous pericardium to reinforce the annulus and then the valve sewing ring. The valve was lowered into place and the sutures at the hinge posts tied first followed by the remaining sutures. The valve seated  nicely. The discs moved normally. Small openings were made in the graft for the coronary anastomoses using a thermal cautery. Then the left and right coronary buttons were anastomosed to the graft in an end to side manner using continuous 5-0 prolene suture. A light coating of CoSeal was applied to each anastomosis for hemostasis. The two grafts were then cut to the appropriate length and anastomosed end to end using continuous 3-0 prolene suture. CoSeal was applied to seal the needle holes in the grafts. A vent cannula was placed into the graft to remove any air. Deairing maneuvers were performed and the bed placed in trendelenburg position.   Completion:  The patient was rewarmed to 37 degrees Centigrade. The crossclamp was removed with a time of 151 minutes. There was spontaneous return of junctional rhythm. The vascular anastomoses all appeared hemostatic. Two temporary epicardial pacing wires were placed on the right atrium and two on the right ventricle. The patient was weaned from CPB without difficulty on no inotropes. CPB time was 231 minutes. Cardiac output was 3.2 LPM. Dopamine was started at 2 mcg. TEE looked good as noted above with normal valve function, no AI, good LV function.  Heparin was fully reversed with protamine and the aortic and venous cannulas removed. Hemostasis was achieved. Mediastinal  drainage tubes were placed. The sternum was closed with double #6 stainless steel wires. The fascia was closed with continuous # 1 vicryl suture. The subcutaneous tissue was closed with 2-0 vicryl continuous suture. The skin was closed with 3-0 vicryl subcuticular suture. All sponge, needle, and instrument counts were reported correct at the end of the case. Dry sterile dressings were placed over the incisions and around the chest tubes which were connected to pleurevac suction. The patient was then transported to the surgical intensive care unit in critical but stable condition.

## 2015-01-15 NOTE — Progress Notes (Signed)
Patient ID: [name], female   DOB: 06/12/1970, 44 y.o.   MRN: [id number] EVENING ROUNDS NOTE :     [address]       [place] [address]             [phone]                 Day of Surgery Procedure(s) (LRB): BENTALL PROCEDURE (N/A) TRANSESOPHAGEAL ECHOCARDIOGRAM (TEE) (N/A)  Total Length of Stay:  LOS: 0 days  BP 95/63 mmHg  Pulse 90  Temp(Src) 97.5 F (36.4 C) (Core (Comment))  Resp 19  Wt 206 lb 12.8 oz (93.804 kg)  SpO2 100%  LMP 01/05/2015  .Intake/Output      11/03 0701 - 11/04 0700   I.V. (mL/kg) 1945.3 (20.7)   Blood 740   NG/GT 30   IV Piggyback 1350   Total Intake(mL/kg) 4065.3 (43.3)   Urine (mL/kg/hr) 4210 (3.5)   Emesis/NG output 300 (0.3)   Blood 1500 (1.3)   Chest Tube 100 (0.1)   Total Output 6110   Net -2044.7         . sodium chloride 20 mL/hr at 01/15/15 1900  . [START ON 01/16/2015] sodium chloride    . sodium chloride 20 mL/hr at 01/15/15 1522  . dexmedetomidine Stopped (01/15/15 1645)  . insulin (NOVOLIN-R) infusion 2.2 Units/hr (01/15/15 1900)  . lactated ringers 20 mL/hr at 01/15/15 1900  . lactated ringers Stopped (01/15/15 1800)  . nitroGLYCERIN    . phenylephrine (NEO-SYNEPHRINE) Adult infusion Stopped (01/15/15 1600)     Lab Results  Component Value Date   WBC 17.5* 01/15/2015   HGB 14.3 01/15/2015   HCT 42.0 01/15/2015   PLT 152 01/15/2015   GLUCOSE 131* 01/15/2015   CHOL 236* 10/23/2014   TRIG 128.0 10/23/2014   HDL 43.50 10/23/2014   LDLDIRECT 118.0 07/14/2014   LDLCALC 167* 10/23/2014   ALT 51 01/13/2015   AST 35 01/13/2015   NA 141 01/15/2015   K 3.8 01/15/2015   CL 101 01/15/2015   CREATININE 0.60 01/15/2015   BUN 13 01/15/2015   CO2 19* 01/13/2015   TSH 3.04 10/23/2014   INR 1.40 01/15/2015   HGBA1C 5.8* 01/13/2015   Awake just extubated, neuro intact, not bleeding  Grace Isaac MD  Beeper [phone] Office [phone] 01/15/2015 7:40 PM

## 2015-01-15 NOTE — Anesthesia Postprocedure Evaluation (Signed)
  Anesthesia Post-op Note  Patient: [name]  Procedure(s) Performed: Procedure(s) with comments: BENTALL PROCEDURE (N/A) - CIRC ARREST TRANSESOPHAGEAL ECHOCARDIOGRAM (TEE) (N/A)  Patient Location: PACU  Anesthesia Type:General  Level of Consciousness: awake and alert   Airway and Oxygen Therapy: Patient Spontanous Breathing  Post-op Pain: Controlled  Post-op Assessment: Post-op Vital signs reviewed, Patient's Cardiovascular Status Stable and Respiratory Function Stable  Post-op Vital Signs: Reviewed  Filed Vitals:   01/15/15 1600  BP: 114/87  Pulse: 90  Temp: 36.4 C  Resp: 12    Complications: No apparent anesthesia complications

## 2015-01-15 NOTE — Interval H&P Note (Signed)
History and Physical Interval Note:  01/15/2015 7:03 AM  [name]  has presented today for surgery, with the diagnosis of TAA  The various methods of treatment have been discussed with the patient and family. After consideration of risks, benefits and other options for treatment, the patient has consented to  Procedure(s) with comments: BENTALL PROCEDURE (N/A) - CIRC ARREST TRANSESOPHAGEAL ECHOCARDIOGRAM (TEE) (N/A) as a surgical intervention .  The patient's history has been reviewed, patient examined, no change in status, stable for surgery.  I have reviewed the patient's chart and labs.  Questions were answered to the patient's satisfaction.     Gaye Pollack

## 2015-01-15 NOTE — Progress Notes (Signed)
  Echocardiogram Echocardiogram Transesophageal has been performed.  [name] 01/15/2015, 9:21 AM

## 2015-01-15 NOTE — Procedures (Signed)
Extubation Procedure Note  Patient Details:   Name: [name] DOB: 12/18/1970 MRN: [id number]   Airway Documentation:     Evaluation  O2 sats: stable throughout Complications: No apparent complications Patient did tolerate procedure well. Bilateral Breath Sounds: Clear   Yes   Pt. Was extubated to a 3L [place] without any complications, dyspnea or stridor noted. Pt. Achieved -22 on NIF & 800L on VC. Pt. Was instructed on IS X 5, highest goal achieved was 766mL.  Claretta Fraise 01/15/2015, 6:07 PM

## 2015-01-15 NOTE — Transfer of Care (Signed)
Immediate Anesthesia Transfer of Care Note  Patient: [name]  Procedure(s) Performed: Procedure(s) with comments: BENTALL PROCEDURE (N/A) - CIRC ARREST TRANSESOPHAGEAL ECHOCARDIOGRAM (TEE) (N/A)  Patient Location: ICU  Anesthesia Type:General  Level of Consciousness: sedated and Patient remains intubated per anesthesia plan  Airway & Oxygen Therapy: Patient remains intubated per anesthesia plan and Patient placed on Ventilator (see vital sign flow sheet for setting)  Post-op Assessment: Report given to RN, Post -op Vital signs reviewed and stable and Patient moving all extremities X 4  Post vital signs: Reviewed and stable  Last Vitals:  Filed Vitals:   01/15/15 0625  BP: 144/82  Pulse: 87  Temp: 36.4 C  Resp: 20    Complications: No apparent anesthesia complications

## 2015-01-16 ENCOUNTER — Encounter (HOSPITAL_COMMUNITY): Payer: Self-pay | Admitting: Surgery

## 2015-01-16 ENCOUNTER — Inpatient Hospital Stay (HOSPITAL_COMMUNITY): Payer: PRIVATE HEALTH INSURANCE

## 2015-01-16 LAB — TYPE AND SCREEN
ABO/RH(D): O POS
Antibody Screen: NEGATIVE
UNIT DIVISION: 0
UNIT DIVISION: 0
UNIT DIVISION: 0
UNIT DIVISION: 0
Unit division: 0
Unit division: 0

## 2015-01-16 LAB — POCT I-STAT, CHEM 8
BUN: 8 mg/dL (ref 6–20)
CALCIUM ION: 1.16 mmol/L (ref 1.12–1.23)
CREATININE: 0.7 mg/dL (ref 0.44–1.00)
Chloride: 97 mmol/L — ABNORMAL LOW (ref 101–111)
GLUCOSE: 139 mg/dL — AB (ref 65–99)
HCT: 35 % — ABNORMAL LOW (ref 36.0–46.0)
HEMOGLOBIN: 11.9 g/dL — AB (ref 12.0–15.0)
POTASSIUM: 4.1 mmol/L (ref 3.5–5.1)
Sodium: 134 mmol/L — ABNORMAL LOW (ref 135–145)
TCO2: 23 mmol/L (ref 0–100)

## 2015-01-16 LAB — BASIC METABOLIC PANEL
ANION GAP: 8 (ref 5–15)
BUN: 7 mg/dL (ref 6–20)
CALCIUM: 8.1 mg/dL — AB (ref 8.9–10.3)
CHLORIDE: 102 mmol/L (ref 101–111)
CO2: 25 mmol/L (ref 22–32)
CREATININE: 0.74 mg/dL (ref 0.44–1.00)
GFR calc non Af Amer: >60 mL/min (ref >60)
GLUCOSE: 124 mg/dL — AB (ref 65–99)
Potassium: 4.7 mmol/L (ref 3.5–5.1)
Sodium: 135 mmol/L (ref 135–145)

## 2015-01-16 LAB — CBC
HEMATOCRIT: 32.7 % — AB (ref 36.0–46.0)
HEMATOCRIT: 32.2 % — AB (ref 36.0–46.0)
HEMOGLOBIN: 11.7 g/dL — AB (ref 12.0–15.0)
HEMOGLOBIN: 11 g/dL — AB (ref 12.0–15.0)
MCH: 32.4 pg (ref 26.0–34.0)
MCH: 31 pg (ref 26.0–34.0)
MCHC: 35.8 g/dL (ref 30.0–36.0)
MCHC: 34.2 g/dL (ref 30.0–36.0)
MCV: 90.6 fL (ref 78.0–100.0)
MCV: 90.7 fL (ref 78.0–100.0)
Platelets: 120 10*3/uL — ABNORMAL LOW (ref 150–400)
Platelets: 149 10*3/uL — ABNORMAL LOW (ref 150–400)
RBC: 3.61 MIL/uL — ABNORMAL LOW (ref 3.87–5.11)
RBC: 3.55 MIL/uL — AB (ref 3.87–5.11)
RDW: 14 % (ref 11.5–15.5)
RDW: 14.1 % (ref 11.5–15.5)
WBC: 16.9 10*3/uL — ABNORMAL HIGH (ref 4.0–10.5)
WBC: 16.8 10*3/uL — AB (ref 4.0–10.5)

## 2015-01-16 LAB — CREATININE, SERUM: Creatinine, Ser: 0.8 mg/dL (ref 0.44–1.00)

## 2015-01-16 LAB — GLUCOSE, CAPILLARY
GLUCOSE-CAPILLARY: 88 mg/dL (ref 65–99)
GLUCOSE-CAPILLARY: 120 mg/dL — AB (ref 65–99)

## 2015-01-16 LAB — MAGNESIUM
MAGNESIUM: 2.1 mg/dL (ref 1.7–2.4)
Magnesium: 2.6 mg/dL — ABNORMAL HIGH (ref 1.7–2.4)

## 2015-01-16 MED ORDER — KETOROLAC TROMETHAMINE 15 MG/ML IJ SOLN
15.0000 mg | Freq: Four times a day (QID) | INTRAMUSCULAR | Status: AC
  Administered 2015-01-16 – 2015-01-17 (×2): 15 mg via INTRAVENOUS
  Filled 2015-01-16 (×2): qty 1

## 2015-01-16 MED ORDER — ASPIRIN 81 MG PO CHEW: 81.0000 mg | CHEWABLE_TABLET | Freq: Every day | ORAL | Status: DC

## 2015-01-16 MED ORDER — KETOROLAC TROMETHAMINE 15 MG/ML IJ SOLN: 15.0000 mg | Freq: Once | INTRAMUSCULAR | Status: DC

## 2015-01-16 MED ORDER — ASPIRIN EC 81 MG PO TBEC: 81.0000 mg | DELAYED_RELEASE_TABLET | Freq: Every day | ORAL | Status: DC

## 2015-01-16 MED ORDER — WARFARIN SODIUM 2.5 MG PO TABS
2.5000 mg | ORAL_TABLET | Freq: Once | ORAL | Status: AC
  Administered 2015-01-16: 2.5 mg via ORAL
  Filled 2015-01-16: qty 1

## 2015-01-16 MED ORDER — WARFARIN - PHYSICIAN DOSING INPATIENT: Freq: Every day | Status: DC

## 2015-01-16 NOTE — Progress Notes (Signed)
1 Day Post-Op Procedure(s) (LRB): BENTALL PROCEDURE (N/A) TRANSESOPHAGEAL ECHOCARDIOGRAM (TEE) (N/A) Subjective:  No complaints. Pain under control  Objective: Vital signs in last 24 hours: Temp:  [97 F (36.1 C)-98.4 F (36.9 C)] 98.2 F (36.8 C) (11/04 0800) Pulse Rate:  [88-91] 88 (11/04 0800) Cardiac Rhythm:  [-] Atrial paced (11/04 0800) Resp:  [8-26] 26 (11/04 0800) BP: (81-114)/(51-87) 94/58 mmHg (11/04 0800) SpO2:  [95 %-100 %] 95 % (11/04 0800) Arterial Line BP: (82-132)/(46-80) 93/51 mmHg (11/04 0800) FiO2 (%):  [40 %-50 %] 40 % (11/03 1735) Weight:  [97.3 kg (214 lb 8.1 oz)] 97.3 kg (214 lb 8.1 oz) (11/04 0500)  Hemodynamic parameters for last 24 hours: PAP: (16-26)/(9-18) 26/18 mmHg CO:  [2.9 L/min-6 L/min] 4.8 L/min CI:  [1.5 L/min/m2-3 L/min/m2] 2.4 L/min/m2  Intake/Output from previous day: 11/03 0701 - 11/04 0700 In: 4802.6 [P.O.:100; I.V.:2232.6; Blood:740; NG/GT:30; IV Piggyback:1700] Out: 7782 [Urine:5795; Emesis/NG output:300; Blood:1500; Chest Tube:400] Intake/Output this shift: Total I/O In: 20 [I.V.:20] Out: 115 [Urine:65; Chest Tube:50]  General appearance: alert and cooperative Neurologic: intact Heart: regular rate and rhythm and crisp mechanical valve click, no murmur Lungs: clear to auscultation bilaterally Extremities: extremities normal, atraumatic, no cyanosis or edema Wound: dressing dry  Lab Results:  Recent Labs  01/15/15 2122 01/16/15 0336  WBC 13.7* 16.9*  HGB 12.3 11.7*  HCT 34.7* 32.7*  PLT 140* 120*   BMET:  Recent Labs  01/13/15 1056  01/15/15 2100 01/15/15 2122 01/16/15 0336  NA 136  < > 142  --  135  K 4.1  < > 3.5  --  4.7  CL 105  < > 104  --  102  CO2 19*  --   --   --  25  GLUCOSE 111*  < > 103*  --  124*  BUN 12  < > 10  --  7  CREATININE 0.71  < > 0.70 0.84 0.74  CALCIUM 9.7  --   --   --  8.1*  < > = values in this interval not displayed.  PT/INR:  Recent Labs  01/15/15 1510  LABPROT 17.3*  INR  1.40   ABG    Component Value Date/Time   PHART 7.389 01/15/2015 1911   HCO3 28.3* 01/15/2015 1911   TCO2 23 01/15/2015 2100   ACIDBASEDEF 2.0 01/15/2015 1759   O2SAT 99.0 01/15/2015 1911   CBG (last 3)   Recent Labs  01/15/15 2245 01/15/15 2347 01/16/15 0410  GLUCAP 88 145* 120*   CXR: mild bibasilar atelectasis.  ECG: pending  Assessment/Plan: S/P Procedure(s) (LRB): BENTALL PROCEDURE (N/A) TRANSESOPHAGEAL ECHOCARDIOGRAM (TEE) (N/A)  She is hemodynamically stable in sinus 70's. BP is low normal so will hold off on beta blocker and pace as needed. Volume excess: diurese one BP comes up. DC chest tubes, swan, a-line. Start Coumadin slowly tonight. No ASA. Mobilize Continue foley due to patient in ICU and urinary output monitoring See progression orders   LOS: 1 day    [name] 01/16/2015

## 2015-01-16 NOTE — Progress Notes (Signed)
CT surgery p.m. Rounds  Patient doing well after redo Bentall procedure/mechanical valve Complains of incisional pain-we'll order low-dose toradol P.m. labs reviewed and are satisfactory Paced rhythm with stable blood pressure Coumadin dose initiated this p.m., 2.5 mg

## 2015-01-17 ENCOUNTER — Inpatient Hospital Stay (HOSPITAL_COMMUNITY): Payer: PRIVATE HEALTH INSURANCE

## 2015-01-17 DIAGNOSIS — I319 Disease of pericardium, unspecified: Secondary | ICD-10-CM

## 2015-01-17 LAB — CBC
HCT: 28.2 % — ABNORMAL LOW (ref 36.0–46.0)
Hemoglobin: 9.5 g/dL — ABNORMAL LOW (ref 12.0–15.0)
MCH: 30.9 pg (ref 26.0–34.0)
MCHC: 33.7 g/dL (ref 30.0–36.0)
MCV: 91.9 fL (ref 78.0–100.0)
PLATELETS: 131 10*3/uL — AB (ref 150–400)
RBC: 3.07 MIL/uL — ABNORMAL LOW (ref 3.87–5.11)
RDW: 14 % (ref 11.5–15.5)
WBC: 14.1 10*3/uL — ABNORMAL HIGH (ref 4.0–10.5)

## 2015-01-17 LAB — BASIC METABOLIC PANEL
Anion gap: 7 (ref 5–15)
BUN: 7 mg/dL (ref 6–20)
CALCIUM: 8.8 mg/dL — AB (ref 8.9–10.3)
CHLORIDE: 99 mmol/L — AB (ref 101–111)
CO2: 28 mmol/L (ref 22–32)
CREATININE: 0.74 mg/dL (ref 0.44–1.00)
GFR calc Af Amer: >60 mL/min (ref >60)
GFR calc non Af Amer: >60 mL/min (ref >60)
GLUCOSE: 130 mg/dL — AB (ref 65–99)
Potassium: 4.3 mmol/L (ref 3.5–5.1)
Sodium: 134 mmol/L — ABNORMAL LOW (ref 135–145)

## 2015-01-17 LAB — PROTIME-INR
INR: 1.36 (ref 0.00–1.49)
PROTHROMBIN TIME: 16.9 s — AB (ref 11.6–15.2)

## 2015-01-17 LAB — GLUCOSE, CAPILLARY: Glucose-Capillary: 109 mg/dL — ABNORMAL HIGH (ref 65–99)

## 2015-01-17 MED ORDER — SODIUM CHLORIDE 0.9 % IV SOLN: 250.0000 mL | INTRAVENOUS | Status: DC | PRN

## 2015-01-17 MED ORDER — SODIUM CHLORIDE 0.9 % IJ SOLN: 3.0000 mL | INTRAMUSCULAR | Status: DC | PRN

## 2015-01-17 MED ORDER — FUROSEMIDE 40 MG PO TABS
40.0000 mg | ORAL_TABLET | Freq: Every day | ORAL | Status: DC
  Administered 2015-01-18: 40 mg via ORAL
  Filled 2015-01-17: qty 1

## 2015-01-17 MED ORDER — POTASSIUM CHLORIDE CRYS ER 10 MEQ PO TBCR
10.0000 meq | EXTENDED_RELEASE_TABLET | Freq: Every day | ORAL | Status: DC
  Administered 2015-01-18: 10 meq via ORAL
  Filled 2015-01-17: qty 1

## 2015-01-17 MED ORDER — FUROSEMIDE 10 MG/ML IJ SOLN
20.0000 mg | Freq: Once | INTRAMUSCULAR | Status: AC
  Administered 2015-01-17: 20 mg via INTRAVENOUS
  Filled 2015-01-17: qty 2

## 2015-01-17 MED ORDER — AMPHETAMINE-DEXTROAMPHET ER 10 MG PO CP24
20.0000 mg | ORAL_CAPSULE | Freq: Every day | ORAL | Status: DC
  Administered 2015-01-17 – 2015-01-19 (×3): 20 mg via ORAL
  Filled 2015-01-17 (×3): qty 2

## 2015-01-17 MED ORDER — WARFARIN SODIUM 2.5 MG PO TABS
2.5000 mg | ORAL_TABLET | Freq: Once | ORAL | Status: AC
  Administered 2015-01-17: 2.5 mg via ORAL
  Filled 2015-01-17: qty 1

## 2015-01-17 MED ORDER — INSULIN ASPART 100 UNIT/ML ~~LOC~~ SOLN: 0.0000 [IU] | Freq: Three times a day (TID) | SUBCUTANEOUS | Status: DC

## 2015-01-17 MED ORDER — MOVING RIGHT ALONG BOOK
Freq: Once | Status: AC
  Administered 2015-01-17: 18:00:00
  Filled 2015-01-17: qty 1

## 2015-01-17 MED ORDER — MAGNESIUM HYDROXIDE 400 MG/5ML PO SUSP: 30.0000 mL | Freq: Every day | ORAL | Status: DC | PRN

## 2015-01-17 MED ORDER — SODIUM CHLORIDE 0.9 % IJ SOLN
3.0000 mL | Freq: Two times a day (BID) | INTRAMUSCULAR | Status: DC
Start: 2015-01-17 — End: 2015-01-21
  Administered 2015-01-18 – 2015-01-20 (×5): 3 mL via INTRAVENOUS

## 2015-01-17 MED ORDER — ALUM & MAG HYDROXIDE-SIMETH 200-200-20 MG/5ML PO SUSP: 15.0000 mL | ORAL | Status: DC | PRN

## 2015-01-17 NOTE — Progress Notes (Signed)
  Echocardiogram 2D Echocardiogram has been performed.  [name] 01/17/2015, 9:43 AM

## 2015-01-17 NOTE — Progress Notes (Signed)
2 Days Post-Op Procedure(s) (LRB): BENTALL PROCEDURE (N/A) TRANSESOPHAGEAL ECHOCARDIOGRAM (TEE) (N/A) Subjective: Chest x-ray report indicated wide mediastinum although inspiratory lung volumes are reduced today Bedside echocardiogram shows no evidence of pericardial effusion, good LV function, normal aVR function Pain under better control Patient ready for transfer to stepdown  Objective: Vital signs in last 24 hours: Temp:  [98.4 F (36.9 C)-98.9 F (37.2 C)] 98.4 F (36.9 C) (11/05 0827) Pulse Rate:  [88-110] 96 (11/05 1100) Cardiac Rhythm:  [-] Normal sinus rhythm (11/05 0800) Resp:  [12-29] 15 (11/05 1100) BP: (85-130)/(61-85) 103/77 mmHg (11/05 1100) SpO2:  [90 %-100 %] 96 % (11/05 1100) Weight:  [212 lb 8.4 oz (96.4 kg)] 212 lb 8.4 oz (96.4 kg) (11/05 0500)  Hemodynamic parameters for last 24 hours:   stable  Intake/Output from previous day: 11/04 0701 - 11/05 0700 In: 1180 [P.O.:600; I.V.:480; IV Piggyback:100] Out: 2446 [Urine:1295; Chest Tube:120] Intake/Output this shift: Total I/O In: 180 [P.O.:120; I.V.:60] Out: 425 [Urine:425]  Neuro intact Lungs clear Sharp aortic valve closure sound  Lab Results:  Recent Labs  01/16/15 1700 01/16/15 1701 01/17/15 0438  WBC 16.8*  --  14.1*  HGB 11.0* 11.9* 9.5*  HCT 32.2* 35.0* 28.2*  PLT 149*  --  131*   BMET:  Recent Labs  01/16/15 0336  01/16/15 1701 01/17/15 0438  NA 135  --  134* 134*  K 4.7  --  4.1 4.3  CL 102  --  97* 99*  CO2 25  --   --  28  GLUCOSE 124*  --  139* 130*  BUN 7  --  8 7  CREATININE 0.74  < > 0.70 0.74  CALCIUM 8.1*  --   --  8.8*  < > = values in this interval not displayed.  PT/INR:  Recent Labs  01/17/15 0438  LABPROT 16.9*  INR 1.36   ABG    Component Value Date/Time   PHART 7.389 01/15/2015 1911   HCO3 28.3* 01/15/2015 1911   TCO2 23 01/16/2015 1701   ACIDBASEDEF 2.0 01/15/2015 1759   O2SAT 99.0 01/15/2015 1911   CBG (last 3)   Recent Labs  01/15/15 2245  01/15/15 2347 01/16/15 0410  GLUCAP 88 145* 120*    Assessment/Plan: S/P Procedure(s) (LRB): BENTALL PROCEDURE (N/A) TRANSESOPHAGEAL ECHOCARDIOGRAM (TEE) (N/A) Mobilize Diuresis Plan for transfer to step-down: see transfer orders Continue Coumadin for mechanical aVR   LOS: 2 days    Tharon Aquas Trigt III 01/17/2015

## 2015-01-17 NOTE — Progress Notes (Signed)
CRITICAL VALUE ALERT  Critical value received:  Widened mediastinum on Xray comparison  Date of notification:  01/17/2015  Time of notification:  0715  Critical value read back:Yes.    Nurse who received alert:  Arie Sabina  MD notified (1st page):  Dr. Prescott Gum  Time of first page:  0718  MD notified (2nd page):  Time of second page:  Responding MD:  Dr. Prescott Gum  Time MD responded:  [phone]

## 2015-01-17 NOTE — Progress Notes (Signed)
Pt ambulated 150 ft independently with the RN. Pt tolerated well. HR reached up to 140. After resting HR returned to low 100s. And patient stated she felt good but tired.   Will continue to monitor.

## 2015-01-18 ENCOUNTER — Inpatient Hospital Stay (HOSPITAL_COMMUNITY): Payer: PRIVATE HEALTH INSURANCE

## 2015-01-18 LAB — BASIC METABOLIC PANEL
Anion gap: 8 (ref 5–15)
BUN: 10 mg/dL (ref 6–20)
CO2: 28 mmol/L (ref 22–32)
Calcium: 9 mg/dL (ref 8.9–10.3)
Chloride: 98 mmol/L — ABNORMAL LOW (ref 101–111)
Creatinine, Ser: 0.81 mg/dL (ref 0.44–1.00)
GFR calc Af Amer: >60 mL/min (ref >60)
GFR calc non Af Amer: >60 mL/min (ref >60)
Glucose, Bld: 118 mg/dL — ABNORMAL HIGH (ref 65–99)
Potassium: 3.7 mmol/L (ref 3.5–5.1)
Sodium: 134 mmol/L — ABNORMAL LOW (ref 135–145)

## 2015-01-18 LAB — CBC
HCT: 27.3 % — ABNORMAL LOW (ref 36.0–46.0)
Hemoglobin: 9.3 g/dL — ABNORMAL LOW (ref 12.0–15.0)
MCH: 31.5 pg (ref 26.0–34.0)
MCHC: 34.1 g/dL (ref 30.0–36.0)
MCV: 92.5 fL (ref 78.0–100.0)
Platelets: 171 10*3/uL (ref 150–400)
RBC: 2.95 MIL/uL — ABNORMAL LOW (ref 3.87–5.11)
RDW: 13.9 % (ref 11.5–15.5)
WBC: 15.4 10*3/uL — ABNORMAL HIGH (ref 4.0–10.5)

## 2015-01-18 LAB — GLUCOSE, CAPILLARY
GLUCOSE-CAPILLARY: 87 mg/dL (ref 65–99)
GLUCOSE-CAPILLARY: 82 mg/dL (ref 65–99)
Glucose-Capillary: 81 mg/dL (ref 65–99)
Glucose-Capillary: 95 mg/dL (ref 65–99)

## 2015-01-18 LAB — PROTIME-INR
INR: 1.29 (ref 0.00–1.49)
Prothrombin Time: 16.2 seconds — ABNORMAL HIGH (ref 11.6–15.2)

## 2015-01-18 MED ORDER — METOPROLOL TARTRATE 12.5 MG HALF TABLET
12.5000 mg | ORAL_TABLET | Freq: Two times a day (BID) | ORAL | Status: DC
  Administered 2015-01-18 – 2015-01-21 (×7): 12.5 mg via ORAL
  Filled 2015-01-18 (×7): qty 1

## 2015-01-18 MED ORDER — COUMADIN BOOK
Freq: Once | Status: AC
  Administered 2015-01-18: 20:00:00
  Filled 2015-01-18 (×2): qty 1

## 2015-01-18 MED ORDER — WARFARIN SODIUM 5 MG PO TABS
5.0000 mg | ORAL_TABLET | Freq: Every day | ORAL | Status: AC
  Administered 2015-01-18: 5 mg via ORAL
  Filled 2015-01-18: qty 1

## 2015-01-18 NOTE — Discharge Instructions (Addendum)
Information on my medicine - Coumadin   (Warfarin)  Why was Coumadin prescribed for you? Coumadin was prescribed for you because you have a blood clot or a medical condition that can cause an increased risk of forming blood clots. Blood clots can cause serious health problems by blocking the flow of blood to the heart, lung, or brain. Coumadin can prevent harmful blood clots from forming. As a reminder your indication for Coumadin is:   Blood Clot Prevention After Heart Valve Surgery  What test will check on my response to Coumadin? While on Coumadin (warfarin) you will need to have an INR test regularly to ensure that your dose is keeping you in the desired range. The INR (international normalized ratio) number is calculated from the result of the laboratory test called prothrombin time (PT).  If an INR APPOINTMENT HAS NOT ALREADY BEEN MADE FOR YOU please schedule an appointment to have this lab work done by your health care provider within 7 days. Your INR goal is usually a number between:  2 to 3 or your provider may give you a more narrow range like 2-2.5.  Ask your health care provider during an office visit what your goal INR is.  What  do you need to  know  About  COUMADIN? Take Coumadin (warfarin) exactly as prescribed by your healthcare provider about the same time each day.  DO NOT stop taking without talking to the doctor who prescribed the medication.  Stopping without other blood clot prevention medication to take the place of Coumadin may increase your risk of developing a new clot or stroke.  Get refills before you run out.  What do you do if you miss a dose? If you miss a dose, take it as soon as you remember on the same day then continue your regularly scheduled regimen the next day.  Do not take two doses of Coumadin at the same time.  Important Safety Information A possible side effect of Coumadin (Warfarin) is an increased risk of bleeding. You should call your healthcare  provider right away if you experience any of the following: ? Bleeding from an injury or your nose that does not stop. ? Unusual colored urine (red or dark brown) or unusual colored stools (red or black). ? Unusual bruising for unknown reasons. ? A serious fall or if you hit your head (even if there is no bleeding).  Some foods or medicines interact with Coumadin (warfarin) and might alter your response to warfarin. To help avoid this: ? Eat a balanced diet, maintaining a consistent amount of Vitamin K. ? Notify your provider about major diet changes you plan to make. ? Avoid alcohol or limit your intake to 1 drink for women and 2 drinks for men per day. (1 drink is 5 oz. wine, 12 oz. beer, or 1.5 oz. liquor.)  Make sure that ANY health care provider who prescribes medication for you knows that you are taking Coumadin (warfarin).  Also make sure the healthcare provider who is monitoring your Coumadin knows when you have started a new medication including herbals and non-prescription products.  Coumadin (Warfarin)  Major Drug Interactions  Increased Warfarin Effect Decreased Warfarin Effect  Alcohol (large quantities) Antibiotics (esp. Septra/Bactrim, Flagyl, Cipro) Amiodarone (Cordarone) Aspirin (ASA) Cimetidine (Tagamet) Megestrol (Megace) NSAIDs (ibuprofen, naproxen, etc.) Piroxicam (Feldene) Propafenone (Rythmol SR) Propranolol (Inderal) Isoniazid (INH) Posaconazole (Noxafil) Barbiturates (Phenobarbital) Carbamazepine (Tegretol) Chlordiazepoxide (Librium) Cholestyramine (Questran) Griseofulvin Oral Contraceptives Rifampin Sucralfate (Carafate) Vitamin K   Coumadin (Warfarin) Major  Herbal Interactions  Increased Warfarin Effect Decreased Warfarin Effect  Garlic Ginseng Ginkgo biloba Coenzyme Q10 Green tea St. Johns wort    Coumadin (Warfarin) FOOD Interactions  Eat a consistent number of servings per week of foods HIGH in Vitamin K (1 serving =  cup)  Collards  (cooked, or boiled & drained) Kale (cooked, or boiled & drained) Mustard greens (cooked, or boiled & drained) Parsley *serving size only =  cup Spinach (cooked, or boiled & drained) Swiss chard (cooked, or boiled & drained) Turnip greens (cooked, or boiled & drained)  Eat a consistent number of servings per week of foods MEDIUM-HIGH in Vitamin K (1 serving = 1 cup)  Asparagus (cooked, or boiled & drained) Broccoli (cooked, boiled & drained, or raw & chopped) Brussel sprouts (cooked, or boiled & drained) *serving size only =  cup Lettuce, raw (green leaf, endive, romaine) Spinach, raw Turnip greens, raw & chopped   These websites have more information on Coumadin (warfarin):  FailFactory.se; VeganReport.com.au; Aortic Valve Replacement, Care After Refer to this sheet in the next few weeks. These instructions provide you with information on caring for yourself after your procedure. Your health care provider may also give you specific instructions. Your treatment has been planned according to current medical practices, but problems sometimes occur. Call your health care provider if you have any problems or questions after your procedure. HOME CARE INSTRUCTIONS   Take medicines only as directed by your health care provider.  If your health care provider has prescribed elastic stockings, wear them as directed.  Take frequent naps or rest often throughout the day.  Avoid lifting over 10 lbs (4.5 kg) or pushing or pulling things with your arms for 6-8 weeks or as directed by your health care provider.  Avoid driving or airplane travel for 4-6 weeks after surgery or as directed by your health care provider. If you are riding in a car for an extended period, stop every 1-2 hours to stretch your legs. Keep a record of your medicines and medical history with you when traveling.  Do not drive or operate heavy machinery while taking pain medicine. (narcotics).  Do not cross  your legs.  Do not use any tobacco products including cigarettes, chewing tobacco, or electronic cigarettes. If you need help quitting, ask your health care provider.  Do not take baths, swim, or use a hot tub until your health care provider approves. Take showers once your health care provider approves. Pat incisions dry. Do not rub incisions with a washcloth or towel.  Avoid climbing stairs and using the handrail to pull yourself up for the first 2-3 weeks after surgery.  Return to work as directed by your health care provider.  Drink enough fluid to keep your urine clear or pale yellow.  Do not strain to have a bowel movement. Eat high-fiber foods if you become constipated. You may also take a medicine to help you have a bowel movement (laxative) as directed by your health care provider.  Resume sexual activity as directed by your health care provider. Men should not use medicines for erectile dysfunction until their doctor says it isokay.  If you had a certain type of heart condition in the past, you may need to take antibiotic medicine before having dental work or surgery. Let your dentist and health care providers know if you had one or more of the following:  Previous endocarditis.  An artificial (prosthetic) heart valve.  Congenital heart disease. SEEK MEDICAL CARE IF:  You develop a skin rash.   You experience sudden changes in your weight.  You have a fever. SEEK IMMEDIATE MEDICAL CARE IF:   You develop chest pain that is not coming from your incision.  You have drainage (pus), redness, swelling, or pain at your incision site.   You develop shortness of breath or have difficulty breathing.   You have increased bleeding from your incision site.   You develop light-headedness.  MAKE SURE YOU:   Understand these directions.  Will watch your condition.  Will get help right away if you are not doing well or get worse.   This information is not intended to  replace advice given to you by your health care provider. Make sure you discuss any questions you have with your health care provider.   Document Released: 09/16/2004 Document Revised: 03/21/2014 Document Reviewed: 12/13/2011 Elsevier Interactive Patient Education [employer].

## 2015-01-18 NOTE — Progress Notes (Addendum)
      [address]       [place] [address]             [phone]      3 Days Post-Op Procedure(s) (LRB): BENTALL PROCEDURE (N/A) TRANSESOPHAGEAL ECHOCARDIOGRAM (TEE) (N/A)   Subjective:  [name] complains of pain behind her shoulder blades.  She states this has been present since surgery. + ambulation  Objective: Vital signs in last 24 hours: Temp:  [98.1 F (36.7 C)-98.2 F (36.8 C)] 98.2 F (36.8 C) (11/06 0453) Pulse Rate:  [88-121] 106 (11/06 0453) Cardiac Rhythm:  [-] Normal sinus rhythm (11/06 0754) Resp:  [15-27] 16 (11/06 0453) BP: (93-123)/(64-78) 122/64 mmHg (11/06 0453) SpO2:  [94 %-100 %] 100 % (11/06 0453) Weight:  [209 lb 7 oz (95 kg)] 209 lb 7 oz (95 kg) (11/06 0453)  Intake/Output from previous day: 11/05 0701 - 11/06 0700 In: 420 [P.O.:360; I.V.:60] Out: 975 [Urine:975]  General appearance: alert, cooperative and no distress Heart: regular rate and rhythm Lungs: diminished breath sounds bibasilar Abdomen: soft, non-tender; bowel sounds normal; no masses,  no organomegaly Extremities: edema trace Wound: clean and dry  Lab Results:  Recent Labs  01/17/15 0438 01/18/15 0126  WBC 14.1* 15.4*  HGB 9.5* 9.3*  HCT 28.2* 27.3*  PLT 131* 171   BMET:  Recent Labs  01/17/15 0438 01/18/15 0126  NA 134* 134*  K 4.3 3.7  CL 99* 98*  CO2 28 28  GLUCOSE 130* 118*  BUN 7 10  CREATININE 0.74 0.81  CALCIUM 8.8* 9.0    PT/INR:  Recent Labs  01/18/15 0126  LABPROT 16.2*  INR 1.29   ABG    Component Value Date/Time   PHART 7.389 01/15/2015 1911   HCO3 28.3* 01/15/2015 1911   TCO2 23 01/16/2015 1701   ACIDBASEDEF 2.0 01/15/2015 1759   O2SAT 99.0 01/15/2015 1911   CBG (last 3)   Recent Labs  01/16/15 0410 01/17/15 2103 01/18/15 0607  GLUCAP 120* 109* 87    Assessment/Plan: S/P Procedure(s) (LRB): BENTALL PROCEDURE (N/A) TRANSESOPHAGEAL ECHOCARDIOGRAM (TEE) (N/A)  1. CV-Sinus Tach, pressure 90-130s SBP- will  start low dose Beta Blocker 2. Pulm- no acute issues, off oxygen continue IS...Marland KitchenMarland Kitchen CXR appears stable, no significant effusions, mediastinum remains widened but is stable 3. INR 1.29- decreasing despite dose of Coumadin, will increase to 5 mg daily 4. Renal-creatinine stable, weight is improving.... On lasix 5. Cbgs controlled, not a diabetic will d/c SSIP 6. Dispo- patient with Tachycardia, will start low dose Beta Blocker if pressure allows, increase coumadin as INR is decreasing, d/c EPW today,   LOS: 3 days    [name], [name] 01/18/2015  Cont with lasix and increased dose of coumadin- 5 mg CXR reviewed and is improved B-blocker added patient examined and medical record reviewed,agree with above note. Tharon Aquas Trigt III 01/18/2015

## 2015-01-18 NOTE — Progress Notes (Signed)
dc'ed pacing wires pt. tolerated well 

## 2015-01-19 LAB — GLUCOSE, CAPILLARY
GLUCOSE-CAPILLARY: 106 mg/dL — AB (ref 65–99)
Glucose-Capillary: 79 mg/dL (ref 65–99)

## 2015-01-19 LAB — PROTIME-INR
INR: 1.33 (ref 0.00–1.49)
Prothrombin Time: 16.6 seconds — ABNORMAL HIGH (ref 11.6–15.2)

## 2015-01-19 MED ORDER — WARFARIN SODIUM 5 MG PO TABS
5.0000 mg | ORAL_TABLET | Freq: Once | ORAL | Status: AC
  Administered 2015-01-19: 5 mg via ORAL
  Filled 2015-01-19: qty 1

## 2015-01-19 NOTE — Progress Notes (Addendum)
      [address]       ,[place] [address]             [phone]      4 Days Post-Op Procedure(s) (LRB): BENTALL PROCEDURE (N/A) TRANSESOPHAGEAL ECHOCARDIOGRAM (TEE) (N/A)   Subjective:  [name] has no new complaints.  She states the pain between her shoulder blades has improved.  + ambulation  + BM  Objective: Vital signs in last 24 hours: Temp:  [98.1 F (36.7 C)-98.6 F (37 C)] 98.1 F (36.7 C) (11/07 0439) Pulse Rate:  [95-102] 95 (11/07 0439) Cardiac Rhythm:  [-] Normal sinus rhythm (11/06 1952) Resp:  [18] 18 (11/07 0439) BP: (97-106)/(68-75) 106/75 mmHg (11/07 0439) SpO2:  [96 %-99 %] 99 % (11/07 0439) Weight:  [207 lb 3.2 oz (93.985 kg)] 207 lb 3.2 oz (93.985 kg) (11/07 0439)  Intake/Output from previous day: 11/06 0701 - 11/07 0700 In: 966 [P.O.:960; I.V.:6] Out: -   General appearance: alert, cooperative and no distress Heart: regular rate and rhythm Lungs: clear to auscultation bilaterally Abdomen: soft, non-tender; bowel sounds normal; no masses,  no organomegaly Extremities: edema trace Wound: clean and dry  Lab Results:  Recent Labs  01/17/15 0438 01/18/15 0126  WBC 14.1* 15.4*  HGB 9.5* 9.3*  HCT 28.2* 27.3*  PLT 131* 171   BMET:  Recent Labs  01/17/15 0438 01/18/15 0126  NA 134* 134*  K 4.3 3.7  CL 99* 98*  CO2 28 28  GLUCOSE 130* 118*  BUN 7 10  CREATININE 0.74 0.81  CALCIUM 8.8* 9.0    PT/INR:  Recent Labs  01/19/15 0426  LABPROT 16.6*  INR 1.33   ABG    Component Value Date/Time   PHART 7.389 01/15/2015 1911   HCO3 28.3* 01/15/2015 1911   TCO2 23 01/16/2015 1701   ACIDBASEDEF 2.0 01/15/2015 1759   O2SAT 99.0 01/15/2015 1911   CBG (last 3)   Recent Labs  01/18/15 1117 01/18/15 1616 01/18/15 2124  GLUCAP 82 81 95    Assessment/Plan: S/P Procedure(s) (LRB): BENTALL PROCEDURE (N/A) TRANSESOPHAGEAL ECHOCARDIOGRAM (TEE) (N/A)  1. CV- Sinus Tach- improved, continue Lopressor 12.5 mg  BID 2. Pulm- no acute issues, off oxygen 3. INR 1.33- slow rise, will continue Coumadin at 5 mg daily 4. Renal- weight is back to baseline, will give last dose of Lasix today 5. Dispo- patient is stable, INR slowing increasing, stable for discharge once INR therapeutic  LOS: 4 days    [name] 01/19/2015   Chart reviewed, patient examined, agree with above. She is doing well overall. Just waiting on INR to rise to 2.  No BM yet.

## 2015-01-19 NOTE — Progress Notes (Signed)
CARDIAC REHAB PHASE I   PRE:  Rate/Rhythm: 97 SR  BP:  Supine:   Sitting: 108/75  Standing:    SaO2: 97%RA  MODE:  Ambulation: 475 ft   POST:  Rate/Rhythm: 121 ST  BP:  Supine:   Sitting: 123/82  Standing:    SaO2: 97%RA 0940-1010 Pt walked 475 ft with steady gait. A little dyspneic. Sats good on RA. To sitting on side of bed after walk. Discussed CRP 2 and will refer to [place].  Pt stated she has received Coumadin ed.   Graylon Good, RN BSN  01/19/2015 10:06 AM
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
## 2015-01-19 NOTE — Progress Notes (Addendum)
Called MD on call Dr Servando Snare, regarding coumadin order. Orders received and would evaluate further in AM. Emmalyne Giacomo, Bettina Gavia RN

## 2015-01-20 LAB — PROTIME-INR
INR: 1.49 (ref 0.00–1.49)
Prothrombin Time: 18.1 seconds — ABNORMAL HIGH (ref 11.6–15.2)

## 2015-01-20 MED ORDER — TEMAZEPAM 7.5 MG PO CAPS
7.5000 mg | ORAL_CAPSULE | Freq: Every evening | ORAL | Status: DC | PRN
  Administered 2015-01-20: 7.5 mg via ORAL
  Filled 2015-01-20: qty 1

## 2015-01-20 MED ORDER — WARFARIN SODIUM 5 MG PO TABS
5.0000 mg | ORAL_TABLET | Freq: Once | ORAL | Status: AC
  Administered 2015-01-20: 5 mg via ORAL
  Filled 2015-01-20: qty 1

## 2015-01-20 NOTE — Progress Notes (Addendum)
      [address]       [place] [address]             [phone]      5 Days Post-Op Procedure(s) (LRB): BENTALL PROCEDURE (N/A) TRANSESOPHAGEAL ECHOCARDIOGRAM (TEE) (N/A) Subjective: Feels pretty well, had a BM, not sleeping well  Objective: Vital signs in last 24 hours: Temp:  [98.4 F (36.9 C)-98.9 F (37.2 C)] 98.4 F (36.9 C) (11/08 0507) Pulse Rate:  [84-101] 84 (11/08 0507) Cardiac Rhythm:  [-] Sinus tachycardia (11/07 1900) Resp:  [20] 20 (11/08 0507) BP: (98-114)/(73-80) 103/80 mmHg (11/08 0507) SpO2:  [96 %-100 %] 98 % (11/08 0507) Weight:  [205 lb 8 oz (93.214 kg)] 205 lb 8 oz (93.214 kg) (11/08 0507)  Hemodynamic parameters for last 24 hours:    Intake/Output from previous day: 11/07 0701 - 11/08 0700 In: 840 [P.O.:840] Out: 300 [Urine:300] Intake/Output this shift:    General appearance: alert, cooperative and no distress Heart: regular rate and rhythm and + crisp valve click, soft systolic murmur Lungs: clear to auscultation bilaterally Abdomen: benign Extremities: no edena Wound: incis healing well  Lab Results:  Recent Labs  01/18/15 0126  WBC 15.4*  HGB 9.3*  HCT 27.3*  PLT 171   BMET:  Recent Labs  01/18/15 0126  NA 134*  K 3.7  CL 98*  CO2 28  GLUCOSE 118*  BUN 10  CREATININE 0.81  CALCIUM 9.0    PT/INR:  Recent Labs  01/20/15 0218  LABPROT 18.1*  INR 1.49   ABG    Component Value Date/Time   PHART 7.389 01/15/2015 1911   HCO3 28.3* 01/15/2015 1911   TCO2 23 01/16/2015 1701   ACIDBASEDEF 2.0 01/15/2015 1759   O2SAT 99.0 01/15/2015 1911   CBG (last 3)   Recent Labs  01/18/15 2124 01/19/15 1102 01/19/15 1609  GLUCAP 95 79 106*    Meds Scheduled Meds: . acetaminophen  1,000 mg Oral 4 times per day   Or  . acetaminophen (TYLENOL) oral liquid 160 mg/5 mL  1,000 mg Per Tube 4 times per day  . amphetamine-dextroamphetamine  20 mg Oral Daily  . atorvastatin  10 mg Oral q1800  . bisacodyl   10 mg Oral Daily   Or  . bisacodyl  10 mg Rectal Daily  . buPROPion  300 mg Oral Daily  . docusate sodium  200 mg Oral Daily  . metoprolol tartrate  12.5 mg Oral BID  . pantoprazole  40 mg Oral Daily  . sodium chloride  3 mL Intravenous Q12H  . sodium chloride  3 mL Intravenous Q12H  . Warfarin - Physician Dosing Inpatient   Does not apply q1800   Continuous Infusions: . sodium chloride 20 mL/hr at 01/15/15 1522  . lactated ringers Stopped (01/17/15 1000)  . lactated ringers Stopped (01/15/15 1800)   PRN Meds:.sodium chloride, alum & mag hydroxide-simeth, lactated ringers, magnesium hydroxide, morphine injection, ondansetron (ZOFRAN) IV, oxyCODONE, sodium chloride, sodium chloride, traMADol  Xrays No results found.  Assessment/Plan: S/P Procedure(s) (LRB): BENTALL PROCEDURE (N/A) TRANSESOPHAGEAL ECHOCARDIOGRAM (TEE) (N/A)  1 doing very well, waiting for INR to get to 2, will give 5 mg today  2 cont rehab and pulm toilet  LOS: 5 days    GOLD,WAYNE E 01/20/2015   Chart reviewed, patient examined, agree with above. INR starting to bump

## 2015-01-20 NOTE — Progress Notes (Signed)
CARDIAC REHAB PHASE I   PRE:  Rate/Rhythm: 88 SR  BP:  Supine:   Sitting: 122/78  Standing:    SaO2: 100%RA  MODE:  Ambulation: 550 ft   POST:  Rate/Rhythm: 120 ST  BP:  Supine:   Sitting: 133/96  Standing:    SaO2: 99%RA 1100-1122 Pt walked 550 ft with steady gait. Second walk today. Tolerated well.    Graylon Good, RN BSN  01/20/2015 11:19 AM

## 2015-01-20 NOTE — Progress Notes (Signed)
UR Completed. Ausha Sieh, RN, BSN.  [phone] 

## 2015-01-21 LAB — PROTIME-INR
INR: 1.66 — ABNORMAL HIGH (ref 0.00–1.49)
Prothrombin Time: 19.6 seconds — ABNORMAL HIGH (ref 11.6–15.2)

## 2015-01-21 MED ORDER — OXYCODONE HCL 5 MG PO TABS: 5.0000 mg | ORAL_TABLET | ORAL | Status: DC | PRN

## 2015-01-21 MED ORDER — METOPROLOL TARTRATE 25 MG PO TABS: 25.0000 mg | ORAL_TABLET | Freq: Two times a day (BID) | ORAL | Status: DC

## 2015-01-21 MED ORDER — WARFARIN SODIUM 5 MG PO TABS
5.0000 mg | ORAL_TABLET | Freq: Once | ORAL | Status: DC
Start: 2015-01-21 — End: 2015-08-05

## 2015-01-21 MED ORDER — ACETAMINOPHEN 325 MG PO TABS
650.0000 mg | ORAL_TABLET | Freq: Four times a day (QID) | ORAL | Status: DC | PRN
  Administered 2015-01-21: 650 mg via ORAL
  Filled 2015-01-21: qty 2

## 2015-01-21 MED ORDER — WARFARIN SODIUM 5 MG PO TABS: 5.0000 mg | ORAL_TABLET | Freq: Once | ORAL | Status: DC

## 2015-01-21 NOTE — Progress Notes (Signed)
[id number] Education completed with pt who voiced understanding. Encouraged IS. Gave heart healthy diet for information and encouraged not using sodium. Has had Coumadin ed by pharmacy. Referring to [facility] Phase 2. Did not want to watch post op video as she has had surgery before. Graylon Good RN BSN 01/21/2015 9:32 AM

## 2015-01-21 NOTE — Progress Notes (Addendum)
      [address]       [place] [address]             [phone]      6 Days Post-Op Procedure(s) (LRB): BENTALL PROCEDURE (N/A) TRANSESOPHAGEAL ECHOCARDIOGRAM (TEE) (N/A) Subjective: conts to feel well, not sleeping well and some back pain  Objective: Vital signs in last 24 hours: Temp:  [99.4 F (37.4 C)-99.6 F (37.6 C)] 99.4 F (37.4 C) (11/09 0305) Pulse Rate:  [88-103] 97 (11/09 0305) Cardiac Rhythm:  [-] Normal sinus rhythm (11/08 1912) Resp:  [18-20] 18 (11/09 0305) BP: (114-122)/(69-81) 114/81 mmHg (11/09 0305) SpO2:  [95 %-100 %] 96 % (11/09 0305) Weight:  [204 lb 2.3 oz (92.6 kg)] 204 lb 2.3 oz (92.6 kg) (11/09 0305)  Hemodynamic parameters for last 24 hours:    Intake/Output from previous day: 11/08 0701 - 11/09 0700 In: 600 [P.O.:600] Out: 350 [Urine:350] Intake/Output this shift:    General appearance: alert, cooperative and no distress Heart: regular rate and rhythm and soft syst murmur, + click Lungs: clear to auscultation bilaterally Abdomen: benign Extremities: no edema Wound: incis healing well  Lab Results: No results for input(s): WBC, HGB, HCT, PLT in the last 72 hours. BMET: No results for input(s): NA, K, CL, CO2, GLUCOSE, BUN, CREATININE, CALCIUM in the last 72 hours.  PT/INR:  Recent Labs  01/21/15 0340  LABPROT 19.6*  INR 1.66*   ABG    Component Value Date/Time   PHART 7.389 01/15/2015 1911   HCO3 28.3* 01/15/2015 1911   TCO2 23 01/16/2015 1701   ACIDBASEDEF 2.0 01/15/2015 1759   O2SAT 99.0 01/15/2015 1911   CBG (last 3)   Recent Labs  01/18/15 2124 01/19/15 1102 01/19/15 1609  GLUCAP 95 79 106*    Meds Scheduled Meds: . atorvastatin  10 mg Oral q1800  . bisacodyl  10 mg Oral Daily   Or  . bisacodyl  10 mg Rectal Daily  . buPROPion  300 mg Oral Daily  . docusate sodium  200 mg Oral Daily  . metoprolol tartrate  12.5 mg Oral BID  . pantoprazole  40 mg Oral Daily  . sodium chloride  3 mL  Intravenous Q12H  . sodium chloride  3 mL Intravenous Q12H  . Warfarin - Physician Dosing Inpatient   Does not apply q1800   Continuous Infusions: . sodium chloride 20 mL/hr at 01/15/15 1522  . lactated ringers Stopped (01/17/15 1000)  . lactated ringers Stopped (01/15/15 1800)   PRN Meds:.sodium chloride, alum & mag hydroxide-simeth, lactated ringers, magnesium hydroxide, morphine injection, ondansetron (ZOFRAN) IV, oxyCODONE, sodium chloride, sodium chloride, temazepam, traMADol  Xrays No results found.  Assessment/Plan: S/P Procedure(s) (LRB): BENTALL PROCEDURE (N/A) TRANSESOPHAGEAL ECHOCARDIOGRAM (TEE) (N/A)  1 conts to do well 1 INR cont to slowly increase, cont current coumadin dose   LOS: 6 days    GOLD,WAYNE E 01/21/2015   Chart reviewed, patient examined, agree with above. Her INR is slowly rising and is 1.66 today. I think 5 mg coumadin daily is her dose so I think she can go home today with INR followup on Friday. Her goal is 2.5-3.0. She is doing very well overall.

## 2015-01-23 ENCOUNTER — Encounter (INDEPENDENT_AMBULATORY_CARE_PROVIDER_SITE_OTHER): Payer: Self-pay

## 2015-01-23 ENCOUNTER — Ambulatory Visit (INDEPENDENT_AMBULATORY_CARE_PROVIDER_SITE_OTHER): Payer: PRIVATE HEALTH INSURANCE | Admitting: Pharmacist

## 2015-01-23 DIAGNOSIS — I351 Nonrheumatic aortic (valve) insufficiency: Secondary | ICD-10-CM

## 2015-01-23 DIAGNOSIS — Z952 Presence of prosthetic heart valve: Secondary | ICD-10-CM | POA: Insufficient documentation

## 2015-01-23 DIAGNOSIS — Z5181 Encounter for therapeutic drug level monitoring: Secondary | ICD-10-CM | POA: Diagnosis not present

## 2015-01-23 DIAGNOSIS — Z954 Presence of other heart-valve replacement: Secondary | ICD-10-CM

## 2015-01-23 LAB — POCT INR: INR: 2.3

## 2015-01-23 NOTE — Progress Notes (Signed)

## 2015-01-26 ENCOUNTER — Encounter: Payer: Self-pay | Admitting: *Deleted

## 2015-01-26 ENCOUNTER — Telehealth: Payer: Self-pay

## 2015-01-26 NOTE — Telephone Encounter (Signed)
Called to follow up with patient and assess for any needs.  Left a message for call back.

## 2015-01-30 ENCOUNTER — Ambulatory Visit (INDEPENDENT_AMBULATORY_CARE_PROVIDER_SITE_OTHER): Payer: PRIVATE HEALTH INSURANCE

## 2015-01-30 DIAGNOSIS — Z5181 Encounter for therapeutic drug level monitoring: Secondary | ICD-10-CM | POA: Diagnosis not present

## 2015-01-30 DIAGNOSIS — I351 Nonrheumatic aortic (valve) insufficiency: Secondary | ICD-10-CM

## 2015-01-30 DIAGNOSIS — Z954 Presence of other heart-valve replacement: Secondary | ICD-10-CM | POA: Diagnosis not present

## 2015-01-30 DIAGNOSIS — Z952 Presence of prosthetic heart valve: Secondary | ICD-10-CM

## 2015-01-30 LAB — POCT INR: INR: 2.1

## 2015-02-03 ENCOUNTER — Encounter: Payer: Self-pay | Admitting: Cardiology

## 2015-02-03 ENCOUNTER — Ambulatory Visit (INDEPENDENT_AMBULATORY_CARE_PROVIDER_SITE_OTHER): Payer: PRIVATE HEALTH INSURANCE | Admitting: *Deleted

## 2015-02-03 ENCOUNTER — Ambulatory Visit (INDEPENDENT_AMBULATORY_CARE_PROVIDER_SITE_OTHER): Payer: PRIVATE HEALTH INSURANCE | Admitting: Cardiology

## 2015-02-03 VITALS — BP 106/78 | HR 71 | Ht 64.0 in | Wt 205.4 lb

## 2015-02-03 DIAGNOSIS — Z5181 Encounter for therapeutic drug level monitoring: Secondary | ICD-10-CM

## 2015-02-03 DIAGNOSIS — D649 Anemia, unspecified: Secondary | ICD-10-CM

## 2015-02-03 DIAGNOSIS — I351 Nonrheumatic aortic (valve) insufficiency: Secondary | ICD-10-CM

## 2015-02-03 DIAGNOSIS — I38 Endocarditis, valve unspecified: Secondary | ICD-10-CM

## 2015-02-03 DIAGNOSIS — R002 Palpitations: Secondary | ICD-10-CM | POA: Diagnosis not present

## 2015-02-03 DIAGNOSIS — Z952 Presence of prosthetic heart valve: Secondary | ICD-10-CM

## 2015-02-03 DIAGNOSIS — R000 Tachycardia, unspecified: Secondary | ICD-10-CM

## 2015-02-03 DIAGNOSIS — Z954 Presence of other heart-valve replacement: Secondary | ICD-10-CM

## 2015-02-03 DIAGNOSIS — Q244 Congenital subaortic stenosis: Secondary | ICD-10-CM

## 2015-02-03 LAB — CBC
HCT: 31 % — ABNORMAL LOW (ref 36.0–46.0)
Hemoglobin: 10.6 g/dL — ABNORMAL LOW (ref 12.0–15.0)
MCH: 30.7 pg (ref 26.0–34.0)
MCHC: 34.2 g/dL (ref 30.0–36.0)
MCV: 89.9 fL (ref 78.0–100.0)
MPV: 9 fL (ref 8.6–12.4)
PLATELETS: 356 10*3/uL (ref 150–400)
RBC: 3.45 MIL/uL — ABNORMAL LOW (ref 3.87–5.11)
RDW: 13.9 % (ref 11.5–15.5)
WBC: 6.1 10*3/uL (ref 4.0–10.5)

## 2015-02-03 LAB — BASIC METABOLIC PANEL
BUN: 10 mg/dL (ref 7–25)
CALCIUM: 9.6 mg/dL (ref 8.6–10.2)
CO2: 25 mmol/L (ref 20–31)
CREATININE: 0.76 mg/dL (ref 0.50–1.10)
Chloride: 101 mmol/L (ref 98–110)
Glucose, Bld: 96 mg/dL (ref 65–99)
Potassium: 4.2 mmol/L (ref 3.5–5.3)
Sodium: 136 mmol/L (ref 135–146)

## 2015-02-03 LAB — POCT INR: INR: 2.5

## 2015-02-03 NOTE — Patient Instructions (Signed)
Medication Instructions:  None  Labwork: CBC, BMET today  Testing/Procedures: Your physician has recommended that you wear a 48 hour holter monitor. Holter monitors are medical devices that record the heart's electrical activity. Doctors most often use these monitors to diagnose arrhythmias. Arrhythmias are problems with the speed or rhythm of the heartbeat. The monitor is a small, portable device. You can wear one while you do your normal daily activities. This is usually used to diagnose what is causing palpitations/syncope (passing out).    Follow-Up: Your physician recommends that you schedule a follow-up appointment in: 4 weeks with a PA/NP  Your physician recommends that you schedule a follow-up appointment in: 2 months with Dr. Radford Pax.    Any Other Special Instructions Will Be Listed Below (If Applicable).     If you need a refill on your cardiac medications before your next appointment, please call your pharmacy.
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
## 2015-02-10 ENCOUNTER — Ambulatory Visit (INDEPENDENT_AMBULATORY_CARE_PROVIDER_SITE_OTHER): Payer: PRIVATE HEALTH INSURANCE

## 2015-02-10 ENCOUNTER — Ambulatory Visit (INDEPENDENT_AMBULATORY_CARE_PROVIDER_SITE_OTHER): Payer: PRIVATE HEALTH INSURANCE | Admitting: Pharmacist Clinician (PhC)/ Clinical Pharmacy Specialist

## 2015-02-10 DIAGNOSIS — Z954 Presence of other heart-valve replacement: Secondary | ICD-10-CM | POA: Diagnosis not present

## 2015-02-10 DIAGNOSIS — R002 Palpitations: Secondary | ICD-10-CM

## 2015-02-10 DIAGNOSIS — Z5181 Encounter for therapeutic drug level monitoring: Secondary | ICD-10-CM

## 2015-02-10 DIAGNOSIS — R000 Tachycardia, unspecified: Secondary | ICD-10-CM | POA: Diagnosis not present

## 2015-02-10 DIAGNOSIS — I351 Nonrheumatic aortic (valve) insufficiency: Secondary | ICD-10-CM | POA: Diagnosis not present

## 2015-02-10 DIAGNOSIS — Z952 Presence of prosthetic heart valve: Secondary | ICD-10-CM

## 2015-02-10 LAB — POCT INR: INR: 3.4

## 2015-02-13 ENCOUNTER — Encounter: Payer: Self-pay | Admitting: Medical

## 2015-02-13 ENCOUNTER — Ambulatory Visit (INDEPENDENT_AMBULATORY_CARE_PROVIDER_SITE_OTHER): Payer: PRIVATE HEALTH INSURANCE | Admitting: Medical

## 2015-02-13 VITALS — BP 104/72 | HR 81 | Temp 98.0°F | Ht 64.0 in | Wt 204.8 lb

## 2015-02-13 DIAGNOSIS — J069 Acute upper respiratory infection, unspecified: Secondary | ICD-10-CM | POA: Diagnosis not present

## 2015-02-13 MED ORDER — FLUTICASONE PROPIONATE 50 MCG/ACT NA SUSP: 2.0000 | Freq: Every day | NASAL | Status: DC

## 2015-02-13 MED ORDER — CEPHALEXIN 500 MG PO CAPS: 500.0000 mg | ORAL_CAPSULE | Freq: Two times a day (BID) | ORAL | Status: DC

## 2015-02-13 NOTE — Patient Instructions (Addendum)
Uri vs early sinus infection. Some concern that could develop bronchitis also.  With you heart hx and use of warfarin , I dowant to be careful. I don't think you need antibiotics immediatly.  Start with flonase steroid nasal spray(this may decrease pressure in sinus + ears as well). Use your benzonatate if needed for cough.  If you symptoms worsen as we discussed then start keflex. Notify coumadin clinic in that event and please notify us on Monday that you had to start as well.  Follow up in 7 days or as needed

## 2015-02-13 NOTE — Progress Notes (Signed)
Pre visit review using our clinic review tool, if applicable. No additional management support is needed unless otherwise documented below in the visit note. 
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
## 2015-02-17 ENCOUNTER — Other Ambulatory Visit: Payer: Self-pay | Admitting: *Deleted

## 2015-02-17 DIAGNOSIS — I351 Nonrheumatic aortic (valve) insufficiency: Secondary | ICD-10-CM

## 2015-02-18 ENCOUNTER — Ambulatory Visit
Admission: RE | Admit: 2015-02-18 | Discharge: 2015-02-18 | Disposition: A | Discharge status: Home / Self Care | Payer: PRIVATE HEALTH INSURANCE | Source: Ambulatory Visit | Attending: Surgery | Admitting: Surgery

## 2015-02-18 ENCOUNTER — Ambulatory Visit (INDEPENDENT_AMBULATORY_CARE_PROVIDER_SITE_OTHER): Payer: Self-pay | Admitting: Surgery

## 2015-02-18 ENCOUNTER — Ambulatory Visit (INDEPENDENT_AMBULATORY_CARE_PROVIDER_SITE_OTHER): Payer: PRIVATE HEALTH INSURANCE | Admitting: Pharmacist

## 2015-02-18 ENCOUNTER — Encounter: Payer: Self-pay | Admitting: Surgery

## 2015-02-18 VITALS — BP 126/79 | HR 67 | Resp 20 | Ht 64.0 in | Wt 204.0 lb

## 2015-02-18 DIAGNOSIS — Z954 Presence of other heart-valve replacement: Secondary | ICD-10-CM

## 2015-02-18 DIAGNOSIS — I351 Nonrheumatic aortic (valve) insufficiency: Secondary | ICD-10-CM

## 2015-02-18 DIAGNOSIS — I7121 Aneurysm of the ascending aorta, without rupture: Secondary | ICD-10-CM

## 2015-02-18 DIAGNOSIS — I712 Thoracic aortic aneurysm, without rupture: Secondary | ICD-10-CM

## 2015-02-18 DIAGNOSIS — Z5181 Encounter for therapeutic drug level monitoring: Secondary | ICD-10-CM | POA: Diagnosis not present

## 2015-02-18 DIAGNOSIS — Z952 Presence of prosthetic heart valve: Secondary | ICD-10-CM

## 2015-02-18 LAB — POCT INR: INR: 2.3

## 2015-02-18 NOTE — Progress Notes (Signed)
HPI:  Patient returns for routine postoperative follow-up having undergone replacement of an ascending aortic aneurysm and Bentall procedure with a 21 mm St. Jude mechanical valved graft on 01/15/2015. The patient's early postoperative recovery while in the hospital was notable for an uncomplicated postop course. Since hospital discharge the patient reports that she was having some palpitations and prominent heart beat with a fast heart rate with exertion. She had no shortness of breath or chest pain. She had a 48 hr Holter monitor study that showed normal sinus rhythm with occasional PVC's and PAC's. She feels like this is getting better.  Her INR on 11/22 was 2.5, on 11/29 it was 3.4 and today it is 2.3.  Current Outpatient Prescriptions  Medication Sig Dispense Refill  . acetaminophen (TYLENOL) 500 MG tablet Take 1,000 mg by mouth daily as needed (pain).    Marland Kitchen amoxicillin (AMOXIL) 500 MG capsule Take 4 once 2 hours prior to dental procedure. (Patient taking differently: Take 2,000 mg by mouth See admin instructions. Take 4 capsules (2000 mg) by mouth 2 hours prior to dental procedure) 4 capsule 0  . atorvastatin (LIPITOR) 10 MG tablet Take 1 tablet (10 mg total) by mouth daily. 30 tablet 3  . buPROPion (WELLBUTRIN XL) 300 MG 24 hr tablet Take 1 tablet (300 mg total) by mouth daily. 30 tablet 5  . cholecalciferol (VITAMIN D) 1000 UNITS tablet Take 1,000 Units by mouth daily.    . fluticasone (FLONASE) 50 MCG/ACT nasal spray Place 2 sprays into both nostrils daily. 16 g 1  . metoprolol tartrate (LOPRESSOR) 25 MG tablet Take 1 tablet (25 mg total) by mouth 2 (two) times daily. 60 tablet 1  . omeprazole (PRILOSEC) 20 MG capsule Take 40 mg by mouth daily.    Marland Kitchen oxyCODONE (OXY IR/ROXICODONE) 5 MG immediate release tablet Take 1-2 tablets (5-10 mg total) by mouth every 4 (four) hours as needed for severe pain. 50 tablet 0  . Probiotic Product (PROBIOTIC DAILY) CAPS Take 1 capsule by mouth  daily.     . vitamin C (ASCORBIC ACID) 500 MG tablet Take 1,000 mg by mouth daily.     Marland Kitchen warfarin (COUMADIN) 5 MG tablet Take 1 tablet (5 mg total) by mouth one time only at 6 PM. And as directed by the coumadin clinic 100 tablet 1  . cephALEXin (KEFLEX) 500 MG capsule Take 1 capsule (500 mg total) by mouth 2 (two) times daily. (Patient not taking: Reported on 02/18/2015) 20 capsule 0   No current facility-administered medications for this visit.    Physical Exam: BP 126/79 mmHg  Pulse 67  Resp 20  Ht 5\' 4"  (1.626 m)  Wt 204 lb (92.534 kg)  BMI 35.00 kg/m2  SpO2 97% She looks well. Lung exam is clear. Cardiac exam shows a regular rate and rhythm with crisp mechanical valve sounds. Chest incision is healing well and sternum is stable. There is no peripheral edema.    Diagnostic Tests:  CLINICAL DATA: Status post Bentall procedure on January 15, 2015  EXAM: CHEST 2 VIEW  COMPARISON: PA and lateral chest x-ray of January 18, 2015  FINDINGS: The lungs are well-expanded and clear. The heart and pulmonary vascularity are normal. The mediastinum has decreased in width. There are 7 intact sternal wires. The prosthetic aortic valve is visible. There is no pleural effusion. The bony thorax is unremarkable.  IMPRESSION: There is no active cardiopulmonary disease. Interval resolution of bibasilar atelectasis and small pleural effusions.  Electronically Signed  By: David Martinique M.D.  On: 02/18/2015 09:13   Impression:  Overall I think she is doing well. I suspect that the sensation of a prominent heart beat is due to the closing of her mechanical valve. She was used to severe AI with faint S2 preop. I think she will get used to this.  I told her she could drive her car but should not lift anything heavier than 10 lbs for three months postop. Her INR is within the therapeutic range.   Plan:  She will continue to follow up with her PCP and with Dr. Radford Pax. Her  INR will be followed in the [facility] anticoagulation clinic.    Gaye Pollack, MD [facility] [phone]

## 2015-02-19 ENCOUNTER — Telehealth (HOSPITAL_COMMUNITY): Payer: Self-pay | Admitting: *Deleted

## 2015-02-19 NOTE — Telephone Encounter (Signed)
Pt seen in office by surgeon.  Pt requested call back after she contacts the insurance company for benefits.  Reminded pt that benefits quoted would be good until the end of the calendar year.  Pt aware to ask for 2017 benefits.  Will follow up next week. Cherre Huger, BSN

## 2015-03-06 ENCOUNTER — Ambulatory Visit: Payer: PRIVATE HEALTH INSURANCE | Admitting: Physician Assistant

## 2015-03-06 ENCOUNTER — Ambulatory Visit (INDEPENDENT_AMBULATORY_CARE_PROVIDER_SITE_OTHER): Payer: PRIVATE HEALTH INSURANCE | Admitting: Surgery

## 2015-03-06 DIAGNOSIS — I351 Nonrheumatic aortic (valve) insufficiency: Secondary | ICD-10-CM

## 2015-03-06 DIAGNOSIS — Z5181 Encounter for therapeutic drug level monitoring: Secondary | ICD-10-CM | POA: Diagnosis not present

## 2015-03-06 DIAGNOSIS — Z954 Presence of other heart-valve replacement: Secondary | ICD-10-CM

## 2015-03-06 DIAGNOSIS — Z952 Presence of prosthetic heart valve: Secondary | ICD-10-CM

## 2015-03-06 LAB — POCT INR: INR: 3.6

## 2015-03-17 ENCOUNTER — Ambulatory Visit (INDEPENDENT_AMBULATORY_CARE_PROVIDER_SITE_OTHER): Payer: PRIVATE HEALTH INSURANCE | Admitting: Surgery

## 2015-03-17 DIAGNOSIS — I351 Nonrheumatic aortic (valve) insufficiency: Secondary | ICD-10-CM

## 2015-03-17 DIAGNOSIS — Z952 Presence of prosthetic heart valve: Secondary | ICD-10-CM

## 2015-03-17 DIAGNOSIS — Z5181 Encounter for therapeutic drug level monitoring: Secondary | ICD-10-CM | POA: Diagnosis not present

## 2015-03-17 DIAGNOSIS — Z954 Presence of other heart-valve replacement: Secondary | ICD-10-CM | POA: Diagnosis not present

## 2015-03-17 LAB — POCT INR: INR: 1.8

## 2015-03-19 ENCOUNTER — Telehealth (HOSPITAL_COMMUNITY): Payer: Self-pay | Admitting: *Deleted

## 2015-03-19 NOTE — Telephone Encounter (Signed)
Called for second follow up.  Left message inquiring pt contact with insurance provider for benefits for cardiac rehab. Cherre Huger, BSN

## 2015-03-25 ENCOUNTER — Other Ambulatory Visit: Payer: Self-pay | Admitting: *Deleted

## 2015-03-25 MED ORDER — METOPROLOL TARTRATE 25 MG PO TABS: 25.0000 mg | ORAL_TABLET | Freq: Two times a day (BID) | ORAL | Status: DC

## 2015-03-25 NOTE — Telephone Encounter (Signed)
Medication was given to patient by Jadene Pierini, PA. Ok to refill under Dr Radford Pax? Please advise. Thanks, MI

## 2015-03-26 ENCOUNTER — Ambulatory Visit (INDEPENDENT_AMBULATORY_CARE_PROVIDER_SITE_OTHER): Payer: PRIVATE HEALTH INSURANCE

## 2015-03-26 DIAGNOSIS — Z954 Presence of other heart-valve replacement: Secondary | ICD-10-CM

## 2015-03-26 DIAGNOSIS — Z5181 Encounter for therapeutic drug level monitoring: Secondary | ICD-10-CM | POA: Diagnosis not present

## 2015-03-26 DIAGNOSIS — I351 Nonrheumatic aortic (valve) insufficiency: Secondary | ICD-10-CM | POA: Diagnosis not present

## 2015-03-26 DIAGNOSIS — Z952 Presence of prosthetic heart valve: Secondary | ICD-10-CM

## 2015-03-26 LAB — POCT INR: INR: 2.3

## 2015-03-26 MED ORDER — METOPROLOL TARTRATE 25 MG PO TABS: 25.0000 mg | ORAL_TABLET | Freq: Two times a day (BID) | ORAL | Status: DC

## 2015-03-26 NOTE — Telephone Encounter (Signed)
No medication listed

## 2015-03-26 NOTE — Telephone Encounter (Signed)
Is this ok?

## 2015-03-27 NOTE — Telephone Encounter (Signed)
The medication was metoprolol. It has already been refilled.

## 2015-04-03 ENCOUNTER — Ambulatory Visit (INDEPENDENT_AMBULATORY_CARE_PROVIDER_SITE_OTHER): Payer: PRIVATE HEALTH INSURANCE | Admitting: Family Medicine

## 2015-04-03 ENCOUNTER — Encounter: Payer: Self-pay | Admitting: Family Medicine

## 2015-04-03 VITALS — BP 134/80 | HR 80 | Temp 98.3°F | Ht 64.0 in | Wt 214.2 lb

## 2015-04-03 DIAGNOSIS — Z5181 Encounter for therapeutic drug level monitoring: Secondary | ICD-10-CM

## 2015-04-03 DIAGNOSIS — R350 Frequency of micturition: Secondary | ICD-10-CM

## 2015-04-03 DIAGNOSIS — R002 Palpitations: Secondary | ICD-10-CM

## 2015-04-03 DIAGNOSIS — E785 Hyperlipidemia, unspecified: Secondary | ICD-10-CM | POA: Diagnosis not present

## 2015-04-03 DIAGNOSIS — F988 Other specified behavioral and emotional disorders with onset usually occurring in childhood and adolescence: Secondary | ICD-10-CM

## 2015-04-03 DIAGNOSIS — K219 Gastro-esophageal reflux disease without esophagitis: Secondary | ICD-10-CM

## 2015-04-03 DIAGNOSIS — Q244 Congenital subaortic stenosis: Secondary | ICD-10-CM

## 2015-04-03 DIAGNOSIS — E669 Obesity, unspecified: Secondary | ICD-10-CM

## 2015-04-03 DIAGNOSIS — F909 Attention-deficit hyperactivity disorder, unspecified type: Secondary | ICD-10-CM

## 2015-04-03 MED ORDER — TRAMADOL HCL 50 MG PO TABS: 50.0000 mg | ORAL_TABLET | Freq: Two times a day (BID) | ORAL | Status: DC

## 2015-04-03 MED ORDER — AMPHETAMINE-DEXTROAMPHETAMINE 20 MG PO TABS: 20.0000 mg | ORAL_TABLET | Freq: Two times a day (BID) | ORAL | Status: DC

## 2015-04-03 MED ORDER — ATORVASTATIN CALCIUM 10 MG PO TABS: 10.0000 mg | ORAL_TABLET | Freq: Every day | ORAL | Status: DC

## 2015-04-03 NOTE — Progress Notes (Signed)
Pre visit review using our clinic review tool, if applicable. No additional management support is needed unless otherwise documented below in the visit note. 
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
## 2015-04-03 NOTE — Patient Instructions (Addendum)
Can apply vitamin E and Salon Pas gel to scar Cholesterol  Cholesterol is a white, waxy, fat-like substance needed by your body in small amounts. The liver makes all the cholesterol you need. Cholesterol is carried from the liver by the blood through the blood vessels. Deposits of cholesterol (plaque) may build up on blood vessel walls. These make the arteries narrower and stiffer. Cholesterol plaques increase the risk for heart attack and stroke.  You cannot feel your cholesterol level even if it is very high. The only way to know it is high is with a blood test. Once you know your cholesterol levels, you should keep a record of the test results. Work with your health care provider to keep your levels in the desired range.  WHAT DO THE RESULTS MEAN?  Total cholesterol is a rough measure of all the cholesterol in your blood.   LDL is the so-called bad cholesterol. This is the type that deposits cholesterol in the walls of the arteries. You want this level to be low.   HDL is the good cholesterol because it cleans the arteries and carries the LDL away. You want this level to be high.  Triglycerides are fat that the body can either burn for energy or store. High levels are closely linked to heart disease.  WHAT ARE THE DESIRED LEVELS OF CHOLESTEROL?  Total cholesterol below 200.   LDL below 100 for people at risk, below 70 for those at very high risk.   HDL above 50 is good, above 60 is best.   Triglycerides below 150.  HOW CAN I LOWER MY CHOLESTEROL?  Diet. Follow your diet programs as directed by your health care provider.   Choose fish or white meat chicken and [place], roasted or baked. Limit fatty cuts of red meat, fried foods, and processed meats, such as sausage and lunch meats.   Eat lots of fresh fruits and vegetables.  Choose whole grains, beans, pasta, potatoes, and cereals.   Use only small amounts of olive, corn, or canola oils.   Avoid butter, mayonnaise,  shortening, or palm kernel oils.  Avoid foods with trans fats.   Drink skim or nonfat milk and eat low-fat or nonfat yogurt and cheeses. Avoid whole milk, cream, ice cream, egg yolks, and full-fat cheeses.   Healthy desserts include angel food cake, ginger snaps, animal crackers, hard candy, popsicles, and low-fat or nonfat frozen yogurt. Avoid pastries, cakes, pies, and cookies.   Exercise. Follow your exercise programs as directed by your health care provider.   A regular program helps decrease LDL and raise HDL.   A regular program helps with weight control.   Do things that increase your activity level like gardening, walking, or taking the stairs. Ask your health care provider about how you can be more active in your daily life.   Medicine. Take medicine only as directed by your health care provider.   Medicine may be prescribed by your health care provider to help lower cholesterol and decrease the risk for heart disease.   If you have several risk factors, you may need medicine even if your levels are normal.   This information is not intended to replace advice given to you by your health care provider. Make sure you discuss any questions you have with your health care provider.   Document Released: 11/23/2000 Document Revised: 03/21/2014 Document Reviewed: 12/12/2012 Elsevier Interactive Patient Education [employer].

## 2015-04-12 NOTE — Assessment & Plan Note (Signed)
Avoid offending foods, start probiotics. Do not eat large meals in late evening and consider raising head of bed.  

## 2015-04-12 NOTE — Assessment & Plan Note (Signed)
Tolerating coumadin 

## 2015-04-12 NOTE — Assessment & Plan Note (Signed)
Tolerating statin, encouraged heart healthy diet, avoid trans fats, minimize simple carbs and saturated fats. Increase exercise as tolerated 

## 2015-04-12 NOTE — Assessment & Plan Note (Signed)
Is recovering from surgery well, will continue to monitor

## 2015-04-12 NOTE — Assessment & Plan Note (Signed)
Given refills on her Adderall today

## 2015-04-12 NOTE — Assessment & Plan Note (Signed)
Encouraged DASH diet, decrease po intake and increase exercise as tolerated. Needs 7-8 hours of sleep nightly. Avoid trans fats, eat small, frequent meals every 4-5 hours with lean proteins, complex carbs and healthy fats. Minimize simple carbs 

## 2015-04-16 ENCOUNTER — Ambulatory Visit (INDEPENDENT_AMBULATORY_CARE_PROVIDER_SITE_OTHER): Payer: PRIVATE HEALTH INSURANCE | Admitting: *Deleted

## 2015-04-16 DIAGNOSIS — I351 Nonrheumatic aortic (valve) insufficiency: Secondary | ICD-10-CM

## 2015-04-16 DIAGNOSIS — Z5181 Encounter for therapeutic drug level monitoring: Secondary | ICD-10-CM | POA: Diagnosis not present

## 2015-04-16 DIAGNOSIS — Z954 Presence of other heart-valve replacement: Secondary | ICD-10-CM

## 2015-04-16 DIAGNOSIS — I359 Nonrheumatic aortic valve disorder, unspecified: Secondary | ICD-10-CM

## 2015-04-16 DIAGNOSIS — Z952 Presence of prosthetic heart valve: Secondary | ICD-10-CM

## 2015-04-16 LAB — POCT INR: INR: 2.3

## 2015-05-08 ENCOUNTER — Ambulatory Visit: Payer: Self-pay | Admitting: Family Medicine
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
## 2015-05-11 ENCOUNTER — Encounter: Payer: Self-pay | Admitting: Cardiology

## 2015-05-11 ENCOUNTER — Ambulatory Visit (INDEPENDENT_AMBULATORY_CARE_PROVIDER_SITE_OTHER): Payer: PRIVATE HEALTH INSURANCE | Admitting: *Deleted

## 2015-05-11 ENCOUNTER — Ambulatory Visit (INDEPENDENT_AMBULATORY_CARE_PROVIDER_SITE_OTHER): Payer: PRIVATE HEALTH INSURANCE | Admitting: Cardiology

## 2015-05-11 VITALS — BP 130/70 | HR 76 | Ht 64.0 in | Wt 209.2 lb

## 2015-05-11 DIAGNOSIS — Q244 Congenital subaortic stenosis: Secondary | ICD-10-CM | POA: Diagnosis not present

## 2015-05-11 DIAGNOSIS — Z954 Presence of other heart-valve replacement: Secondary | ICD-10-CM

## 2015-05-11 DIAGNOSIS — Z952 Presence of prosthetic heart valve: Secondary | ICD-10-CM

## 2015-05-11 DIAGNOSIS — I351 Nonrheumatic aortic (valve) insufficiency: Secondary | ICD-10-CM | POA: Diagnosis not present

## 2015-05-11 DIAGNOSIS — I493 Ventricular premature depolarization: Secondary | ICD-10-CM | POA: Diagnosis not present

## 2015-05-11 DIAGNOSIS — Z5181 Encounter for therapeutic drug level monitoring: Secondary | ICD-10-CM

## 2015-05-11 LAB — POCT INR: INR: 1.6

## 2015-05-11 MED ORDER — ASPIRIN EC 81 MG PO TBEC: 81.0000 mg | DELAYED_RELEASE_TABLET | Freq: Every day | ORAL | Status: AC

## 2015-05-11 NOTE — Patient Instructions (Signed)
Medication Instructions:  Your physician has recommended you make the following change in your medication: 1) START ASPIRIN 81 mg daily  Labwork: None  Testing/Procedures: None  Follow-Up: Your physician wants you to follow-up in: 6 months with Dr. Radford Pax. You will receive a reminder letter in the mail two months in advance. If you don't receive a letter, please call our office to schedule the follow-up appointment.   Any Other Special Instructions Will Be Listed Below (If Applicable).     If you need a refill on your cardiac medications before your next appointment, please call your pharmacy.

## 2015-05-13 ENCOUNTER — Encounter: Payer: Self-pay | Admitting: Family Medicine

## 2015-05-14 ENCOUNTER — Other Ambulatory Visit: Payer: Self-pay | Admitting: Family Medicine

## 2015-05-14 MED ORDER — BUPROPION HCL ER (XL) 300 MG PO TB24: 300.0000 mg | ORAL_TABLET | Freq: Every day | ORAL | Status: DC

## 2015-05-25 ENCOUNTER — Ambulatory Visit (INDEPENDENT_AMBULATORY_CARE_PROVIDER_SITE_OTHER): Payer: PRIVATE HEALTH INSURANCE | Admitting: *Deleted

## 2015-05-25 DIAGNOSIS — Z952 Presence of prosthetic heart valve: Secondary | ICD-10-CM

## 2015-05-25 DIAGNOSIS — Z954 Presence of other heart-valve replacement: Secondary | ICD-10-CM | POA: Diagnosis not present

## 2015-05-25 DIAGNOSIS — Z5181 Encounter for therapeutic drug level monitoring: Secondary | ICD-10-CM

## 2015-05-25 DIAGNOSIS — I351 Nonrheumatic aortic (valve) insufficiency: Secondary | ICD-10-CM

## 2015-05-25 LAB — POCT INR: INR: 2

## 2015-06-15 ENCOUNTER — Ambulatory Visit (INDEPENDENT_AMBULATORY_CARE_PROVIDER_SITE_OTHER): Payer: PRIVATE HEALTH INSURANCE | Admitting: *Deleted

## 2015-06-15 DIAGNOSIS — Z5181 Encounter for therapeutic drug level monitoring: Secondary | ICD-10-CM

## 2015-06-15 DIAGNOSIS — Z954 Presence of other heart-valve replacement: Secondary | ICD-10-CM | POA: Diagnosis not present

## 2015-06-15 DIAGNOSIS — Z952 Presence of prosthetic heart valve: Secondary | ICD-10-CM

## 2015-06-15 DIAGNOSIS — I351 Nonrheumatic aortic (valve) insufficiency: Secondary | ICD-10-CM | POA: Diagnosis not present

## 2015-06-15 LAB — POCT INR: INR: 4

## 2015-06-29 ENCOUNTER — Ambulatory Visit (INDEPENDENT_AMBULATORY_CARE_PROVIDER_SITE_OTHER): Payer: PRIVATE HEALTH INSURANCE | Admitting: *Deleted

## 2015-06-29 DIAGNOSIS — I351 Nonrheumatic aortic (valve) insufficiency: Secondary | ICD-10-CM

## 2015-06-29 DIAGNOSIS — Z952 Presence of prosthetic heart valve: Secondary | ICD-10-CM

## 2015-06-29 DIAGNOSIS — Z954 Presence of other heart-valve replacement: Secondary | ICD-10-CM

## 2015-06-29 DIAGNOSIS — Z5181 Encounter for therapeutic drug level monitoring: Secondary | ICD-10-CM

## 2015-06-29 LAB — POCT INR: INR: 3.6

## 2015-06-30 ENCOUNTER — Other Ambulatory Visit (INDEPENDENT_AMBULATORY_CARE_PROVIDER_SITE_OTHER): Payer: PRIVATE HEALTH INSURANCE

## 2015-06-30 ENCOUNTER — Other Ambulatory Visit: Payer: Self-pay | Admitting: Family Medicine

## 2015-06-30 DIAGNOSIS — E785 Hyperlipidemia, unspecified: Secondary | ICD-10-CM

## 2015-06-30 DIAGNOSIS — R002 Palpitations: Secondary | ICD-10-CM | POA: Diagnosis not present

## 2015-06-30 LAB — LIPID PANEL
CHOL/HDL RATIO: 4
CHOLESTEROL: 147 mg/dL (ref 0–200)
HDL: 39.5 mg/dL (ref >39.00)
LDL CALC: 70 mg/dL (ref 0–99)
NONHDL: 107.3
Triglycerides: 188 mg/dL — ABNORMAL HIGH (ref 0.0–149.0)
VLDL: 37.6 mg/dL (ref 0.0–40.0)

## 2015-06-30 LAB — COMPREHENSIVE METABOLIC PANEL
ALK PHOS: 36 U/L — AB (ref 39–117)
ALT: 23 U/L (ref 0–35)
AST: 24 U/L (ref 0–37)
Albumin: 4.2 g/dL (ref 3.5–5.2)
BILIRUBIN TOTAL: 0.7 mg/dL (ref 0.2–1.2)
BUN: 15 mg/dL (ref 6–23)
CO2: 29 meq/L (ref 19–32)
CREATININE: 0.86 mg/dL (ref 0.40–1.20)
Calcium: 9.7 mg/dL (ref 8.4–10.5)
Chloride: 103 mEq/L (ref 96–112)
GFR: 75.57 mL/min (ref >60.00)
GLUCOSE: 114 mg/dL — AB (ref 70–99)
Potassium: 3.8 mEq/L (ref 3.5–5.1)
SODIUM: 139 meq/L (ref 135–145)
TOTAL PROTEIN: 7.3 g/dL (ref 6.0–8.3)

## 2015-06-30 LAB — CBC
HEMATOCRIT: 36.9 % (ref 36.0–46.0)
HEMOGLOBIN: 12.6 g/dL (ref 12.0–15.0)
MCHC: 34.1 g/dL (ref 30.0–36.0)
MCV: 91.8 fl (ref 78.0–100.0)
Platelets: 253 10*3/uL (ref 150.0–400.0)
RBC: 4.02 Mil/uL (ref 3.87–5.11)
RDW: 14.3 % (ref 11.5–15.5)
WBC: 6.1 10*3/uL (ref 4.0–10.5)

## 2015-06-30 LAB — TSH: TSH: 2.73 u[IU]/mL (ref 0.35–4.50)

## 2015-06-30 MED ORDER — AMPHETAMINE-DEXTROAMPHETAMINE 20 MG PO TABS: 20.0000 mg | ORAL_TABLET | Freq: Two times a day (BID) | ORAL | Status: DC

## 2015-06-30 NOTE — Telephone Encounter (Signed)
Printed adderall patient informed on mychart to pickup hardcopy.

## 2015-07-06 MED FILL — DEXTROAMP-AMPHETAMIN 20 MG: 20 | 30 days supply | Qty: 60 | Fill #0

## 2015-07-07 ENCOUNTER — Ambulatory Visit: Payer: PRIVATE HEALTH INSURANCE | Admitting: Family Medicine

## 2015-07-13 ENCOUNTER — Ambulatory Visit (INDEPENDENT_AMBULATORY_CARE_PROVIDER_SITE_OTHER): Payer: PRIVATE HEALTH INSURANCE | Admitting: *Deleted

## 2015-07-13 DIAGNOSIS — I351 Nonrheumatic aortic (valve) insufficiency: Secondary | ICD-10-CM | POA: Diagnosis not present

## 2015-07-13 DIAGNOSIS — Z954 Presence of other heart-valve replacement: Secondary | ICD-10-CM | POA: Diagnosis not present

## 2015-07-13 DIAGNOSIS — Z5181 Encounter for therapeutic drug level monitoring: Secondary | ICD-10-CM

## 2015-07-13 DIAGNOSIS — Z952 Presence of prosthetic heart valve: Secondary | ICD-10-CM

## 2015-07-13 LAB — POCT INR: INR: 2.9

## 2015-07-21 ENCOUNTER — Encounter: Payer: Self-pay | Admitting: Family Medicine

## 2015-07-21 ENCOUNTER — Ambulatory Visit (INDEPENDENT_AMBULATORY_CARE_PROVIDER_SITE_OTHER): Payer: PRIVATE HEALTH INSURANCE | Admitting: Family Medicine

## 2015-07-21 VITALS — BP 130/86 | HR 76 | Temp 98.8°F | Ht 64.0 in | Wt 195.2 lb

## 2015-07-21 DIAGNOSIS — Z0000 Encounter for general adult medical examination without abnormal findings: Secondary | ICD-10-CM

## 2015-07-21 DIAGNOSIS — Z952 Presence of prosthetic heart valve: Secondary | ICD-10-CM

## 2015-07-21 DIAGNOSIS — R350 Frequency of micturition: Secondary | ICD-10-CM

## 2015-07-21 DIAGNOSIS — E785 Hyperlipidemia, unspecified: Secondary | ICD-10-CM | POA: Diagnosis not present

## 2015-07-21 DIAGNOSIS — F909 Attention-deficit hyperactivity disorder, unspecified type: Secondary | ICD-10-CM

## 2015-07-21 DIAGNOSIS — F988 Other specified behavioral and emotional disorders with onset usually occurring in childhood and adolescence: Secondary | ICD-10-CM

## 2015-07-21 DIAGNOSIS — R739 Hyperglycemia, unspecified: Secondary | ICD-10-CM

## 2015-07-21 DIAGNOSIS — F419 Anxiety disorder, unspecified: Secondary | ICD-10-CM

## 2015-07-21 DIAGNOSIS — F32A Depression, unspecified: Secondary | ICD-10-CM

## 2015-07-21 DIAGNOSIS — Z954 Presence of other heart-valve replacement: Secondary | ICD-10-CM

## 2015-07-21 DIAGNOSIS — F329 Major depressive disorder, single episode, unspecified: Secondary | ICD-10-CM

## 2015-07-21 DIAGNOSIS — F418 Other specified anxiety disorders: Secondary | ICD-10-CM

## 2015-07-21 MED ORDER — AMPHETAMINE-DEXTROAMPHETAMINE 20 MG PO TABS: 20.0000 mg | ORAL_TABLET | Freq: Two times a day (BID) | ORAL | Status: DC

## 2015-07-21 MED ORDER — BUPROPION HCL ER (XL) 300 MG PO TB24: 300.0000 mg | ORAL_TABLET | Freq: Every day | ORAL | Status: DC

## 2015-07-21 NOTE — Assessment & Plan Note (Signed)
Tolerating Adderall 
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
## 2015-07-21 NOTE — Progress Notes (Signed)
Pre visit review using our clinic review tool, if applicable. No additional management support is needed unless otherwise documented below in the visit note. 

## 2015-07-21 NOTE — Assessment & Plan Note (Signed)
Encouraged DASH diet, decrease po intake and increase exercise as tolerated. Needs 7-8 hours of sleep nightly. Avoid trans fats, eat small, frequent meals every 4-5 hours with lean proteins, complex carbs and healthy fats. Minimize simple carbs 

## 2015-07-21 NOTE — Assessment & Plan Note (Signed)
Doing well, following with cardiology, asymptomatic

## 2015-07-21 NOTE — Assessment & Plan Note (Signed)
Tolerating statin, encouraged heart healthy diet, avoid trans fats, minimize simple carbs and saturated fats. Increase exercise as tolerated 

## 2015-07-21 NOTE — Assessment & Plan Note (Signed)
Check urinalysis and cultures.

## 2015-07-21 NOTE — Assessment & Plan Note (Signed)
Doing well on current meds. No new concerns.

## 2015-07-21 NOTE — Patient Instructions (Signed)

## 2015-07-28 ENCOUNTER — Other Ambulatory Visit (INDEPENDENT_AMBULATORY_CARE_PROVIDER_SITE_OTHER): Payer: PRIVATE HEALTH INSURANCE

## 2015-07-28 DIAGNOSIS — Z0000 Encounter for general adult medical examination without abnormal findings: Secondary | ICD-10-CM

## 2015-07-28 DIAGNOSIS — R350 Frequency of micturition: Secondary | ICD-10-CM

## 2015-07-28 DIAGNOSIS — E785 Hyperlipidemia, unspecified: Secondary | ICD-10-CM

## 2015-07-28 DIAGNOSIS — R7989 Other specified abnormal findings of blood chemistry: Secondary | ICD-10-CM | POA: Diagnosis not present

## 2015-07-28 DIAGNOSIS — R739 Hyperglycemia, unspecified: Secondary | ICD-10-CM

## 2015-07-28 LAB — COMPREHENSIVE METABOLIC PANEL
ALT: 25 U/L (ref 0–35)
AST: 25 U/L (ref 0–37)
Albumin: 4.2 g/dL (ref 3.5–5.2)
Alkaline Phosphatase: 35 U/L — ABNORMAL LOW (ref 39–117)
BUN: 13 mg/dL (ref 6–23)
CALCIUM: 9.6 mg/dL (ref 8.4–10.5)
CHLORIDE: 103 meq/L (ref 96–112)
CO2: 26 meq/L (ref 19–32)
Creatinine, Ser: 0.87 mg/dL (ref 0.40–1.20)
GFR: 74.55 mL/min (ref >60.00)
GLUCOSE: 122 mg/dL — AB (ref 70–99)
POTASSIUM: 3.7 meq/L (ref 3.5–5.1)
Sodium: 137 mEq/L (ref 135–145)
Total Bilirubin: 0.5 mg/dL (ref 0.2–1.2)
Total Protein: 6.8 g/dL (ref 6.0–8.3)

## 2015-07-28 LAB — LIPID PANEL
CHOLESTEROL: 171 mg/dL (ref 0–200)
HDL: 32.2 mg/dL — AB (ref >39.00)
NONHDL: 138.51
TRIGLYCERIDES: 276 mg/dL — AB (ref 0.0–149.0)
Total CHOL/HDL Ratio: 5
VLDL: 55.2 mg/dL — ABNORMAL HIGH (ref 0.0–40.0)

## 2015-07-28 LAB — URINALYSIS
Bilirubin Urine: NEGATIVE
Hgb urine dipstick: NEGATIVE
Ketones, ur: NEGATIVE
LEUKOCYTES UA: NEGATIVE
NITRITE: NEGATIVE
PH: 5.5 (ref 5.0–8.0)
SPECIFIC GRAVITY, URINE: 1.02 (ref 1.000–1.030)
Total Protein, Urine: NEGATIVE
Urine Glucose: NEGATIVE
Urobilinogen, UA: 0.2 (ref 0.0–1.0)

## 2015-07-28 LAB — CBC
HCT: 38.3 % (ref 36.0–46.0)
HEMOGLOBIN: 12.8 g/dL (ref 12.0–15.0)
MCHC: 33.4 g/dL (ref 30.0–36.0)
MCV: 95 fl (ref 78.0–100.0)
Platelets: 251 10*3/uL (ref 150.0–400.0)
RBC: 4.03 Mil/uL (ref 3.87–5.11)
RDW: 14.3 % (ref 11.5–15.5)
WBC: 7.4 10*3/uL (ref 4.0–10.5)

## 2015-07-28 LAB — TSH: TSH: 2.81 u[IU]/mL (ref 0.35–4.50)

## 2015-07-28 LAB — HEMOGLOBIN A1C: HEMOGLOBIN A1C: 5.7 % (ref 4.6–6.5)

## 2015-07-28 LAB — LDL CHOLESTEROL, DIRECT: Direct LDL: 93 mg/dL

## 2015-07-30 LAB — URINE CULTURE
COLONY COUNT: NO GROWTH
Organism ID, Bacteria: NO GROWTH

## 2015-07-31 ENCOUNTER — Telehealth: Payer: Self-pay | Admitting: Family Medicine

## 2015-07-31 ENCOUNTER — Other Ambulatory Visit: Payer: Self-pay | Admitting: Family Medicine

## 2015-07-31 MED ORDER — ATORVASTATIN CALCIUM 20 MG PO TABS: ORAL_TABLET | ORAL | Status: DC

## 2015-07-31 NOTE — Telephone Encounter (Signed)
Patient informed of lab results. 

## 2015-07-31 NOTE — Telephone Encounter (Signed)
°  Relation to [id number] Call back number:[phone] Pharmacy:  Reason for call: pt is returning your call regarding lab results, states her vm is private so you can leave her a message if she does not answer.

## 2015-08-05 ENCOUNTER — Ambulatory Visit (INDEPENDENT_AMBULATORY_CARE_PROVIDER_SITE_OTHER): Payer: PRIVATE HEALTH INSURANCE | Admitting: *Deleted

## 2015-08-05 DIAGNOSIS — I359 Nonrheumatic aortic valve disorder, unspecified: Secondary | ICD-10-CM

## 2015-08-05 DIAGNOSIS — I351 Nonrheumatic aortic (valve) insufficiency: Secondary | ICD-10-CM

## 2015-08-05 DIAGNOSIS — Z954 Presence of other heart-valve replacement: Secondary | ICD-10-CM | POA: Diagnosis not present

## 2015-08-05 DIAGNOSIS — Z952 Presence of prosthetic heart valve: Secondary | ICD-10-CM

## 2015-08-05 DIAGNOSIS — Z5181 Encounter for therapeutic drug level monitoring: Secondary | ICD-10-CM

## 2015-08-05 LAB — POCT INR: INR: 2.3

## 2015-08-05 MED ORDER — WARFARIN SODIUM 5 MG PO TABS: ORAL_TABLET | ORAL | Status: DC

## 2015-08-24 ENCOUNTER — Other Ambulatory Visit: Payer: Self-pay | Admitting: Family Medicine

## 2015-08-24 ENCOUNTER — Encounter: Payer: Self-pay | Admitting: Family Medicine

## 2015-08-24 DIAGNOSIS — Z0000 Encounter for general adult medical examination without abnormal findings: Secondary | ICD-10-CM

## 2015-08-24 DIAGNOSIS — E785 Hyperlipidemia, unspecified: Secondary | ICD-10-CM

## 2015-08-24 DIAGNOSIS — R739 Hyperglycemia, unspecified: Secondary | ICD-10-CM

## 2015-08-24 MED ORDER — BUPROPION HCL ER (XL) 300 MG PO TB24: 300.0000 mg | ORAL_TABLET | Freq: Every day | ORAL | Status: DC

## 2015-08-26 ENCOUNTER — Ambulatory Visit (INDEPENDENT_AMBULATORY_CARE_PROVIDER_SITE_OTHER): Payer: PRIVATE HEALTH INSURANCE | Admitting: *Deleted

## 2015-08-26 DIAGNOSIS — Z5181 Encounter for therapeutic drug level monitoring: Secondary | ICD-10-CM

## 2015-08-26 DIAGNOSIS — Z954 Presence of other heart-valve replacement: Secondary | ICD-10-CM | POA: Diagnosis not present

## 2015-08-26 DIAGNOSIS — I351 Nonrheumatic aortic (valve) insufficiency: Secondary | ICD-10-CM | POA: Diagnosis not present

## 2015-08-26 DIAGNOSIS — Z952 Presence of prosthetic heart valve: Secondary | ICD-10-CM

## 2015-08-26 LAB — POCT INR: INR: 2.6

## 2015-09-23 ENCOUNTER — Ambulatory Visit (INDEPENDENT_AMBULATORY_CARE_PROVIDER_SITE_OTHER): Payer: PRIVATE HEALTH INSURANCE | Admitting: Pharmacist

## 2015-09-23 DIAGNOSIS — I351 Nonrheumatic aortic (valve) insufficiency: Secondary | ICD-10-CM | POA: Diagnosis not present

## 2015-09-23 DIAGNOSIS — Z954 Presence of other heart-valve replacement: Secondary | ICD-10-CM

## 2015-09-23 DIAGNOSIS — Z5181 Encounter for therapeutic drug level monitoring: Secondary | ICD-10-CM

## 2015-09-23 DIAGNOSIS — Z952 Presence of prosthetic heart valve: Secondary | ICD-10-CM

## 2015-09-23 LAB — POCT INR: INR: 2.3

## 2015-10-14 ENCOUNTER — Encounter: Payer: Self-pay | Admitting: Cardiology

## 2015-10-20 ENCOUNTER — Encounter: Payer: Self-pay | Admitting: Family Medicine

## 2015-10-21 ENCOUNTER — Telehealth: Payer: Self-pay | Admitting: Family Medicine

## 2015-10-21 DIAGNOSIS — E785 Hyperlipidemia, unspecified: Secondary | ICD-10-CM

## 2015-10-21 DIAGNOSIS — Z0000 Encounter for general adult medical examination without abnormal findings: Secondary | ICD-10-CM

## 2015-10-21 DIAGNOSIS — R739 Hyperglycemia, unspecified: Secondary | ICD-10-CM

## 2015-10-21 NOTE — Telephone Encounter (Signed)
Refill request for Adderall 20 mg BID Last filled by MD on - 07/21/15 [May, June & July] Last AEX - 07/21/15 Next AEX - 4-Mths Please Advise on refills/SLS 08/09

## 2015-10-21 NOTE — Telephone Encounter (Signed)
OK to write Adderall, August, September, October

## 2015-10-21 NOTE — Telephone Encounter (Signed)
Relation to [id number] Call back number:[phone]   Reason for call:  Patient LVM requesting a 1 month supply amphetamine-dextroamphetamine (ADDERALL) 20 MG tablet

## 2015-10-22 MED ORDER — AMPHETAMINE-DEXTROAMPHETAMINE 20 MG PO TABS: 20.0000 mg | ORAL_TABLET | Freq: Two times a day (BID) | ORAL | 0 refills | Status: DC

## 2015-10-22 NOTE — Telephone Encounter (Signed)
Printed and called the patient left a message hardcopy's are ready for pickup at the [place] desk.

## 2015-10-23 ENCOUNTER — Ambulatory Visit: Payer: PRIVATE HEALTH INSURANCE | Admitting: Family Medicine

## 2015-10-29 ENCOUNTER — Ambulatory Visit: Payer: PRIVATE HEALTH INSURANCE | Admitting: Cardiology

## 2015-11-03 ENCOUNTER — Encounter: Payer: Self-pay | Admitting: Cardiology

## 2015-11-03 DIAGNOSIS — I493 Ventricular premature depolarization: Secondary | ICD-10-CM

## 2015-11-03 HISTORY — DX: Ventricular premature depolarization: I49.3
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
## 2015-11-04 ENCOUNTER — Ambulatory Visit (INDEPENDENT_AMBULATORY_CARE_PROVIDER_SITE_OTHER): Payer: PRIVATE HEALTH INSURANCE | Admitting: Cardiology

## 2015-11-04 ENCOUNTER — Ambulatory Visit (INDEPENDENT_AMBULATORY_CARE_PROVIDER_SITE_OTHER): Payer: PRIVATE HEALTH INSURANCE | Admitting: *Deleted

## 2015-11-04 ENCOUNTER — Encounter: Payer: Self-pay | Admitting: Cardiology

## 2015-11-04 VITALS — BP 124/86 | HR 65 | Ht 64.0 in | Wt 194.4 lb

## 2015-11-04 DIAGNOSIS — Z5181 Encounter for therapeutic drug level monitoring: Secondary | ICD-10-CM | POA: Diagnosis not present

## 2015-11-04 DIAGNOSIS — Z952 Presence of prosthetic heart valve: Secondary | ICD-10-CM

## 2015-11-04 DIAGNOSIS — I351 Nonrheumatic aortic (valve) insufficiency: Secondary | ICD-10-CM

## 2015-11-04 DIAGNOSIS — Q244 Congenital subaortic stenosis: Secondary | ICD-10-CM

## 2015-11-04 DIAGNOSIS — E785 Hyperlipidemia, unspecified: Secondary | ICD-10-CM

## 2015-11-04 DIAGNOSIS — Z954 Presence of other heart-valve replacement: Secondary | ICD-10-CM

## 2015-11-04 DIAGNOSIS — I493 Ventricular premature depolarization: Secondary | ICD-10-CM | POA: Diagnosis not present

## 2015-11-04 LAB — POCT INR: INR: 2.4

## 2015-11-04 NOTE — Patient Instructions (Signed)

## 2015-11-13 ENCOUNTER — Other Ambulatory Visit (HOSPITAL_COMMUNITY): Payer: PRIVATE HEALTH INSURANCE

## 2015-11-17 ENCOUNTER — Other Ambulatory Visit: Payer: Self-pay | Admitting: *Deleted

## 2015-11-17 MED ORDER — WARFARIN SODIUM 5 MG PO TABS: ORAL_TABLET | ORAL | 0 refills | Status: DC

## 2015-11-20 ENCOUNTER — Ambulatory Visit (HOSPITAL_COMMUNITY): Payer: PRIVATE HEALTH INSURANCE | Attending: Cardiovascular Disease

## 2015-11-20 ENCOUNTER — Other Ambulatory Visit: Payer: Self-pay

## 2015-11-20 DIAGNOSIS — I351 Nonrheumatic aortic (valve) insufficiency: Secondary | ICD-10-CM | POA: Diagnosis not present

## 2015-11-20 DIAGNOSIS — I517 Cardiomegaly: Secondary | ICD-10-CM | POA: Insufficient documentation

## 2015-11-20 DIAGNOSIS — E669 Obesity, unspecified: Secondary | ICD-10-CM | POA: Diagnosis not present

## 2015-11-20 DIAGNOSIS — Q244 Congenital subaortic stenosis: Secondary | ICD-10-CM

## 2015-11-20 DIAGNOSIS — Z6833 Body mass index (BMI) 33.0-33.9, adult: Secondary | ICD-10-CM | POA: Diagnosis not present

## 2015-11-20 DIAGNOSIS — Z953 Presence of xenogenic heart valve: Secondary | ICD-10-CM | POA: Insufficient documentation

## 2015-11-20 DIAGNOSIS — E785 Hyperlipidemia, unspecified: Secondary | ICD-10-CM | POA: Insufficient documentation

## 2015-11-20 DIAGNOSIS — Z8249 Family history of ischemic heart disease and other diseases of the circulatory system: Secondary | ICD-10-CM | POA: Diagnosis not present

## 2015-11-27 ENCOUNTER — Encounter: Payer: Self-pay | Admitting: Cardiology

## 2015-11-27 ENCOUNTER — Telehealth: Payer: Self-pay | Admitting: Cardiology

## 2015-11-27 ENCOUNTER — Encounter: Payer: Self-pay | Admitting: Surgery

## 2015-11-27 DIAGNOSIS — I351 Nonrheumatic aortic (valve) insufficiency: Secondary | ICD-10-CM

## 2015-11-27 DIAGNOSIS — Q244 Congenital subaortic stenosis: Secondary | ICD-10-CM

## 2015-11-27 NOTE — Telephone Encounter (Signed)
Notes Recorded by Sueanne Margarita, MD on 11/24/2015 at 8:31 PM EDT Echo showed normal LVF with increased peak velocity of 358 cm/s and mean AV gradient of 42mmHg. This is most likely secondary to prosthetic valve mismatch. Please repeat echo in 6 months to assure stability.

## 2015-11-27 NOTE — Telephone Encounter (Signed)
New message ° ° ° ° ° ° °Pt returning nurse call  °

## 2015-11-27 NOTE — Telephone Encounter (Signed)
Spoke with patient about echo results done 11/20/15

## 2015-11-27 NOTE — Telephone Encounter (Signed)
See phone note 11/27/15

## 2015-11-27 NOTE — Telephone Encounter (Signed)
New message ° ° ° ° ° °Returning a call to get echo results °

## 2015-11-27 NOTE — Telephone Encounter (Signed)
LMTCB at both numbers listed

## 2015-12-14 ENCOUNTER — Other Ambulatory Visit: Payer: Self-pay | Admitting: Family Medicine

## 2015-12-14 DIAGNOSIS — Z1231 Encounter for screening mammogram for malignant neoplasm of breast: Secondary | ICD-10-CM

## 2015-12-16 ENCOUNTER — Ambulatory Visit (INDEPENDENT_AMBULATORY_CARE_PROVIDER_SITE_OTHER): Payer: PRIVATE HEALTH INSURANCE | Admitting: *Deleted

## 2015-12-16 DIAGNOSIS — Z952 Presence of prosthetic heart valve: Secondary | ICD-10-CM | POA: Diagnosis not present

## 2015-12-16 DIAGNOSIS — Z5181 Encounter for therapeutic drug level monitoring: Secondary | ICD-10-CM | POA: Diagnosis not present

## 2015-12-16 DIAGNOSIS — I351 Nonrheumatic aortic (valve) insufficiency: Secondary | ICD-10-CM

## 2015-12-16 LAB — POCT INR: INR: 2.1

## 2015-12-23 ENCOUNTER — Ambulatory Visit
Admission: RE | Admit: 2015-12-23 | Discharge: 2015-12-23 | Disposition: A | Discharge status: Home / Self Care | Payer: PRIVATE HEALTH INSURANCE | Source: Ambulatory Visit | Attending: Family Medicine | Admitting: Family Medicine

## 2015-12-23 DIAGNOSIS — Z1231 Encounter for screening mammogram for malignant neoplasm of breast: Secondary | ICD-10-CM

## 2015-12-25 ENCOUNTER — Encounter: Payer: Self-pay | Admitting: Women's Health

## 2016-01-21 ENCOUNTER — Ambulatory Visit (INDEPENDENT_AMBULATORY_CARE_PROVIDER_SITE_OTHER): Payer: PRIVATE HEALTH INSURANCE | Admitting: Family Medicine

## 2016-01-21 ENCOUNTER — Encounter: Payer: Self-pay | Admitting: Family Medicine

## 2016-01-21 DIAGNOSIS — Z952 Presence of prosthetic heart valve: Secondary | ICD-10-CM

## 2016-01-21 DIAGNOSIS — K219 Gastro-esophageal reflux disease without esophagitis: Secondary | ICD-10-CM

## 2016-01-21 DIAGNOSIS — F988 Other specified behavioral and emotional disorders with onset usually occurring in childhood and adolescence: Secondary | ICD-10-CM

## 2016-01-21 DIAGNOSIS — E6609 Other obesity due to excess calories: Secondary | ICD-10-CM

## 2016-01-21 DIAGNOSIS — Z0000 Encounter for general adult medical examination without abnormal findings: Secondary | ICD-10-CM

## 2016-01-21 DIAGNOSIS — R739 Hyperglycemia, unspecified: Secondary | ICD-10-CM

## 2016-01-21 DIAGNOSIS — M542 Cervicalgia: Secondary | ICD-10-CM

## 2016-01-21 DIAGNOSIS — E782 Mixed hyperlipidemia: Secondary | ICD-10-CM | POA: Diagnosis not present

## 2016-01-21 MED ORDER — AMPHETAMINE-DEXTROAMPHETAMINE 20 MG PO TABS: 20.0000 mg | ORAL_TABLET | Freq: Three times a day (TID) | ORAL | 0 refills | Status: DC | PRN

## 2016-01-21 MED ORDER — TIZANIDINE HCL 4 MG PO TABS
4.0000 mg | ORAL_TABLET | Freq: Two times a day (BID) | ORAL | 2 refills | Status: DC | PRN
Start: 2016-01-21 — End: 2016-11-18

## 2016-01-21 MED ORDER — AMPHETAMINE-DEXTROAMPHETAMINE 20 MG PO TABS: 20.0000 mg | ORAL_TABLET | Freq: Two times a day (BID) | ORAL | 0 refills | Status: DC

## 2016-01-21 NOTE — Patient Instructions (Signed)
Thoracic Outlet Syndrome  Thoracic outlet syndrome (TOS) is a group of signs and symptoms that result when the vein, artery, or nerves that supply your arm and hand are squeezed (compressed). To reach your arm, all of these have to pass through a tight space under your collarbone and above your top rib (thoracic outlet).  There are three types of TOS:   Compression of the nerves that supply your arm and hand is called neurogenic TOS. Most people with TOS have this type.   Compression of the vein that returns blood from your arm and hand (subclavian vein) is called venous TOS.   Compression of the artery that carries blood to your arm and hand (subclavian artery) is called arterial TOS. Arterial TOS is the rarest type.  Depending on which structures are affected, you may have symptoms on one side or both sides of your body.  CAUSES   Neurogenic TOS may be caused by swelling or scarring in your neck muscles that results in the narrowing of your thoracic outlet. This leads to nerve compression. It can happen from:    Neck injuries from an auto accident (whiplash).    Falls.    Repetitive stress on your neck from working with your arms. This stress could be from using a keyboard all day or working on an assembly line.   Venous TOS may be caused by doing hard work with your arms, especially if you have to lift your arms above your head. A blood clot may form in the vein.   Arterial TOS may be caused by having an extra rib at the base of your neck (cervical rib). This rib presses on your subclavian artery. Over time, this pressure may cause a clot to form inside the artery, or the artery may weaken and balloon outward (aneurysm).  RISK FACTORS   You may be at greater risk for neurogenic TOS after a neck injury or repetitive stress on your neck.   You may be at greater risk for venous TOS if you do strenuous and repetitive work with your arms.   You may be at greater risk for arterial TOS if you were born with a  cervical rib.  Risk factors for any type of TOS include:   Being female.   Being overweight.   Having poor posture.  SIGNS AND SYMPTOMS   Your signs and symptoms will depend on the type of TOS that you have.  Signs and symptoms of neurogenic TOS may include:   Pain in your shoulder, arm, or hand.   Tingling or numbness in your shoulder, arm, or hand.   Tiredness or weakness of your shoulder, arm, or hand.   Neck pain.   Headache.  Signs and symptoms of venous TOS may include:   Pain and swelling of your whole arm.   Arm skin that is darker than usual.  Signs and symptoms of arterial TOS may include:   Pain and cramps in your arm or hand.   Pale arm skin.   Very cold hands.  All signs and symptoms of TOS may be worse when you hold your arms over your head.  DIAGNOSIS  Your health care provider may suspect TOS from your symptoms. A physical exam will be done. During the exam, your health care provider may ask you to hold your arms over your head to check whether your symptoms get worse. Tests may also be done to confirm the diagnosis and to find out what is causing TOS.   These may include:   Imaging studies, such as:    X-rays to look for a cervical rib.    A test using sound waves to create an image (ultrasound).    CT scan.    MRI.   A test that involves measuring and recording the pulses in your wrists (pulse volume recording).   A test that involves measuring the conduction speed of nerve impulses in your arm (nerve conduction velocity test).   A test in which X-rays are done after dye is injected into your subclavian artery or vein (venography or arteriography).  TREATMENT   Treatment depends on the type of TOS that you have.   Neurogenic TOS may be treated with:    Physical therapy to learn stretching exercises and good posture.    Occupational therapy to improve your workplace and home environment.    Medicine, including pain medicine, muscle relaxants, and anti-inflammatory medicine.     Surgery to remove scarred neck muscles or the first rib. This is rarely done for this type of TOS.   Venous TOS may be treated with:    Medicine, including blood thinners or blood clot dissolvers.    Surgery to remove a blood clot.    Surgery to remove the uppermost rib to make more space in the thoracic outlet.   Arterial TOS may be treated with surgery to:    Remove the cervical rib.    Remove a blood clot (thrombus).    Repair an aneurysm.  HOME CARE INSTRUCTIONS   Take medicines only as directed by your health care provider.   Maintain a healthy weight. Lose weight as directed by your health care provider.   Do stretching exercises at home as directed by your health care provider or physical therapist.   Maintain good posture.   Do not carry heavy bags over your shoulder.   Do not repetitively lift heavy objects over your head.   Take frequent breaks to stretch and rest your arms if you work at a keyboard or do other repetitive work with your hands and arms.   Keep all follow-up visits as directed by your health care provider. This is important.  SEEK MEDICAL CARE IF:   You have pain, cramps, numbness, or tingling in your arm or hand.   Your arm or hand frequently feels tired.   Your arm develops a darker skin color than usual.   Your hand feels cold.   You have frequent headaches or neckaches.  SEEK IMMEDIATE MEDICAL CARE IF:    You lose feeling in your arm or hand.   You are unable to move your fingers.   Your fingers turn a dark color.     This information is not intended to replace advice given to you by your health care provider. Make sure you discuss any questions you have with your health care provider.     Document Released: 02/18/2002 Document Revised: 03/21/2014 Document Reviewed: 07/31/2013  Elsevier Interactive Patient Education 2016 [employer]

## 2016-01-21 NOTE — Progress Notes (Signed)
Pre visit review using our clinic review tool, if applicable. No additional management support is needed unless otherwise documented below in the visit note. 
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
## 2016-01-24 NOTE — Assessment & Plan Note (Signed)
Encouraged moist heat and gentle stretching as tolerated. May try NSAIDs and prescription meds as directed and report if symptoms worsen or seek immediate care 

## 2016-01-24 NOTE — Assessment & Plan Note (Signed)
Having to work 2 jobs and struggling to maintain focus. Will allow increase of Adderall to tid.

## 2016-01-24 NOTE — Assessment & Plan Note (Signed)
Avoid offending foods, start probiotics. Do not eat large meals in late evening and consider raising head of bed.  

## 2016-01-24 NOTE — Assessment & Plan Note (Signed)
Encouraged DASH diet, decrease po intake and increase exercise as tolerated. Needs 7-8 hours of sleep nightly. Avoid trans fats, eat small, frequent meals every 4-5 hours with lean proteins, complex carbs and healthy fats. Minimize simple carbs 

## 2016-01-24 NOTE — Assessment & Plan Note (Signed)
Tolerating Coumadin 

## 2016-01-27 ENCOUNTER — Ambulatory Visit (INDEPENDENT_AMBULATORY_CARE_PROVIDER_SITE_OTHER): Payer: PRIVATE HEALTH INSURANCE | Admitting: *Deleted

## 2016-01-27 DIAGNOSIS — Z952 Presence of prosthetic heart valve: Secondary | ICD-10-CM | POA: Diagnosis not present

## 2016-01-27 DIAGNOSIS — Z5181 Encounter for therapeutic drug level monitoring: Secondary | ICD-10-CM

## 2016-01-27 DIAGNOSIS — I351 Nonrheumatic aortic (valve) insufficiency: Secondary | ICD-10-CM | POA: Diagnosis not present

## 2016-01-27 LAB — POCT INR: INR: 2.1

## 2016-02-07 ENCOUNTER — Other Ambulatory Visit: Payer: Self-pay | Admitting: Family Medicine

## 2016-02-26 ENCOUNTER — Other Ambulatory Visit: Payer: Self-pay | Admitting: Surgical

## 2016-02-29 ENCOUNTER — Other Ambulatory Visit: Payer: Self-pay | Admitting: *Deleted

## 2016-02-29 MED ORDER — WARFARIN SODIUM 5 MG PO TABS: ORAL_TABLET | ORAL | 1 refills | Status: DC

## 2016-03-09 ENCOUNTER — Ambulatory Visit (INDEPENDENT_AMBULATORY_CARE_PROVIDER_SITE_OTHER): Payer: PRIVATE HEALTH INSURANCE | Admitting: *Deleted

## 2016-03-09 DIAGNOSIS — Z952 Presence of prosthetic heart valve: Secondary | ICD-10-CM

## 2016-03-09 DIAGNOSIS — I351 Nonrheumatic aortic (valve) insufficiency: Secondary | ICD-10-CM

## 2016-03-09 DIAGNOSIS — Z5181 Encounter for therapeutic drug level monitoring: Secondary | ICD-10-CM | POA: Diagnosis not present

## 2016-03-09 LAB — POCT INR: INR: 2.4

## 2016-03-17 ENCOUNTER — Encounter: Payer: PRIVATE HEALTH INSURANCE | Admitting: Women's Health

## 2016-03-22 ENCOUNTER — Encounter: Payer: Self-pay | Admitting: Women's Health

## 2016-03-22 ENCOUNTER — Ambulatory Visit (INDEPENDENT_AMBULATORY_CARE_PROVIDER_SITE_OTHER): Payer: PRIVATE HEALTH INSURANCE | Admitting: Women's Health

## 2016-03-22 VITALS — BP 128/80 | Ht 64.0 in | Wt 189.0 lb

## 2016-03-22 DIAGNOSIS — Z01419 Encounter for gynecological examination (general) (routine) without abnormal findings: Secondary | ICD-10-CM

## 2016-03-22 DIAGNOSIS — R1030 Lower abdominal pain, unspecified: Secondary | ICD-10-CM

## 2016-03-22 DIAGNOSIS — Z113 Encounter for screening for infections with a predominantly sexual mode of transmission: Secondary | ICD-10-CM
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
## 2016-03-22 NOTE — Patient Instructions (Signed)
Health Maintenance, Female Introduction Adopting a healthy lifestyle and getting preventive care can go a long way to promote health and wellness. Talk with your health care provider about what schedule of regular examinations is right for you. This is a good chance for you to check in with your provider about disease prevention and staying healthy. In between checkups, there are plenty of things you can do on your own. Experts have done a lot of research about which lifestyle changes and preventive measures are most likely to keep you healthy. Ask your health care provider for more information. Weight and diet Eat a healthy diet  Be sure to include plenty of vegetables, fruits, low-fat dairy products, and lean protein.  Do not eat a lot of foods high in solid fats, added sugars, or salt.  Get regular exercise. This is one of the most important things you can do for your health.  Most adults should exercise for at least 150 minutes each week. The exercise should increase your heart rate and make you sweat (moderate-intensity exercise).  Most adults should also do strengthening exercises at least twice a week. This is in addition to the moderate-intensity exercise. Maintain a healthy weight  Body mass index (BMI) is a measurement that can be used to identify possible weight problems. It estimates body fat based on height and weight. Your health care provider can help determine your BMI and help you achieve or maintain a healthy weight.  For females 63 years of age and older:  A BMI below 18.5 is considered underweight.  A BMI of 18.5 to 24.9 is normal.  A BMI of 25 to 29.9 is considered overweight.  A BMI of 30 and above is considered obese. Watch levels of cholesterol and blood lipids  You should start having your blood tested for lipids and cholesterol at 47 years of age, then have this test every 5 years.  You may need to have your cholesterol levels checked more often if:  Your  lipid or cholesterol levels are high.  You are older than 47 years of age.  You are at high risk for heart disease. Cancer screening Lung Cancer  Lung cancer screening is recommended for adults 56-22 years old who are at high risk for lung cancer because of a history of smoking.  A yearly low-dose CT scan of the lungs is recommended for people who:  Currently smoke.  Have quit within the past 15 years.  Have at least a 30-pack-year history of smoking. A pack year is smoking an average of one pack of cigarettes a day for 1 year.  Yearly screening should continue until it has been 15 years since you quit.  Yearly screening should stop if you develop a health problem that would prevent you from having lung cancer treatment. Breast Cancer  Practice breast self-awareness. This means understanding how your breasts normally appear and feel.  It also means doing regular breast self-exams. Let your health care provider know about any changes, no matter how small.  If you are in your 20s or 30s, you should have a clinical breast exam (CBE) by a health care provider every 1-3 years as part of a regular health exam.  If you are 35 or older, have a CBE every year. Also consider having a breast X-ray (mammogram) every year.  If you have a family history of breast cancer, talk to your health care provider about genetic screening.  If you are at high risk for breast cancer,  talk to your health care provider about having an MRI and a mammogram every year.  Breast cancer gene (BRCA) assessment is recommended for women who have family members with BRCA-related cancers. BRCA-related cancers include:  Breast.  Ovarian.  Tubal.  Peritoneal cancers.  Results of the assessment will determine the need for genetic counseling and BRCA1 and BRCA2 testing. Cervical Cancer  Your health care provider may recommend that you be screened regularly for cancer of the pelvic organs (ovaries, uterus, and  vagina). This screening involves a pelvic examination, including checking for microscopic changes to the surface of your cervix (Pap test). You may be encouraged to have this screening done every 3 years, beginning at age 21.  For women ages 30-65, health care providers may recommend pelvic exams and Pap testing every 3 years, or they may recommend the Pap and pelvic exam, combined with testing for human papilloma virus (HPV), every 5 years. Some types of HPV increase your risk of cervical cancer. Testing for HPV may also be done on women of any age with unclear Pap test results.  Other health care providers may not recommend any screening for nonpregnant women who are considered low risk for pelvic cancer and who do not have symptoms. Ask your health care provider if a screening pelvic exam is right for you.  If you have had past treatment for cervical cancer or a condition that could lead to cancer, you need Pap tests and screening for cancer for at least 20 years after your treatment. If Pap tests have been discontinued, your risk factors (such as having a new sexual partner) need to be reassessed to determine if screening should resume. Some women have medical problems that increase the chance of getting cervical cancer. In these cases, your health care provider may recommend more frequent screening and Pap tests. Colorectal Cancer  This type of cancer can be detected and often prevented.  Routine colorectal cancer screening usually begins at 47 years of age and continues through 47 years of age.  Your health care provider may recommend screening at an earlier age if you have risk factors for colon cancer.  Your health care provider may also recommend using home test kits to check for hidden blood in the stool.  A small camera at the end of a tube can be used to examine your colon directly (sigmoidoscopy or colonoscopy). This is done to check for the earliest forms of colorectal  cancer.  Routine screening usually begins at age 50.  Direct examination of the colon should be repeated every 5-10 years through 47 years of age. However, you may need to be screened more often if early forms of precancerous polyps or small growths are found. Skin Cancer  Check your skin from head to toe regularly.  Tell your health care provider about any new moles or changes in moles, especially if there is a change in a mole's shape or color.  Also tell your health care provider if you have a mole that is larger than the size of a pencil eraser.  Always use sunscreen. Apply sunscreen liberally and repeatedly throughout the day.  Protect yourself by wearing long sleeves, pants, a wide-brimmed hat, and sunglasses whenever you are outside. Heart disease, diabetes, and high blood pressure  High blood pressure causes heart disease and increases the risk of stroke. High blood pressure is more likely to develop in:  People who have blood pressure in the high end of the normal range (130-139/85-89 mm Hg).    People who are overweight or obese.  People who are African American.  If you are 18-39 years of age, have your blood pressure checked every 3-5 years. If you are 40 years of age or older, have your blood pressure checked every year. You should have your blood pressure measured twice-once when you are at a hospital or clinic, and once when you are not at a hospital or clinic. Record the average of the two measurements. To check your blood pressure when you are not at a hospital or clinic, you can use:  An automated blood pressure machine at a pharmacy.  A home blood pressure monitor.  If you are between 55 years and 79 years old, ask your health care provider if you should take aspirin to prevent strokes.  Have regular diabetes screenings. This involves taking a blood sample to check your fasting blood sugar level.  If you are at a normal weight and have a low risk for diabetes,  have this test once every three years after 47 years of age.  If you are overweight and have a high risk for diabetes, consider being tested at a younger age or more often. Preventing infection Hepatitis B  If you have a higher risk for hepatitis B, you should be screened for this virus. You are considered at high risk for hepatitis B if:  You were born in a country where hepatitis B is common. Ask your health care provider which countries are considered high risk.  Your parents were born in a high-risk country, and you have not been immunized against hepatitis B (hepatitis B vaccine).  You have HIV or AIDS.  You use needles to inject street drugs.  You live with someone who has hepatitis B.  You have had sex with someone who has hepatitis B.  You get hemodialysis treatment.  You take certain medicines for conditions, including cancer, organ transplantation, and autoimmune conditions. Hepatitis C  Blood testing is recommended for:  Everyone born from 1945 through 1965.  Anyone with known risk factors for hepatitis C. Sexually transmitted infections (STIs)  You should be screened for sexually transmitted infections (STIs) including gonorrhea and chlamydia if:  You are sexually active and are younger than 47 years of age.  You are older than 47 years of age and your health care provider tells you that you are at risk for this type of infection.  Your sexual activity has changed since you were last screened and you are at an increased risk for chlamydia or gonorrhea. Ask your health care provider if you are at risk.  If you do not have HIV, but are at risk, it may be recommended that you take a prescription medicine daily to prevent HIV infection. This is called pre-exposure prophylaxis (PrEP). You are considered at risk if:  You are sexually active and do not regularly use condoms or know the HIV status of your partner(s).  You take drugs by injection.  You are sexually  active with a partner who has HIV. Talk with your health care provider about whether you are at high risk of being infected with HIV. If you choose to begin PrEP, you should first be tested for HIV. You should then be tested every 3 months for as long as you are taking PrEP. Pregnancy  If you are premenopausal and you may become pregnant, ask your health care provider about preconception counseling.  If you may become pregnant, take 400 to 800 micrograms (mcg) of folic acid   every day.  If you want to prevent pregnancy, talk to your health care provider about birth control (contraception). Osteoporosis and menopause  Osteoporosis is a disease in which the bones lose minerals and strength with aging. This can result in serious bone fractures. Your risk for osteoporosis can be identified using a bone density scan.  If you are 3 years of age or older, or if you are at risk for osteoporosis and fractures, ask your health care provider if you should be screened.  Ask your health care provider whether you should take a calcium or vitamin D supplement to lower your risk for osteoporosis.  Menopause may have certain physical symptoms and risks.  Hormone replacement therapy may reduce some of these symptoms and risks. Talk to your health care provider about whether hormone replacement therapy is right for you. Follow these instructions at home:  Schedule regular health, dental, and eye exams.  Stay current with your immunizations.  Do not use any tobacco products including cigarettes, chewing tobacco, or electronic cigarettes.  If you are pregnant, do not drink alcohol.  If you are breastfeeding, limit how much and how often you drink alcohol.  Limit alcohol intake to no more than 1 drink per day for nonpregnant women. One drink equals 12 ounces of beer, 5 ounces of wine, or 1 ounces of hard liquor.  Do not use street drugs.  Do not share needles.  Ask your health care provider for  help if you need support or information about quitting drugs.  Tell your health care provider if you often feel depressed.  Tell your health care provider if you have ever been abused or do not feel safe at home. This information is not intended to replace advice given to you by your health care provider. Make sure you discuss any questions you have with your health care provider. Document Released: 09/13/2010 Document Revised: 08/06/2015 Document Reviewed: 12/02/2014  2017 Elsevier Intrauterine Device Information Introduction An intrauterine device (IUD) is a medical device that gets inserted into the uterus to prevent pregnancy. It is a small, T-shaped device that has one or two nylon strings hanging down from it. The strings hang out of the lower part of the uterus (cervix) to allow for future IUD removal. There are two types of IUDs available:  Copper IUD. This type of IUD has copper wire wrapped around it. A copper IUD may last up to 10 years.  Hormone IUD. This type of IUD is made of plastic and contains the hormone progestin (synthetic progesterone). A hormone IUD may last 3 to 5 years. IUDs are inserted through the vagina and placed into the uterus with a minor medical procedure. How does the IUD work? Copper in the copper IUD prevents pregnancy by making the uterus and fallopian tubes produce a fluid that kills sperm. Synthetic progesterone in hormonal IUD prevents pregnancy by:  Thickening cervical mucus to prevent sperm from entering the uterus.  Thinning the uterine lining to prevent implantation of a fertilized egg.  Weakening or killing sperm that get into the uterus. What are the advantages of an IUD?  IUDs are highly effective, reversible, long-acting, and low-maintenance.  There are no estrogen-related side effects.  An IUD can be used when breastfeeding.  IUDs are not associated with weight gain.  Advantages of the copper IUD are that:  It works immediately  after insertion.  It does not interfere with your body's natural hormones.  It can be used for 10 years.  Advantages  of the hormone IUD are that:  If it is inserted within 7 days of your period starting, it works immediately after insertion. If the hormone IUD is inserted at any other time in your cycle, you will need to use a backup method of birth control for 7 days after insertion.  It can make menstrual periods lighter.  It can decrease menstrual cramping.  It can be used for 3 or 5 years. What are the disadvantages of an IUD?  The hormone IUD may cause irregular menstrual bleeding for a period of time after insertion.  The copper IUD can make your menstrual flow heavier and more painful.  You may experience some pain during insertion, and cramping and vaginal bleeding after insertion. How is the IUD removed? Is the IUD right for me?  Your health care provider will make sure you are a good candidate for an IUD and will discuss side effects with you. This information is not intended to replace advice given to you by your health care provider. Make sure you discuss any questions you have with your health care provider. Document Released: 02/02/2004 Document Revised: 08/06/2015 Document Reviewed: 08/19/2012  2017 Elsevier Levonorgestrel intrauterine device (IUD) What is this medicine? LEVONORGESTREL IUD (LEE voe nor jes trel) is a contraceptive (birth control) device. The device is placed inside the uterus by a healthcare professional. It is used to prevent pregnancy. This device can also be used to treat heavy bleeding that occurs during your period. This medicine may be used for other purposes; ask your health care provider or pharmacist if you have questions. COMMON BRAND NAME(S): Minette Headland What should I tell my health care provider before I take this medicine? They need to know if you have any of these conditions: -abnormal Pap smear -cancer of the  breast, uterus, or cervix -diabetes -endometritis -genital or pelvic infection now or in the past -have more than one sexual partner or your partner has more than one partner -heart disease -history of an ectopic or tubal pregnancy -immune system problems -IUD in place -liver disease or tumor -problems with blood clots or take blood-thinners -seizures -use intravenous drugs -uterus of unusual shape -vaginal bleeding that has not been explained -an unusual or allergic reaction to levonorgestrel, other hormones, silicone, or polyethylene, medicines, foods, dyes, or preservatives -pregnant or trying to get pregnant -breast-feeding How should I use this medicine? This device is placed inside the uterus by a health care professional. Talk to your pediatrician regarding the use of this medicine in children. Special care may be needed. Overdosage: If you think you have taken too much of this medicine contact a poison control center or emergency room at once. NOTE: This medicine is only for you. Do not share this medicine with others. What if I miss a dose? This does not apply. Depending on the brand of device you have inserted, the device will need to be replaced every 3 to 5 years if you wish to continue using this type of birth control. What may interact with this medicine? Do not take this medicine with any of the following medications: -amprenavir -bosentan -fosamprenavir This medicine may also interact with the following medications: -aprepitant -barbiturate medicines for inducing sleep or treating seizures -bexarotene -griseofulvin -medicines to treat seizures like carbamazepine, ethotoin, felbamate, oxcarbazepine, phenytoin, topiramate -modafinil -pioglitazone -rifabutin -rifampin -rifapentine -some medicines to treat HIV infection like atazanavir, indinavir, lopinavir, nelfinavir, tipranavir, ritonavir -St. John's wort -warfarin This list may not describe all possible  interactions.  Give your health care provider a list of all the medicines, herbs, non-prescription drugs, or dietary supplements you use. Also tell them if you smoke, drink alcohol, or use illegal drugs. Some items may interact with your medicine. What should I watch for while using this medicine? Visit your doctor or health care professional for regular check ups. See your doctor if you or your partner has sexual contact with others, becomes HIV positive, or gets a sexual transmitted disease. This product does not protect you against HIV infection (AIDS) or other sexually transmitted diseases. You can check the placement of the IUD yourself by reaching up to the top of your vagina with clean fingers to feel the threads. Do not pull on the threads. It is a good habit to check placement after each menstrual period. Call your doctor right away if you feel more of the IUD than just the threads or if you cannot feel the threads at all. The IUD may come out by itself. You may become pregnant if the device comes out. If you notice that the IUD has come out use a backup birth control method like condoms and call your health care provider. Using tampons will not change the position of the IUD and are okay to use during your period. This IUD can be safely scanned with magnetic resonance imaging (MRI) only under specific conditions. Before you have an MRI, tell your healthcare provider that you have an IUD in place, and which type of IUD you have in place. What side effects may I notice from receiving this medicine? Side effects that you should report to your doctor or health care professional as soon as possible: -allergic reactions like skin rash, itching or hives, swelling of the face, lips, or tongue -fever, flu-like symptoms -genital sores -high blood pressure -no menstrual period for 6 weeks during use -pain, swelling, warmth in the leg -pelvic pain or tenderness -severe or sudden headache -signs of  pregnancy -stomach cramping -sudden shortness of breath -trouble with balance, talking, or walking -unusual vaginal bleeding, discharge -yellowing of the eyes or skin Side effects that usually do not require medical attention (report to your doctor or health care professional if they continue or are bothersome): -acne -breast pain -change in sex drive or performance -changes in weight -cramping, dizziness, or faintness while the device is being inserted -headache -irregular menstrual bleeding within first 3 to 6 months of use -nausea This list may not describe all possible side effects. Call your doctor for medical advice about side effects. You may report side effects to FDA at 1-800-FDA-1088. Where should I keep my medicine? This does not apply. NOTE: This sheet is a summary. It may not cover all possible information. If you have questions about this medicine, talk to your doctor, pharmacist, or health care provider.  2017 Elsevier/[employer] Standard (2015-08-21 13:46:37)

## 2016-03-23 ENCOUNTER — Encounter: Payer: Self-pay | Admitting: Women's Health

## 2016-03-23 LAB — URINALYSIS W MICROSCOPIC + REFLEX CULTURE
BACTERIA UA: NONE SEEN [HPF]
Bilirubin Urine: NEGATIVE
CASTS: NONE SEEN [LPF]
CRYSTALS: NONE SEEN [HPF]
Glucose, UA: NEGATIVE
HGB URINE DIPSTICK: NEGATIVE
KETONES UR: NEGATIVE
Leukocytes, UA: NEGATIVE
Nitrite: NEGATIVE
PROTEIN: NEGATIVE
RBC / HPF: NONE SEEN RBC/HPF (ref <=2)
Specific Gravity, Urine: 1.019 (ref 1.001–1.035)
YEAST: NONE SEEN [HPF]
pH: 6.5 (ref 5.0–8.0)

## 2016-03-23 LAB — HIV ANTIBODY (ROUTINE TESTING W REFLEX): HIV 1&2 Ab, 4th Generation: NONREACTIVE

## 2016-03-23 LAB — HEPATITIS C ANTIBODY: HCV Ab: NEGATIVE

## 2016-03-23 LAB — HEPATITIS B SURFACE ANTIGEN: Hepatitis B Surface Ag: NEGATIVE

## 2016-03-23 LAB — HCG, QUANTITATIVE, PREGNANCY

## 2016-03-23 LAB — RPR

## 2016-03-24 LAB — URINE CULTURE

## 2016-03-24 LAB — GC/CHLAMYDIA PROBE AMP
CT Probe RNA: NOT DETECTED
GC PROBE AMP APTIMA: NOT DETECTED

## 2016-03-30 ENCOUNTER — Ambulatory Visit: Payer: PRIVATE HEALTH INSURANCE | Admitting: Women's Health

## 2016-03-30 ENCOUNTER — Other Ambulatory Visit: Payer: PRIVATE HEALTH INSURANCE

## 2016-04-04 ENCOUNTER — Other Ambulatory Visit: Payer: Self-pay | Admitting: *Deleted

## 2016-04-04 MED ORDER — METOPROLOL TARTRATE 25 MG PO TABS: 25.0000 mg | ORAL_TABLET | Freq: Two times a day (BID) | ORAL | 6 refills | Status: DC

## 2016-04-06 ENCOUNTER — Telehealth (HOSPITAL_COMMUNITY): Payer: Self-pay | Admitting: Radiology

## 2016-04-06 NOTE — Telephone Encounter (Signed)
Called to schedule echocardiogram 

## 2016-04-11 ENCOUNTER — Other Ambulatory Visit: Payer: Self-pay | Admitting: Family Medicine

## 2016-04-11 DIAGNOSIS — Z0000 Encounter for general adult medical examination without abnormal findings: Secondary | ICD-10-CM

## 2016-04-11 DIAGNOSIS — R739 Hyperglycemia, unspecified: Secondary | ICD-10-CM

## 2016-04-11 DIAGNOSIS — E785 Hyperlipidemia, unspecified: Secondary | ICD-10-CM

## 2016-04-13 ENCOUNTER — Other Ambulatory Visit: Payer: Self-pay | Admitting: Family Medicine

## 2016-04-13 DIAGNOSIS — R739 Hyperglycemia, unspecified: Secondary | ICD-10-CM

## 2016-04-13 DIAGNOSIS — Z0000 Encounter for general adult medical examination without abnormal findings: Secondary | ICD-10-CM

## 2016-04-13 DIAGNOSIS — E785 Hyperlipidemia, unspecified: Secondary | ICD-10-CM

## 2016-04-13 DIAGNOSIS — E782 Mixed hyperlipidemia: Secondary | ICD-10-CM

## 2016-04-14 ENCOUNTER — Other Ambulatory Visit: Payer: Self-pay | Admitting: Family Medicine

## 2016-04-14 ENCOUNTER — Telehealth (HOSPITAL_COMMUNITY): Payer: Self-pay | Admitting: Radiology

## 2016-04-14 DIAGNOSIS — R739 Hyperglycemia, unspecified: Secondary | ICD-10-CM

## 2016-04-14 DIAGNOSIS — E785 Hyperlipidemia, unspecified: Secondary | ICD-10-CM

## 2016-04-14 DIAGNOSIS — Z0000 Encounter for general adult medical examination without abnormal findings: Secondary | ICD-10-CM

## 2016-04-14 DIAGNOSIS — E782 Mixed hyperlipidemia: Secondary | ICD-10-CM

## 2016-04-14 MED ORDER — AMPHETAMINE-DEXTROAMPHETAMINE 20 MG PO TABS: 20.0000 mg | ORAL_TABLET | Freq: Three times a day (TID) | ORAL | 0 refills | Status: DC

## 2016-04-14 MED ORDER — BUPROPION HCL ER (XL) 300 MG PO TB24: 300.0000 mg | ORAL_TABLET | Freq: Every day | ORAL | 1 refills | Status: DC

## 2016-04-14 NOTE — Telephone Encounter (Signed)
Printed Adderall/sent in wellbutrin per mychart request/MD ok to do. Will instruct patient of prescriptions by mychart.

## 2016-04-14 NOTE — Telephone Encounter (Signed)
Left message to schedule echocardiogram.

## 2016-04-20 ENCOUNTER — Ambulatory Visit (INDEPENDENT_AMBULATORY_CARE_PROVIDER_SITE_OTHER): Payer: PRIVATE HEALTH INSURANCE | Admitting: *Deleted

## 2016-04-20 DIAGNOSIS — I351 Nonrheumatic aortic (valve) insufficiency: Secondary | ICD-10-CM | POA: Diagnosis not present

## 2016-04-20 DIAGNOSIS — Z5181 Encounter for therapeutic drug level monitoring: Secondary | ICD-10-CM | POA: Diagnosis not present

## 2016-04-20 DIAGNOSIS — Z952 Presence of prosthetic heart valve: Secondary | ICD-10-CM | POA: Diagnosis not present

## 2016-04-20 LAB — POCT INR: INR: 2.6

## 2016-04-22 ENCOUNTER — Encounter: Payer: Self-pay | Admitting: Women's Health

## 2016-04-22 ENCOUNTER — Other Ambulatory Visit: Payer: Self-pay | Admitting: Women's Health

## 2016-04-22 ENCOUNTER — Ambulatory Visit (INDEPENDENT_AMBULATORY_CARE_PROVIDER_SITE_OTHER): Payer: PRIVATE HEALTH INSURANCE | Admitting: Women's Health

## 2016-04-22 ENCOUNTER — Ambulatory Visit (INDEPENDENT_AMBULATORY_CARE_PROVIDER_SITE_OTHER): Payer: PRIVATE HEALTH INSURANCE

## 2016-04-22 VITALS — BP 128/86

## 2016-04-22 DIAGNOSIS — N852 Hypertrophy of uterus: Secondary | ICD-10-CM

## 2016-04-22 DIAGNOSIS — R1030 Lower abdominal pain, unspecified: Secondary | ICD-10-CM

## 2016-04-22 DIAGNOSIS — N83201 Unspecified ovarian cyst, right side: Secondary | ICD-10-CM | POA: Diagnosis not present

## 2016-04-22 DIAGNOSIS — D251 Intramural leiomyoma of uterus: Secondary | ICD-10-CM

## 2016-04-22 NOTE — Patient Instructions (Signed)
Ovarian Cyst  An ovarian cyst is a fluid-filled sac that forms on an ovary. The ovaries are small organs that produce eggs in women. Various types of cysts can form on the ovaries. Some may cause symptoms and require treatment. Most ovarian cysts go away on their own, are not cancerous (are benign), and do not cause problems. Common types of ovarian cysts include:  Functional (follicle) cysts.  Occur during the menstrual cycle, and usually go away with the next menstrual cycle if you do not get pregnant.  Usually cause no symptoms.  Endometriomas.  Are cysts that form from the tissue that lines the uterus (endometrium).  Are sometimes called "chocolate cysts" because they become filled with blood that turns brown.  Can cause pain in the lower abdomen during intercourse and during your period.  Cystadenoma cysts.  Develop from cells on the outside surface of the ovary.  Can get very large and cause lower abdomen pain and pain with intercourse.  Can cause severe pain if they twist or break open (rupture).  Dermoid cysts.  Are sometimes found in both ovaries.  May contain different kinds of body tissue, such as skin, teeth, hair, or cartilage.  Usually do not cause symptoms unless they get very big.  Theca lutein cysts.  Occur when too much of a certain hormone (human chorionic gonadotropin) is produced and overstimulates the ovaries to produce an egg.  Are most common after having procedures used to assist with the conception of a baby (in vitro fertilization). What are the causes? Ovarian cysts may be caused by:  Ovarian hyperstimulation syndrome. This is a condition that can develop from taking fertility medicines. It causes multiple large ovarian cysts to form.  Polycystic ovarian syndrome (PCOS). This is a common hormonal disorder that can cause ovarian cysts, as well as problems with your period or fertility. What increases the risk? The following factors may make you  more likely to develop ovarian cysts:  Being overweight or obese.  Taking fertility medicines.  Taking certain forms of hormonal birth control.  Smoking. What are the signs or symptoms? Many ovarian cysts do not cause symptoms. If symptoms are present, they may include:  Pelvic pain or pressure.  Pain in the lower abdomen.  Pain during sex.  Abdominal swelling.  Abnormal menstrual periods.  Increasing pain with menstrual periods. How is this diagnosed? These cysts are commonly found during a routine pelvic exam. You may have tests to find out more about the cyst, such as:  Ultrasound.  X-ray of the pelvis.  CT scan.  MRI.  Blood tests. How is this treated? Many ovarian cysts go away on their own without treatment. Your health care provider may want to check your cyst regularly for 2-3 months to see if it changes. If you are in menopause, it is especially important to have your cyst monitored closely because menopausal women have a higher rate of ovarian cancer. When treatment is needed, it may include:  Medicines to help relieve pain.  A procedure to drain the cyst (aspiration).  Surgery to remove the whole cyst.  Hormone treatment or birth control pills. These methods are sometimes used to help dissolve a cyst. Follow these instructions at home:  Take over-the-counter and prescription medicines only as told by your health care provider.  Do not drive or use heavy machinery while taking prescription pain medicine.  Get regular pelvic exams and Pap tests as often as told by your health care provider.  Return to your   normal activities as told by your health care provider. Ask your health care provider what activities are safe for you.  Do not use any products that contain nicotine or tobacco, such as cigarettes and e-cigarettes. If you need help quitting, ask your health care provider.  Keep all follow-up visits as told by your health care provider. This is  important. Contact a health care provider if:  Your periods are late, irregular, or painful, or they stop.  You have pelvic pain that does not go away.  You have pressure on your bladder or trouble emptying your bladder completely.  You have pain during sex.  You have any of the following in your abdomen:  A feeling of fullness.  Pressure.  Discomfort.  Pain that does not go away.  Swelling.  You feel generally ill.  You become constipated.  You lose your appetite.  You develop severe acne.  You start to have more body hair and facial hair.  You are gaining weight or losing weight without changing your exercise and eating habits.  You think you may be pregnant. Get help right away if:  You have abdominal pain that is severe or gets worse.  You cannot eat or drink without vomiting.  You suddenly develop a fever.  Your menstrual period is much heavier than usual. This information is not intended to replace advice given to you by your health care provider. Make sure you discuss any questions you have with your health care provider. Document Released: 02/28/2005 Document Revised: 09/18/2015 Document Reviewed: 08/02/2015 Elsevier Interactive Patient Education  2017 Elsevier Inc.  

## 2016-04-22 NOTE — Progress Notes (Signed)
Presents today for ultrasound, complaint of bloating/pressure and abdominal discomfort. Monthly cycle, history of infertility. Condoms occasionally. Age 46 ( 2005) breast cancer lumpectomy, radiation and chemotherapy. 2006 LSO for benign cyst.  2001 cardiac surgery, 01/2015 cardiac valve replacement for aortic insufficiency on Coumadin. Doing well. Cardiologist manages labs and meds. Denies fever, chills, nausea, vomiting, chest pains or shortness of breath. Primary-care manages hypertension, hypercholesterolemia, anxiety and depression.  ROS: A ROS was performed and pertinent positives and negatives are included.  Exam: Appears well, in no acute distress.   Ultrasound :  T/V & T/A Anteverted UT with intramural Fibroids=15 x 38 x 36 mm, 23 x 17 mm, 21 x 20 mm, RT OV with thin-walled Cyst Avascular = 28 x 24 x 21 mm internal diffuse low level echoes neg BF /LT Adnexal negative. Neg CDS  Abdominal bloating/pressure/discomfort Cyst of right ovary Fibroids Aortic  valve replacement on Coumadin 2005 breast cancer  Plan: Reviewed result of ultrasound. Increase in size of fibroids from last scan discussed. .  CA 125, triage on results. Reviewed fibroids may be causing some of the discomfort/bloating/pressure..  Encouraged abdominal weight loss may also help with discomfort. Has had weight gain mostly in her abdomen over the past few years.

## 2016-04-25 LAB — CA 125: CA 125: 23 U/mL (ref <35)

## 2016-04-25 MED FILL — DEXTROAMP-AMPHETAMIN 20 MG: 20 | 30 days supply | Qty: 90 | Fill #0

## 2016-04-25 MED FILL — tiZANidine HCL 4 MG TABS: 4 | 30 days supply | Qty: 60 | Fill #0

## 2016-04-26 ENCOUNTER — Encounter: Payer: Self-pay | Admitting: Women's Health

## 2016-05-04 ENCOUNTER — Other Ambulatory Visit: Payer: Self-pay | Admitting: Women's Health

## 2016-05-04 DIAGNOSIS — N83201 Unspecified ovarian cyst, right side: Secondary | ICD-10-CM

## 2016-06-01 ENCOUNTER — Ambulatory Visit (INDEPENDENT_AMBULATORY_CARE_PROVIDER_SITE_OTHER): Payer: PRIVATE HEALTH INSURANCE | Admitting: *Deleted

## 2016-06-01 DIAGNOSIS — Z5181 Encounter for therapeutic drug level monitoring: Secondary | ICD-10-CM

## 2016-06-01 DIAGNOSIS — Z952 Presence of prosthetic heart valve: Secondary | ICD-10-CM | POA: Diagnosis not present

## 2016-06-01 DIAGNOSIS — I351 Nonrheumatic aortic (valve) insufficiency: Secondary | ICD-10-CM | POA: Diagnosis not present

## 2016-06-01 LAB — POCT INR: INR: 2.1

## 2016-06-06 ENCOUNTER — Ambulatory Visit (INDEPENDENT_AMBULATORY_CARE_PROVIDER_SITE_OTHER): Payer: PRIVATE HEALTH INSURANCE | Admitting: Family Medicine

## 2016-06-06 ENCOUNTER — Encounter: Payer: Self-pay | Admitting: Family Medicine

## 2016-06-06 VITALS — BP 126/72 | HR 67 | Temp 98.2°F | Resp 18 | Wt 195.4 lb

## 2016-06-06 DIAGNOSIS — R739 Hyperglycemia, unspecified: Secondary | ICD-10-CM

## 2016-06-06 DIAGNOSIS — E782 Mixed hyperlipidemia: Secondary | ICD-10-CM

## 2016-06-06 DIAGNOSIS — K589 Irritable bowel syndrome without diarrhea: Secondary | ICD-10-CM | POA: Diagnosis not present

## 2016-06-06 DIAGNOSIS — K219 Gastro-esophageal reflux disease without esophagitis: Secondary | ICD-10-CM

## 2016-06-06 DIAGNOSIS — I351 Nonrheumatic aortic (valve) insufficiency: Secondary | ICD-10-CM | POA: Diagnosis not present

## 2016-06-06 DIAGNOSIS — Z0000 Encounter for general adult medical examination without abnormal findings: Secondary | ICD-10-CM

## 2016-06-06 DIAGNOSIS — E6609 Other obesity due to excess calories: Secondary | ICD-10-CM | POA: Diagnosis not present

## 2016-06-06 HISTORY — DX: Irritable bowel syndrome, unspecified: K58.9

## 2016-06-06 LAB — COMPREHENSIVE METABOLIC PANEL
ALT: 19 U/L (ref 0–35)
AST: 25 U/L (ref 0–37)
Albumin: 4.3 g/dL (ref 3.5–5.2)
Alkaline Phosphatase: 38 U/L — ABNORMAL LOW (ref 39–117)
BUN: 16 mg/dL (ref 6–23)
CHLORIDE: 102 meq/L (ref 96–112)
CO2: 28 meq/L (ref 19–32)
CREATININE: 0.8 mg/dL (ref 0.40–1.20)
Calcium: 9 mg/dL (ref 8.4–10.5)
GFR: 81.81 mL/min (ref >60.00)
Glucose, Bld: 98 mg/dL (ref 70–99)
Potassium: 4 mEq/L (ref 3.5–5.1)
SODIUM: 137 meq/L (ref 135–145)
Total Bilirubin: 0.4 mg/dL (ref 0.2–1.2)
Total Protein: 7.2 g/dL (ref 6.0–8.3)

## 2016-06-06 LAB — CBC
HCT: 38.4 % (ref 36.0–46.0)
Hemoglobin: 13.1 g/dL (ref 12.0–15.0)
MCHC: 34.2 g/dL (ref 30.0–36.0)
MCV: 94.9 fl (ref 78.0–100.0)
Platelets: 245 10*3/uL (ref 150.0–400.0)
RBC: 4.05 Mil/uL (ref 3.87–5.11)
RDW: 13.7 % (ref 11.5–15.5)
WBC: 7.9 10*3/uL (ref 4.0–10.5)

## 2016-06-06 LAB — LIPID PANEL
CHOL/HDL RATIO: 3
Cholesterol: 143 mg/dL (ref 0–200)
HDL: 43.1 mg/dL (ref >39.00)
LDL CALC: 71 mg/dL (ref 0–99)
NonHDL: 99.82
Triglycerides: 146 mg/dL (ref 0.0–149.0)
VLDL: 29.2 mg/dL (ref 0.0–40.0)

## 2016-06-06 LAB — TSH: TSH: 4.22 u[IU]/mL (ref 0.35–4.50)

## 2016-06-06 LAB — HEMOGLOBIN A1C: Hgb A1c MFr Bld: 5.5 % (ref 4.6–6.5)

## 2016-06-06 MED ORDER — AMPHETAMINE-DEXTROAMPHET ER 20 MG PO CP24: 20.0000 mg | ORAL_CAPSULE | Freq: Three times a day (TID) | ORAL | 0 refills | Status: DC

## 2016-06-06 MED ORDER — AMPHETAMINE-DEXTROAMPHETAMINE 20 MG PO TABS: 20.0000 mg | ORAL_TABLET | Freq: Three times a day (TID) | ORAL | 0 refills | Status: DC

## 2016-06-06 NOTE — Assessment & Plan Note (Signed)
Encouraged increased hydration and fiber in diet. Daily probiotics. If bowels not moving can use MOM 2 tbls po in 4 oz of warm prune juice by mouth every 2-3 days. If no results then repeat in 4 hours with  Dulcolax suppository pr, may repeat again in 4 more hours as needed. Seek care if symptoms worsen. Consider daily Miralax and/or Dulcolax if symptoms persist. Refer to gastroentology for consideration

## 2016-06-06 NOTE — Assessment & Plan Note (Signed)
Due to increased pressure around valve so due for repeat Echo now agrees to call cardiology

## 2016-06-06 NOTE — Assessment & Plan Note (Signed)
Complains of chronic abdominal bloating and dyspepsia daily. Takes probiotics daily and Omeprazole daily. Try adding Famotidine qhs and will refer to gastroenterolgy.
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
## 2016-06-06 NOTE — Patient Instructions (Addendum)
"  NOW" Probiotic  Encouraged increased hydration and fiber in diet. Daily probiotics. If bowels not moving can use MOM 2 tbls po in 4 oz of warm prune juice by mouth every 2-3 days. If no results then repeat in 4 hours with  Dulcolax suppository pr, may repeat again in 4 more hours as needed. Seek care if symptoms worsen. Consider daily Miralax and/or Dulcolax if symptoms persist.  Benefiber or Metamucil  Food Choices for Gastroesophageal Reflux Disease, Adult When you have gastroesophageal reflux disease (GERD), the foods you eat and your eating habits are very important. Choosing the right foods can help ease your discomfort. What guidelines do I need to follow?  Choose fruits, vegetables, whole grains, and low-fat dairy products.  Choose low-fat meat, fish, and poultry.  Limit fats such as oils, salad dressings, butter, nuts, and avocado.  Keep a food diary. This helps you identify foods that cause symptoms.  Avoid foods that cause symptoms. These may be different for everyone.  Eat small meals often instead of 3 large meals a day.  Eat your meals slowly, in a place where you are relaxed.  Limit fried foods.  Cook foods using methods other than frying.  Avoid drinking alcohol.  Avoid drinking large amounts of liquids with your meals.  Avoid bending over or lying down until 2-3 hours after eating. What foods are not recommended? These are some foods and drinks that may make your symptoms worse: Vegetables  Tomatoes. Tomato juice. Tomato and spaghetti sauce. Chili peppers. Onion and garlic. Horseradish. Fruits  Oranges, grapefruit, and lemon (fruit and juice). Meats  High-fat meats, fish, and poultry. This includes hot dogs, ribs, ham, sausage, salami, and bacon. Dairy  Whole milk and chocolate milk. Sour cream. Cream. Butter. Ice cream. Cream cheese. Drinks  Coffee and tea. Bubbly (carbonated) drinks or energy drinks. Condiments  Hot sauce. Barbecue  sauce. Sweets/Desserts  Chocolate and cocoa. Donuts. Peppermint and spearmint. Fats and Oils  High-fat foods. This includes [place] fries and potato chips. Other  Vinegar. Strong spices. This includes black pepper, white pepper, red pepper, cayenne, curry powder, cloves, ginger, and chili powder. The items listed above may not be a complete list of foods and drinks to avoid. Contact your dietitian for more information.  This information is not intended to replace advice given to you by your health care provider. Make sure you discuss any questions you have with your health care provider. Document Released: 08/30/2011 Document Revised: 08/06/2015 Document Reviewed: 01/02/2013 Elsevier Interactive Patient Education  2017 [employer].

## 2016-06-06 NOTE — Assessment & Plan Note (Signed)
Encouraged DASH diet, decrease po intake and increase exercise as tolerated. Needs 7-8 hours of sleep nightly. Avoid trans fats, eat small, frequent meals every 4-5 hours with lean proteins, complex carbs and healthy fats. Minimize simple carbs, referred to bariatrics 

## 2016-06-06 NOTE — Progress Notes (Signed)
Pre visit review using our clinic review tool, if applicable. No additional management support is needed unless otherwise documented below in the visit note. 

## 2016-06-06 NOTE — Assessment & Plan Note (Signed)
Encouraged heart healthy diet, increase exercise, avoid trans fats, consider a krill oil cap daily 

## 2016-06-09 ENCOUNTER — Other Ambulatory Visit: Payer: Self-pay | Admitting: Family Medicine

## 2016-06-09 MED ORDER — ATORVASTATIN CALCIUM 20 MG PO TABS: ORAL_TABLET | ORAL | 3 refills | Status: DC

## 2016-06-20 ENCOUNTER — Telehealth (HOSPITAL_COMMUNITY): Payer: Self-pay | Admitting: Cardiology

## 2016-06-20 NOTE — Telephone Encounter (Signed)
User: Jeanie Sewer Date/time: 05/17/2016 3:46 PM  Context: [place] Schedule Orders/Appt Requests Outcome: Left Message  Phone number: [phone] Phone Type: Home Phone  Comm. type: Telephone Call type: Outgoing  Contact: [name] Relation to patient: Self  Letter:      User: Lenard Galloway V Date/time: 04/14/2016 4:10 PM  Comment: next point of contact will be phone  Context: [place] Schedule Orders/Appt Requests Outcome: Left Message  Phone number: [phone] Phone Type: Home Phone  Comm. type: Telephone Call type: Outgoing  Contact: [name] Relation to patient: Self  Letter:      User: Lenard Galloway V Date/time: 04/06/2016 3:55 PM  Context: [place] Schedule Orders/Appt Requests Outcome: Left Message  Phone number: [phone] Phone Type: Home Phone  Comm. type: Telephone Call type: Outgoing  Contact: [name] Relation to patient: Self  Letter:       12/01/15 Called pt and lmsg for her to CB.Marland KitchenRG

## 2016-06-24 ENCOUNTER — Other Ambulatory Visit: Payer: Self-pay | Admitting: Family Medicine

## 2016-06-24 DIAGNOSIS — R739 Hyperglycemia, unspecified: Secondary | ICD-10-CM

## 2016-06-24 DIAGNOSIS — E785 Hyperlipidemia, unspecified: Secondary | ICD-10-CM

## 2016-06-24 DIAGNOSIS — Z0000 Encounter for general adult medical examination without abnormal findings: Secondary | ICD-10-CM

## 2016-06-24 MED ORDER — BUPROPION HCL ER (XL) 300 MG PO TB24: 300.0000 mg | ORAL_TABLET | Freq: Every day | ORAL | 1 refills | Status: DC

## 2016-07-04 ENCOUNTER — Other Ambulatory Visit: Payer: Self-pay | Admitting: Family Medicine

## 2016-07-05 ENCOUNTER — Telehealth: Payer: PRIVATE HEALTH INSURANCE | Admitting: Family

## 2016-07-05 DIAGNOSIS — R319 Hematuria, unspecified: Secondary | ICD-10-CM

## 2016-07-05 DIAGNOSIS — N390 Urinary tract infection, site not specified: Secondary | ICD-10-CM

## 2016-07-05 MED ORDER — CEPHALEXIN 500 MG PO CAPS: 500.0000 mg | ORAL_CAPSULE | Freq: Two times a day (BID) | ORAL | 0 refills | Status: DC

## 2016-07-05 NOTE — Progress Notes (Signed)

## 2016-07-13 ENCOUNTER — Encounter: Payer: Self-pay | Admitting: Family Medicine

## 2016-07-13 ENCOUNTER — Ambulatory Visit (INDEPENDENT_AMBULATORY_CARE_PROVIDER_SITE_OTHER): Payer: PRIVATE HEALTH INSURANCE | Admitting: Pharmacist

## 2016-07-13 DIAGNOSIS — Z5181 Encounter for therapeutic drug level monitoring: Secondary | ICD-10-CM

## 2016-07-13 DIAGNOSIS — Z952 Presence of prosthetic heart valve: Secondary | ICD-10-CM | POA: Diagnosis not present

## 2016-07-13 DIAGNOSIS — I351 Nonrheumatic aortic (valve) insufficiency: Secondary | ICD-10-CM

## 2016-07-13 LAB — POCT INR: INR: 1.4

## 2016-07-20 ENCOUNTER — Encounter: Payer: Self-pay | Admitting: Gastroenterology

## 2016-07-20 ENCOUNTER — Other Ambulatory Visit (INDEPENDENT_AMBULATORY_CARE_PROVIDER_SITE_OTHER): Payer: PRIVATE HEALTH INSURANCE

## 2016-07-20 ENCOUNTER — Ambulatory Visit (INDEPENDENT_AMBULATORY_CARE_PROVIDER_SITE_OTHER): Payer: PRIVATE HEALTH INSURANCE | Admitting: Gastroenterology

## 2016-07-20 VITALS — BP 138/80 | HR 80 | Ht 63.39 in | Wt 194.1 lb

## 2016-07-20 DIAGNOSIS — R198 Other specified symptoms and signs involving the digestive system and abdomen: Secondary | ICD-10-CM

## 2016-07-20 DIAGNOSIS — R194 Change in bowel habit: Secondary | ICD-10-CM

## 2016-07-20 DIAGNOSIS — R140 Abdominal distension (gaseous): Secondary | ICD-10-CM | POA: Diagnosis not present

## 2016-07-20 LAB — IGA: IgA: 332 mg/dL (ref 68–378)

## 2016-07-20 MED ORDER — DICYCLOMINE HCL 10 MG PO CAPS: 10.0000 mg | ORAL_CAPSULE | Freq: Three times a day (TID) | ORAL | 3 refills | Status: DC | PRN

## 2016-07-20 NOTE — Patient Instructions (Addendum)
If you are age 47 or older, your body mass index should be between 23-30. Your Body mass index is 33.97 kg/m. If this is out of the aforementioned range listed, please consider follow up with your Primary Care Provider.  If you are age 45 or younger, your body mass index should be between 19-25. Your Body mass index is 33.97 kg/m. If this is out of the aformentioned range listed, please consider follow up with your Primary Care Provider.   We have sent the following medications to your pharmacy for you to pick up at your convenience:  Bentyl  Please use Miralax daily as needed.  Please discontinue NSAIDS and probiotic.  You have been given a Low FodMap to follow.  Your physician has requested that you go to the basement for the following lab work before leaving today:  [facility]  Contact the office if no better in a few weeks. May consider colonoscopy at that time.  Thank you.
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
## 2016-07-21 LAB — TISSUE TRANSGLUTAMINASE, IGA: Tissue Transglutaminase Ab, IgA: 1 U/mL (ref <4)

## 2016-07-25 ENCOUNTER — Encounter: Payer: Self-pay | Admitting: Family Medicine

## 2016-07-25 DIAGNOSIS — R739 Hyperglycemia, unspecified: Secondary | ICD-10-CM

## 2016-07-25 DIAGNOSIS — Z0000 Encounter for general adult medical examination without abnormal findings: Secondary | ICD-10-CM

## 2016-07-25 DIAGNOSIS — E785 Hyperlipidemia, unspecified: Secondary | ICD-10-CM

## 2016-07-25 MED ORDER — BUPROPION HCL ER (XL) 300 MG PO TB24: 300.0000 mg | ORAL_TABLET | Freq: Every day | ORAL | 1 refills | Status: DC

## 2016-07-27 ENCOUNTER — Encounter: Payer: Self-pay | Admitting: Gynecology

## 2016-07-29 ENCOUNTER — Encounter: Payer: Self-pay | Admitting: Family Medicine

## 2016-07-31 ENCOUNTER — Other Ambulatory Visit: Payer: Self-pay | Admitting: Family Medicine

## 2016-07-31 DIAGNOSIS — R319 Hematuria, unspecified: Principal | ICD-10-CM

## 2016-07-31 DIAGNOSIS — N1330 Unspecified hydronephrosis: Secondary | ICD-10-CM

## 2016-07-31 DIAGNOSIS — N390 Urinary tract infection, site not specified: Secondary | ICD-10-CM

## 2016-08-03 ENCOUNTER — Ambulatory Visit (INDEPENDENT_AMBULATORY_CARE_PROVIDER_SITE_OTHER): Payer: PRIVATE HEALTH INSURANCE | Admitting: *Deleted

## 2016-08-03 DIAGNOSIS — Z952 Presence of prosthetic heart valve: Secondary | ICD-10-CM

## 2016-08-03 DIAGNOSIS — Z5181 Encounter for therapeutic drug level monitoring: Secondary | ICD-10-CM | POA: Diagnosis not present

## 2016-08-03 DIAGNOSIS — I351 Nonrheumatic aortic (valve) insufficiency: Secondary | ICD-10-CM | POA: Diagnosis not present

## 2016-08-03 LAB — POCT INR: INR: 3.4

## 2016-08-17 ENCOUNTER — Ambulatory Visit (INDEPENDENT_AMBULATORY_CARE_PROVIDER_SITE_OTHER): Payer: PRIVATE HEALTH INSURANCE

## 2016-08-17 DIAGNOSIS — Z952 Presence of prosthetic heart valve: Secondary | ICD-10-CM

## 2016-08-17 DIAGNOSIS — Z5181 Encounter for therapeutic drug level monitoring: Secondary | ICD-10-CM

## 2016-08-17 DIAGNOSIS — I351 Nonrheumatic aortic (valve) insufficiency: Secondary | ICD-10-CM

## 2016-08-17 LAB — POCT INR: INR: 3

## 2016-08-25 ENCOUNTER — Encounter: Payer: Self-pay | Admitting: Family Medicine

## 2016-08-25 NOTE — Telephone Encounter (Signed)
If ok she would like to pickup the new one today if possible/or tomorrow and bring back the incorrect either tomorrow or Monday.

## 2016-08-26 ENCOUNTER — Encounter: Payer: Self-pay | Admitting: Family Medicine

## 2016-08-26 ENCOUNTER — Other Ambulatory Visit: Payer: Self-pay | Admitting: Cardiology

## 2016-08-26 ENCOUNTER — Telehealth: Payer: Self-pay | Admitting: Cardiology

## 2016-08-26 DIAGNOSIS — Q244 Congenital subaortic stenosis: Secondary | ICD-10-CM

## 2016-08-26 DIAGNOSIS — I351 Nonrheumatic aortic (valve) insufficiency: Secondary | ICD-10-CM

## 2016-08-26 MED ORDER — AMPHETAMINE-DEXTROAMPHETAMINE 20 MG PO TABS: 20.0000 mg | ORAL_TABLET | Freq: Three times a day (TID) | ORAL | 0 refills | Status: DC

## 2016-08-26 MED FILL — DEXTROAMP-AMPHETAMIN 20 MG: 20 | 30 days supply | Qty: 90 | Fill #0

## 2016-08-26 NOTE — Telephone Encounter (Signed)
ECHO has been scheduled 6/28.

## 2016-08-26 NOTE — Telephone Encounter (Signed)
Patient calling, states that at her last visit with Dr. Radford Pax in September she was instructed to have an Echo in 6 months and then f/u with Dr. Radford Pax. I did not see order, thanks.

## 2016-08-26 NOTE — Telephone Encounter (Signed)
Order taken to [facility] scheduler to call and arrange [name] appointment.

## 2016-08-26 NOTE — Telephone Encounter (Signed)
Perfect thanks

## 2016-08-26 NOTE — Telephone Encounter (Signed)
Patient picked up correct RX and dropped off incorrect adderall XR, placed in shredded bin

## 2016-09-08 ENCOUNTER — Other Ambulatory Visit: Payer: Self-pay

## 2016-09-08 ENCOUNTER — Ambulatory Visit (INDEPENDENT_AMBULATORY_CARE_PROVIDER_SITE_OTHER): Payer: PRIVATE HEALTH INSURANCE | Admitting: *Deleted

## 2016-09-08 ENCOUNTER — Ambulatory Visit (HOSPITAL_COMMUNITY): Payer: PRIVATE HEALTH INSURANCE | Attending: Cardiology

## 2016-09-08 DIAGNOSIS — I083 Combined rheumatic disorders of mitral, aortic and tricuspid valves: Secondary | ICD-10-CM | POA: Diagnosis not present

## 2016-09-08 DIAGNOSIS — Q244 Congenital subaortic stenosis: Secondary | ICD-10-CM | POA: Insufficient documentation

## 2016-09-08 DIAGNOSIS — Z952 Presence of prosthetic heart valve: Secondary | ICD-10-CM

## 2016-09-08 DIAGNOSIS — I351 Nonrheumatic aortic (valve) insufficiency: Secondary | ICD-10-CM

## 2016-09-08 DIAGNOSIS — Z5181 Encounter for therapeutic drug level monitoring: Secondary | ICD-10-CM

## 2016-09-08 LAB — POCT INR: INR: 1.6

## 2016-09-09 ENCOUNTER — Encounter: Payer: Self-pay | Admitting: Cardiology

## 2016-09-12 ENCOUNTER — Telehealth: Payer: Self-pay | Admitting: *Deleted

## 2016-09-12 NOTE — Telephone Encounter (Signed)
Lmtcb to go over echo results.  

## 2016-09-12 NOTE — Telephone Encounter (Signed)
Pt has been notified of echo results by phone with verbal understanding. Pt aware to continue on current medications. Pt thanked me for my call today.

## 2016-09-12 NOTE — Telephone Encounter (Signed)
-----   Message from Sueanne Margarita, MD sent at 09/09/2016  4:35 PM EDT ----- Echo showed mild LVH with normal LVF and stable mechanical AVR and mild MR

## 2016-09-13 IMAGING — CT CT ANGIO CHEST
2 of 5 series · 9 of 30 positions shown · IV contrast (isovue)
Comparison: Chest CT 11/11/2009.

CLINICAL DATA: 44-year-old female under preoperative evaluation
prior to potential aortic valve replacement. Fatigue, vertigo and
shortness of breath on exertion. Prior history of surgery for aortic
stenosis in 8558. History of left-sided breast cancer diagnosed in
6115 status post lumpectomy, chemotherapy and radiation therapy.

EXAM:
CT ANGIOGRAPHY CHEST WITH CONTRAST
TECHNIQUE: Multidetector CT imaging of the chest was performed using the
standard protocol during bolus administration of intravenous
contrast. Multiplanar CT image reconstructions and MIPs were
obtained to evaluate the vascular anatomy.
CONTRAST:  75 mL of Isovue 370.

[Series 4: angio · axial · 0.70mm/px · z∈[-258,-78]mm · 4 of 121 slices shown]
[im 25/121  lung]
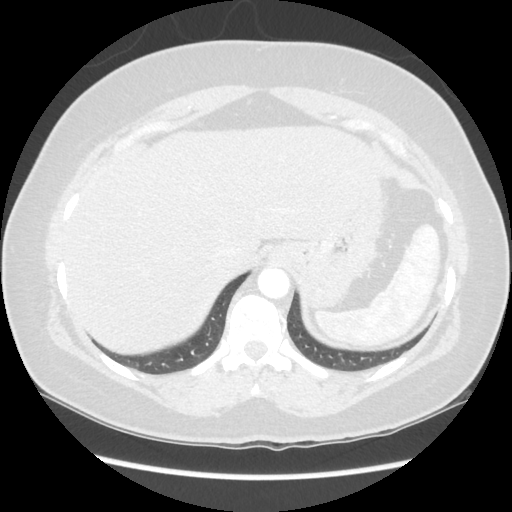
[im 49/121  mediastinal]
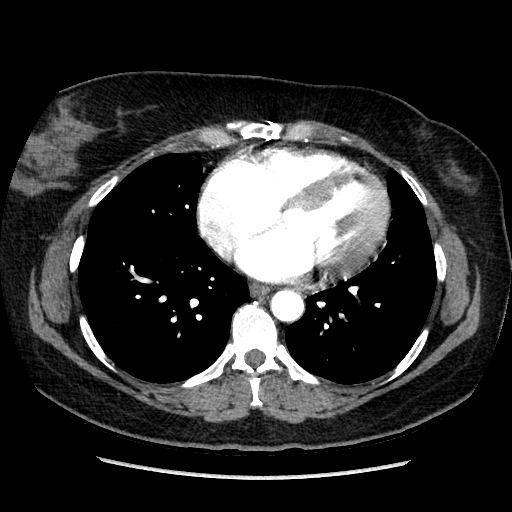
[im 73/121  lung]
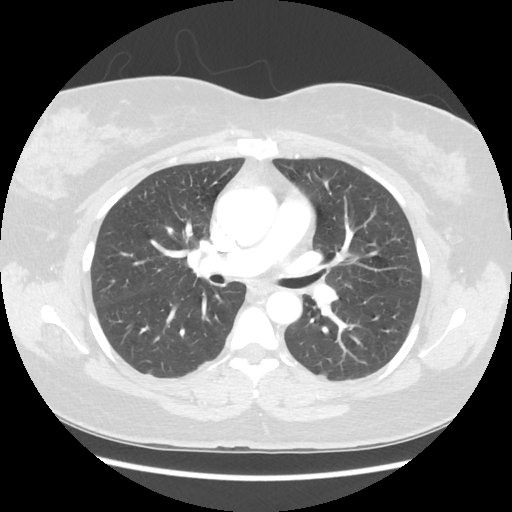
[im 97/121  mediastinal]
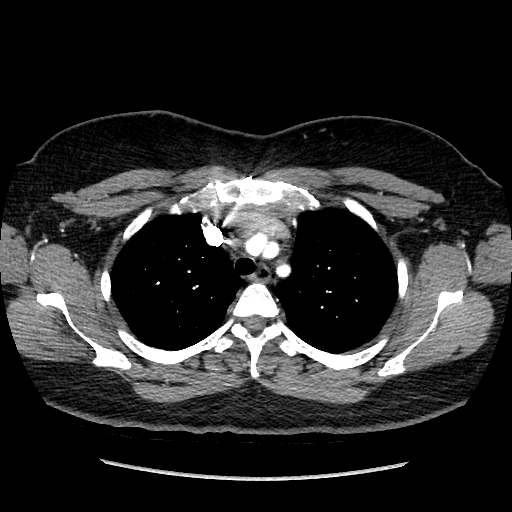

[Series 602: sagittal body · sagittal · 0.70mm/px · 5 of 145 slices shown]
[im 25/145  lung]
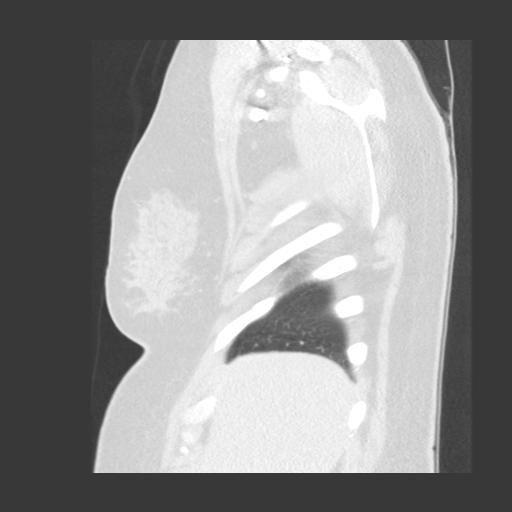
[im 49/145  lung]
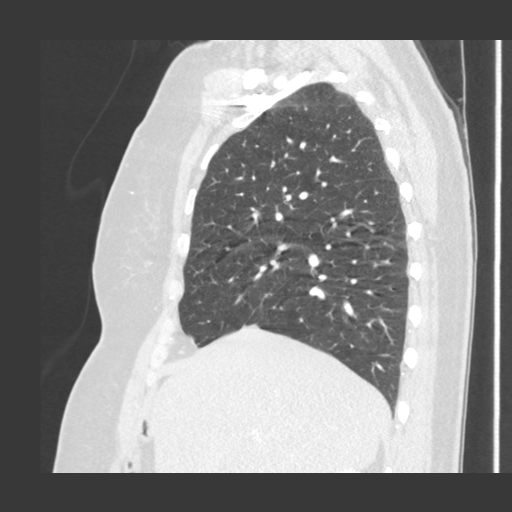
[im 73/145  lung]
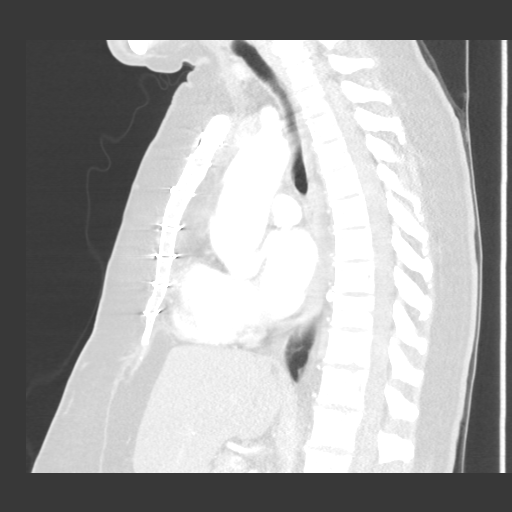
[im 97/145  lung]
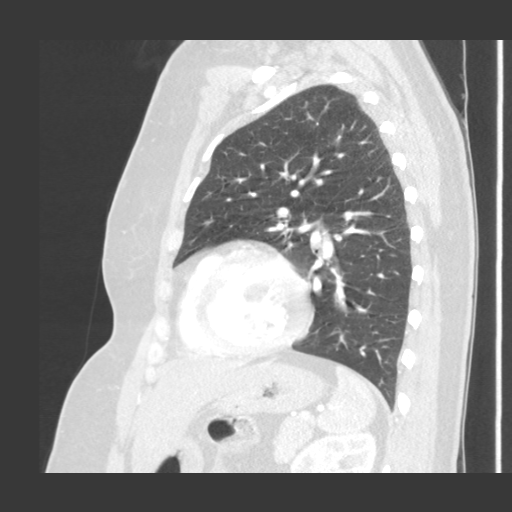
[im 121/145  lung]
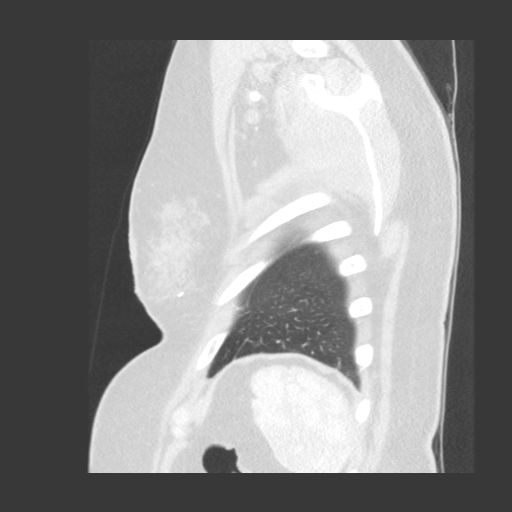

[9 of 30 positions shown; findings below may reference images not displayed]

FINDINGS: Mediastinum/Lymph Nodes: Heart size is normal. There is no
significant pericardial fluid, thickening or pericardial
calcification. Thickening of the aortic valve. Ascending aorta
appears mildly dilated, measuring up to 4.2 cm in diameter. Mid arch
measures 2.7 cm in diameter, and the descending thoracic aorta
measures 2.2 cm in diameter. No pathologically enlarged mediastinal
or hilar lymph nodes. Some amorphous soft tissue in the anterior
mediastinum has fatty stippling, favored to represent a small amount
of residual thymic tissue. Esophagus is unremarkable in appearance.
No axillary lymphadenopathy.

Lungs/Pleura: No acute consolidative airspace disease. No pleural
effusions. No suspicious appearing pulmonary nodules or masses.

Upper Abdomen: Diffuse low attenuation throughout the hepatic
parenchyma, compatible with hepatic steatosis.

Musculoskeletal/Soft Tissues: There are no aggressive appearing
lytic or blastic lesions noted in the visualized portions of the
skeleton. Median sternotomy wires.

Review of the MIP images confirms the above findings.
IMPRESSION: 1. Ectasia of ascending thoracic aorta which measures up to 4.2 cm
in diameter. Recommend annual imaging followup by CTA or MRA. This
recommendation follows 8818
ACCF/AHA/AATS/ACR/ASA/SCA/RONLOR/CARRAWAY/AUJLA/SAUL ENRIQUE Guidelines for the
Diagnosis and Management of Patients with Thoracic Aortic Disease.
Circulation. 8818; 121: [id number].
2. Thickening of the aortic valve.
3. Hepatic steatosis.

## 2016-09-19 ENCOUNTER — Encounter: Payer: Self-pay | Admitting: Family Medicine

## 2016-09-20 ENCOUNTER — Encounter: Payer: Self-pay | Admitting: Family Medicine

## 2016-09-20 ENCOUNTER — Telehealth: Payer: Self-pay | Admitting: Family Medicine

## 2016-09-20 MED ORDER — AMPHETAMINE-DEXTROAMPHETAMINE 20 MG PO TABS: 20.0000 mg | ORAL_TABLET | Freq: Three times a day (TID) | ORAL | 0 refills | Status: DC

## 2016-09-20 NOTE — Telephone Encounter (Signed)
Pt would like to have Rx for okay'd sooner that noted. Pt says that she will be going out of town and need it sooner.    Please advise.

## 2016-09-20 NOTE — Telephone Encounter (Signed)
Attempted several times to call the patient back/line was busy/no answer/no voice mail to leave a message.

## 2016-09-21 MED FILL — DEXTROAMP-AMPHETAMIN 20 MG: 20 | 30 days supply | Qty: 90 | Fill #0

## 2016-09-21 NOTE — Telephone Encounter (Signed)
Called pt- she is trying to get her adderall filled a day in advance from the pharmacy downstairs [facility]) as she is going out of town. Cleared with Suezanne Jacquet in our pharmacy

## 2016-09-21 NOTE — Telephone Encounter (Signed)
Ben with pharmacy called in to follow up. He said that pt will be in later today. He would like to be advised on if it is okay to fill pt's Rx.   PCP and CMA is out of the office today to advise. Forwarding message to DOD for further assistance.     When response received please follow up with pharmacy to advise.    Thanks.

## 2016-09-22 ENCOUNTER — Encounter (INDEPENDENT_AMBULATORY_CARE_PROVIDER_SITE_OTHER): Payer: PRIVATE HEALTH INSURANCE | Admitting: Family Medicine

## 2016-09-29 ENCOUNTER — Ambulatory Visit (INDEPENDENT_AMBULATORY_CARE_PROVIDER_SITE_OTHER): Payer: PRIVATE HEALTH INSURANCE

## 2016-09-29 DIAGNOSIS — Z952 Presence of prosthetic heart valve: Secondary | ICD-10-CM

## 2016-09-29 DIAGNOSIS — Z5181 Encounter for therapeutic drug level monitoring: Secondary | ICD-10-CM | POA: Diagnosis not present

## 2016-09-29 DIAGNOSIS — I351 Nonrheumatic aortic (valve) insufficiency: Secondary | ICD-10-CM | POA: Diagnosis not present

## 2016-09-29 LAB — POCT INR: INR: 1.9

## 2016-10-04 ENCOUNTER — Encounter (INDEPENDENT_AMBULATORY_CARE_PROVIDER_SITE_OTHER): Payer: Self-pay | Admitting: Family Medicine

## 2016-10-04 ENCOUNTER — Ambulatory Visit (INDEPENDENT_AMBULATORY_CARE_PROVIDER_SITE_OTHER): Payer: PRIVATE HEALTH INSURANCE | Admitting: Family Medicine

## 2016-10-04 VITALS — BP 126/87 | HR 65 | Temp 98.0°F | Ht 63.0 in | Wt 190.0 lb

## 2016-10-04 DIAGNOSIS — E784 Other hyperlipidemia: Secondary | ICD-10-CM | POA: Diagnosis not present

## 2016-10-04 DIAGNOSIS — I10 Essential (primary) hypertension: Secondary | ICD-10-CM

## 2016-10-04 DIAGNOSIS — Z0289 Encounter for other administrative examinations: Secondary | ICD-10-CM

## 2016-10-04 DIAGNOSIS — Z9189 Other specified personal risk factors, not elsewhere classified: Secondary | ICD-10-CM

## 2016-10-04 DIAGNOSIS — Z6833 Body mass index (BMI) 33.0-33.9, adult: Secondary | ICD-10-CM | POA: Diagnosis not present

## 2016-10-04 DIAGNOSIS — R0609 Other forms of dyspnea: Secondary | ICD-10-CM | POA: Diagnosis not present

## 2016-10-04 DIAGNOSIS — E669 Obesity, unspecified: Secondary | ICD-10-CM

## 2016-10-04 DIAGNOSIS — Z1389 Encounter for screening for other disorder: Secondary | ICD-10-CM | POA: Diagnosis not present

## 2016-10-04 DIAGNOSIS — I352 Nonrheumatic aortic (valve) stenosis with insufficiency: Secondary | ICD-10-CM | POA: Diagnosis not present

## 2016-10-04 DIAGNOSIS — R5383 Other fatigue: Secondary | ICD-10-CM

## 2016-10-04 DIAGNOSIS — E7849 Other hyperlipidemia: Secondary | ICD-10-CM

## 2016-10-04 DIAGNOSIS — Z1331 Encounter for screening for depression: Secondary | ICD-10-CM
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
## 2016-10-05 ENCOUNTER — Other Ambulatory Visit: Payer: Self-pay | Admitting: Cardiology

## 2016-10-05 LAB — COMPREHENSIVE METABOLIC PANEL
ALBUMIN: 4.5 g/dL (ref 3.5–5.5)
ALK PHOS: 53 IU/L (ref 39–117)
ALT: 19 IU/L (ref 0–32)
AST: 24 IU/L (ref 0–40)
Albumin/Globulin Ratio: 1.5 (ref 1.2–2.2)
BUN / CREAT RATIO: 16 (ref 9–23)
BUN: 14 mg/dL (ref 6–24)
Bilirubin Total: 0.3 mg/dL (ref 0.0–1.2)
CALCIUM: 9.8 mg/dL (ref 8.7–10.2)
CO2: 24 mmol/L (ref 20–29)
CREATININE: 0.87 mg/dL (ref 0.57–1.00)
Chloride: 99 mmol/L (ref 96–106)
GFR calc Af Amer: 92 mL/min/{1.73_m2} (ref >59)
GFR calc non Af Amer: 80 mL/min/{1.73_m2} (ref >59)
GLUCOSE: 102 mg/dL — AB (ref 65–99)
Globulin, Total: 3 g/dL (ref 1.5–4.5)
Potassium: 4.5 mmol/L (ref 3.5–5.2)
Sodium: 139 mmol/L (ref 134–144)
Total Protein: 7.5 g/dL (ref 6.0–8.5)

## 2016-10-05 LAB — CBC WITH DIFFERENTIAL
BASOS ABS: 0.1 10*3/uL (ref 0.0–0.2)
Basos: 1 %
EOS (ABSOLUTE): 0.2 10*3/uL (ref 0.0–0.4)
Eos: 4 %
HEMOGLOBIN: 13.5 g/dL (ref 11.1–15.9)
Hematocrit: 40.9 % (ref 34.0–46.6)
IMMATURE GRANULOCYTES: 0 %
Immature Grans (Abs): 0 10*3/uL (ref 0.0–0.1)
LYMPHS ABS: 1.7 10*3/uL (ref 0.7–3.1)
LYMPHS: 32 %
MCH: 30.5 pg (ref 26.6–33.0)
MCHC: 33 g/dL (ref 31.5–35.7)
MCV: 93 fL (ref 79–97)
MONOCYTES: 7 %
Monocytes Absolute: 0.4 10*3/uL (ref 0.1–0.9)
Neutrophils Absolute: 3 10*3/uL (ref 1.4–7.0)
Neutrophils: 56 %
RBC: 4.42 x10E6/uL (ref 3.77–5.28)
RDW: 14.3 % (ref 12.3–15.4)
WBC: 5.4 10*3/uL (ref 3.4–10.8)

## 2016-10-05 LAB — HEMOGLOBIN A1C
ESTIMATED AVERAGE GLUCOSE: 111 mg/dL
HEMOGLOBIN A1C: 5.5 % (ref 4.8–5.6)

## 2016-10-05 LAB — FOLATE: Folate: 14.1 ng/mL (ref >3.0)

## 2016-10-05 LAB — LIPID PANEL WITH LDL/HDL RATIO
CHOLESTEROL TOTAL: 254 mg/dL — AB (ref 100–199)
HDL: 41 mg/dL (ref >39)
LDL Calculated: 157 mg/dL — ABNORMAL HIGH (ref 0–99)
LDl/HDL Ratio: 3.8 ratio — ABNORMAL HIGH (ref 0.0–3.2)
TRIGLYCERIDES: 280 mg/dL — AB (ref 0–149)
VLDL Cholesterol Cal: 56 mg/dL — ABNORMAL HIGH (ref 5–40)

## 2016-10-05 LAB — TSH: TSH: 4.78 u[IU]/mL — ABNORMAL HIGH (ref 0.450–4.500)

## 2016-10-05 LAB — VITAMIN D 25 HYDROXY (VIT D DEFICIENCY, FRACTURES): Vit D, 25-Hydroxy: 37.2 ng/mL (ref 30.0–100.0)

## 2016-10-05 LAB — T3: T3, Total: 148 ng/dL (ref 71–180)

## 2016-10-05 LAB — VITAMIN B12: Vitamin B-12: 1364 pg/mL — ABNORMAL HIGH (ref 232–1245)

## 2016-10-05 LAB — INSULIN, RANDOM: INSULIN: 9.5 u[IU]/mL (ref 2.6–24.9)

## 2016-10-05 LAB — T4, FREE: FREE T4: 1.31 ng/dL (ref 0.82–1.77)

## 2016-10-17 ENCOUNTER — Other Ambulatory Visit: Payer: Self-pay | Admitting: Family Medicine

## 2016-10-17 MED ORDER — WARFARIN SODIUM 5 MG PO TABS: ORAL_TABLET | ORAL | 0 refills | Status: DC

## 2016-10-18 ENCOUNTER — Ambulatory Visit (INDEPENDENT_AMBULATORY_CARE_PROVIDER_SITE_OTHER): Payer: PRIVATE HEALTH INSURANCE | Admitting: Family Medicine

## 2016-10-18 VITALS — BP 118/77 | HR 67 | Temp 98.4°F | Ht 63.0 in | Wt 188.0 lb

## 2016-10-18 DIAGNOSIS — R7303 Prediabetes: Secondary | ICD-10-CM

## 2016-10-18 DIAGNOSIS — Z6833 Body mass index (BMI) 33.0-33.9, adult: Secondary | ICD-10-CM | POA: Diagnosis not present

## 2016-10-18 DIAGNOSIS — E669 Obesity, unspecified: Secondary | ICD-10-CM

## 2016-10-18 DIAGNOSIS — Z9189 Other specified personal risk factors, not elsewhere classified: Secondary | ICD-10-CM | POA: Diagnosis not present

## 2016-10-18 DIAGNOSIS — E559 Vitamin D deficiency, unspecified: Secondary | ICD-10-CM

## 2016-10-18 MED ORDER — VITAMIN D (ERGOCALCIFEROL) 1.25 MG (50000 UNIT) PO CAPS: 50000.0000 [IU] | ORAL_CAPSULE | ORAL | 0 refills | Status: DC

## 2016-10-18 MED ORDER — METFORMIN HCL 500 MG PO TABS: 500.0000 mg | ORAL_TABLET | Freq: Every day | ORAL | 0 refills | Status: DC
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
## 2016-10-21 ENCOUNTER — Ambulatory Visit (INDEPENDENT_AMBULATORY_CARE_PROVIDER_SITE_OTHER): Payer: PRIVATE HEALTH INSURANCE | Admitting: *Deleted

## 2016-10-21 DIAGNOSIS — Z5181 Encounter for therapeutic drug level monitoring: Secondary | ICD-10-CM

## 2016-10-21 DIAGNOSIS — I351 Nonrheumatic aortic (valve) insufficiency: Secondary | ICD-10-CM

## 2016-10-21 DIAGNOSIS — Z952 Presence of prosthetic heart valve: Secondary | ICD-10-CM | POA: Diagnosis not present

## 2016-10-21 LAB — POCT INR: INR: 1.5

## 2016-11-03 ENCOUNTER — Ambulatory Visit (INDEPENDENT_AMBULATORY_CARE_PROVIDER_SITE_OTHER): Payer: PRIVATE HEALTH INSURANCE | Admitting: Family Medicine

## 2016-11-03 VITALS — BP 120/83 | HR 70 | Temp 97.7°F | Ht 63.0 in | Wt 185.0 lb

## 2016-11-03 DIAGNOSIS — K5909 Other constipation: Secondary | ICD-10-CM | POA: Diagnosis not present

## 2016-11-03 DIAGNOSIS — Z6832 Body mass index (BMI) 32.0-32.9, adult: Secondary | ICD-10-CM

## 2016-11-03 DIAGNOSIS — E559 Vitamin D deficiency, unspecified: Secondary | ICD-10-CM

## 2016-11-03 DIAGNOSIS — E669 Obesity, unspecified: Secondary | ICD-10-CM

## 2016-11-03 DIAGNOSIS — K5900 Constipation, unspecified: Secondary | ICD-10-CM | POA: Insufficient documentation

## 2016-11-03 DIAGNOSIS — R7303 Prediabetes: Secondary | ICD-10-CM

## 2016-11-03 DIAGNOSIS — Z9189 Other specified personal risk factors, not elsewhere classified: Secondary | ICD-10-CM

## 2016-11-03 MED ORDER — POLYETHYLENE GLYCOL 3350 17 GM/SCOOP PO POWD: 17.0000 g | Freq: Every day | ORAL | 0 refills | Status: DC

## 2016-11-03 MED ORDER — METFORMIN HCL 500 MG PO TABS: 500.0000 mg | ORAL_TABLET | Freq: Every day | ORAL | 0 refills | Status: DC

## 2016-11-03 MED ORDER — VITAMIN D (ERGOCALCIFEROL) 1.25 MG (50000 UNIT) PO CAPS: 50000.0000 [IU] | ORAL_CAPSULE | ORAL | 0 refills | Status: DC
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
## 2016-11-04 ENCOUNTER — Ambulatory Visit (INDEPENDENT_AMBULATORY_CARE_PROVIDER_SITE_OTHER): Payer: PRIVATE HEALTH INSURANCE | Admitting: *Deleted

## 2016-11-04 DIAGNOSIS — Z5181 Encounter for therapeutic drug level monitoring: Secondary | ICD-10-CM | POA: Diagnosis not present

## 2016-11-04 DIAGNOSIS — I351 Nonrheumatic aortic (valve) insufficiency: Secondary | ICD-10-CM | POA: Diagnosis not present

## 2016-11-04 DIAGNOSIS — Z952 Presence of prosthetic heart valve: Secondary | ICD-10-CM | POA: Diagnosis not present

## 2016-11-04 LAB — POCT INR: INR: 2.3

## 2016-11-08 IMAGING — CR DG CHEST 2V
2 series · 2 of 2 positions shown · non-contrast
Comparison: Comparison made to CT 11/18/2014. Chest x-ray
02/08/2011.

CLINICAL DATA: Aortic insufficiency.

EXAM:
CHEST  2 VIEW

[w chest pa]
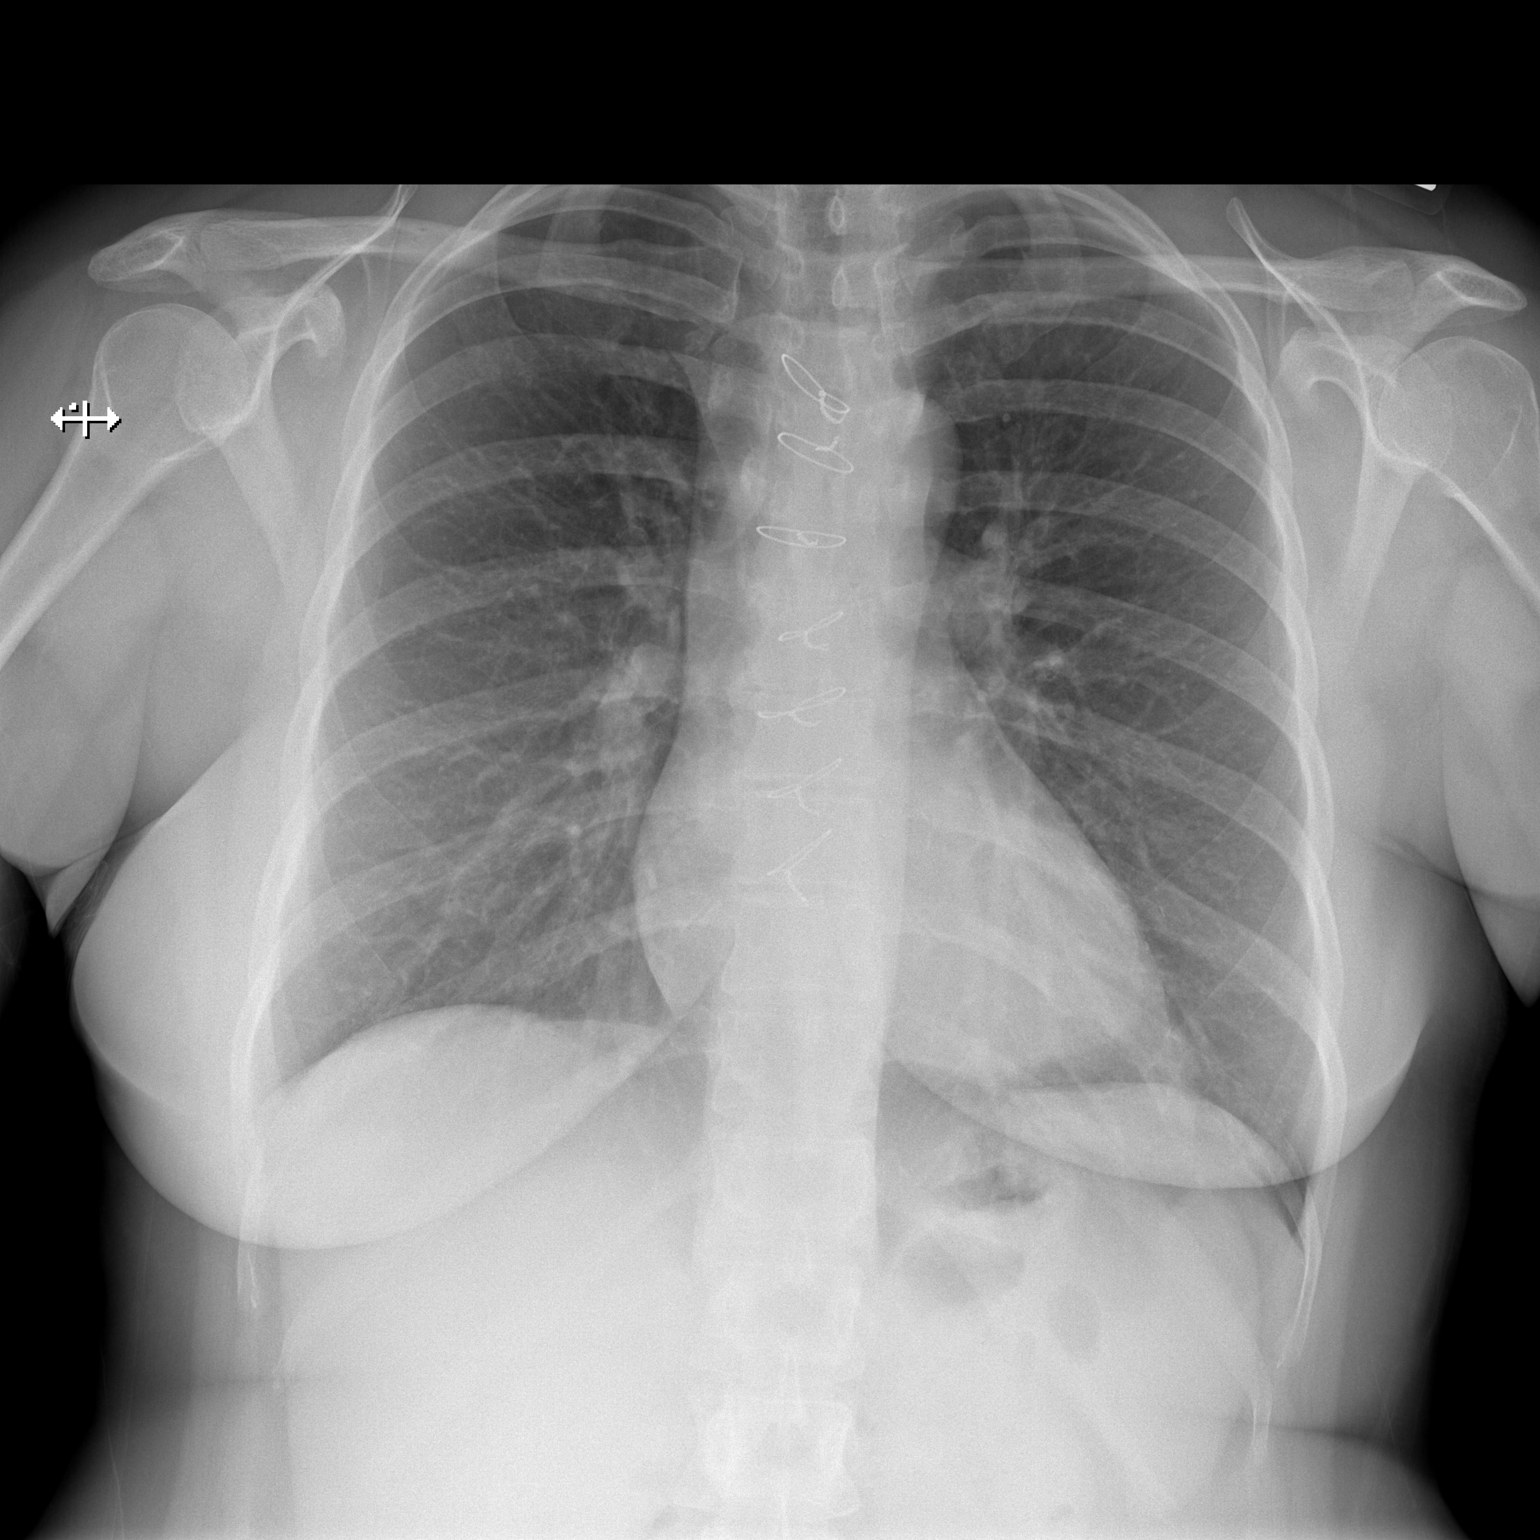

[w chest lat]
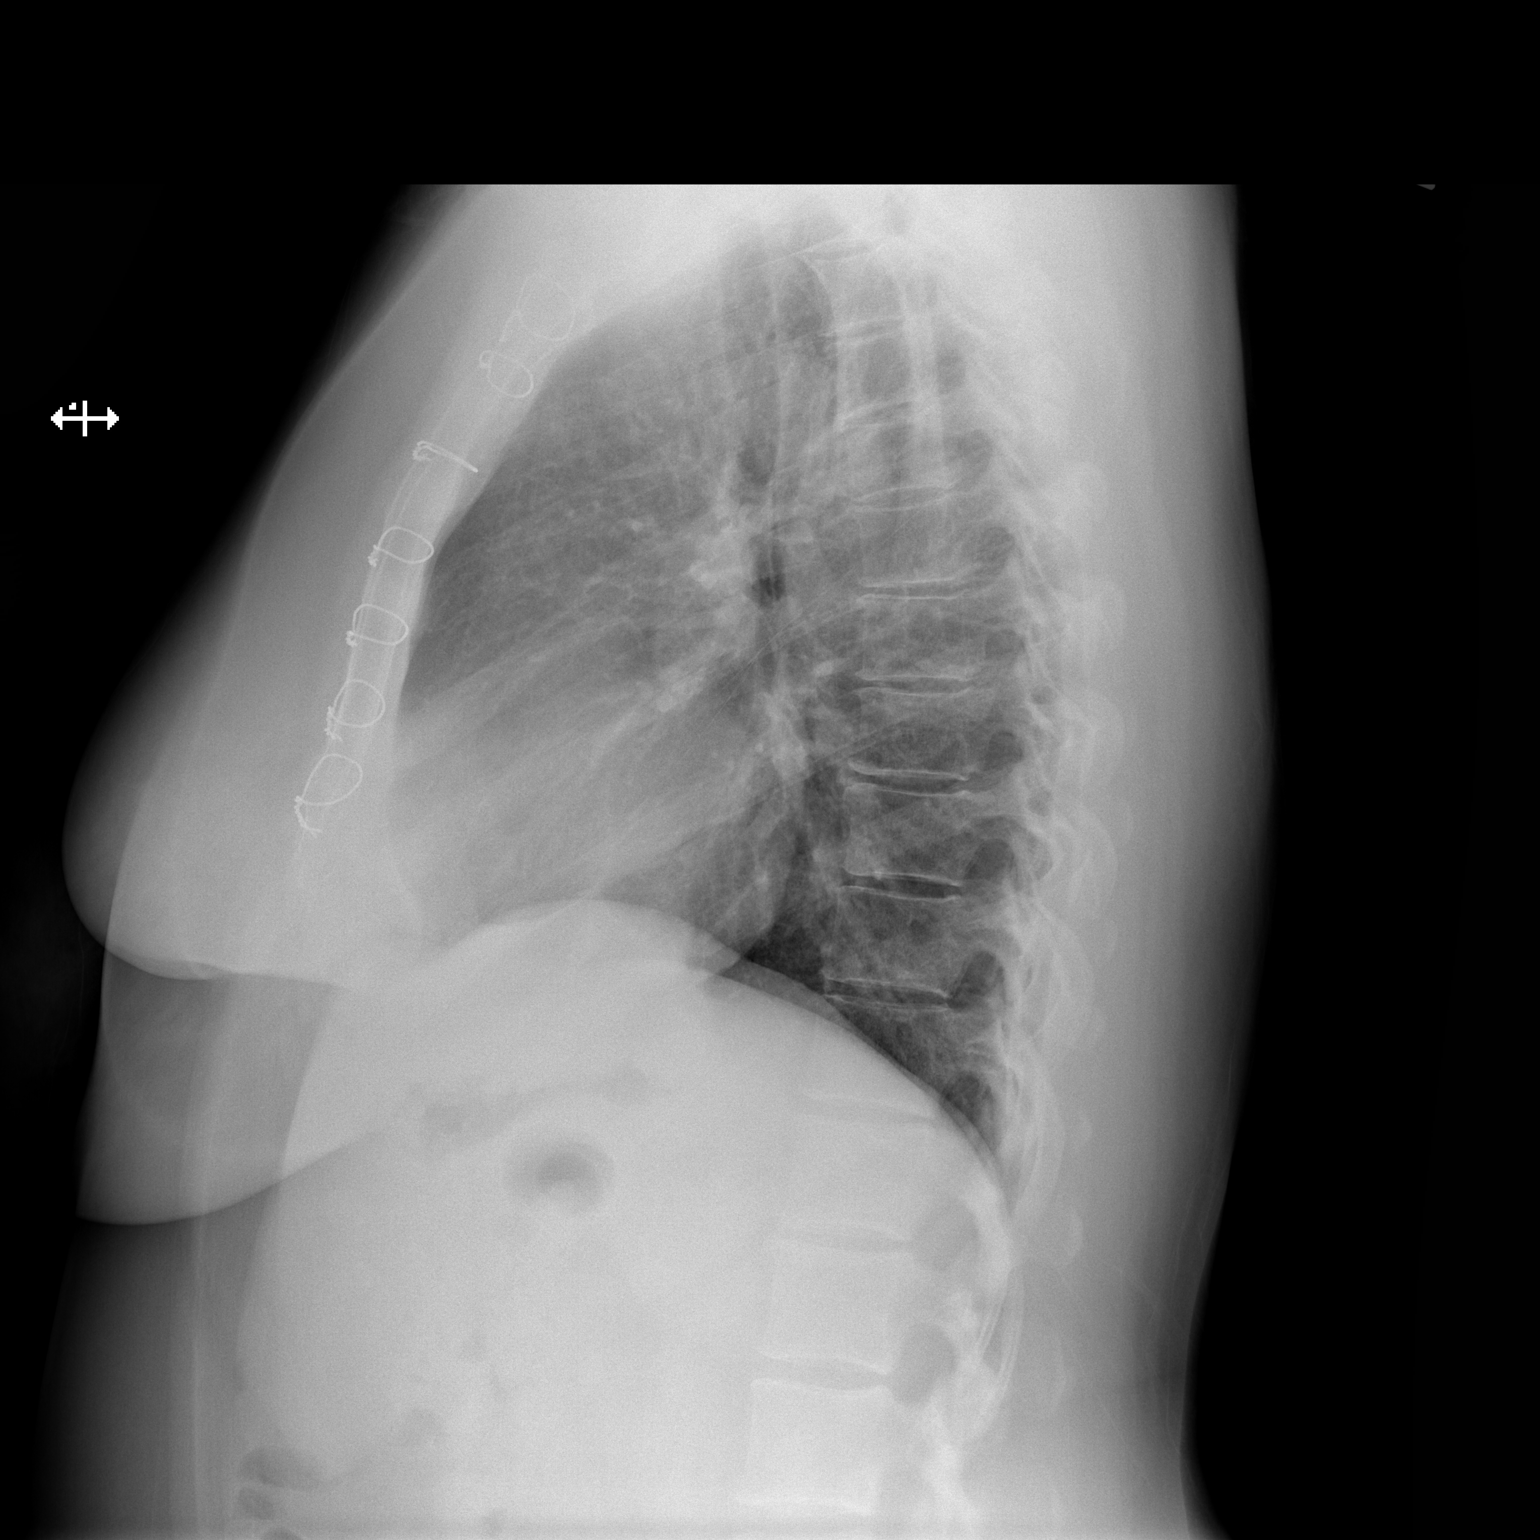

[2 of 2 positions shown; findings below may reference images not displayed]

FINDINGS: Mediastinum hilar structures normal. Lungs are clear. Heart size
normal. Prior CABG. No acute cardiopulmonary disease. Degenerative
change thoracic spine .
IMPRESSION: 1. Prior median sternotomy.  Cardiomegaly.
2. No acute pulmonary disease.

## 2016-11-11 ENCOUNTER — Other Ambulatory Visit: Payer: Self-pay | Admitting: Cardiology

## 2016-11-11 IMAGING — DX DG CHEST 1V PORT
1 series · 1 of 1 positions shown · non-contrast
Comparison: January 15, 2015

CLINICAL DATA: Aortic valve disease, status post aortic valve
replacement

EXAM:
PORTABLE CHEST 1 VIEW

[chest ap]
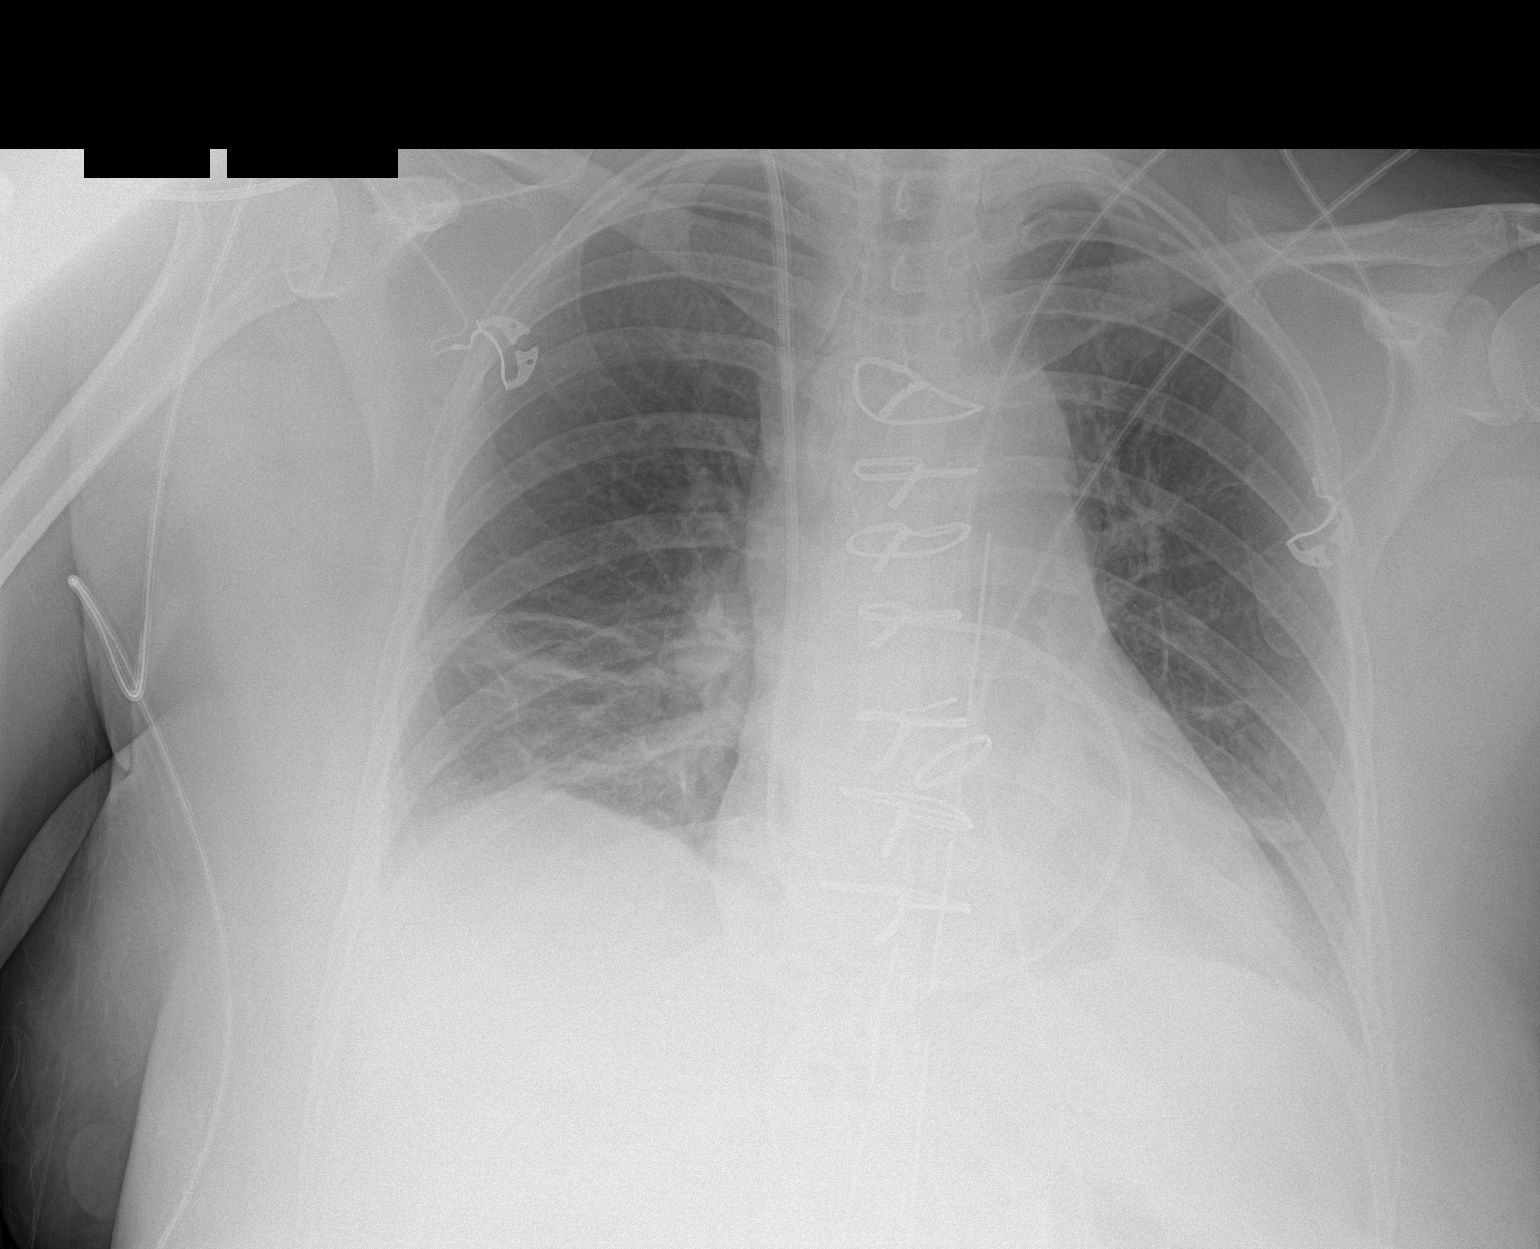

[1 of 1 positions shown; findings below may reference images not displayed]

FINDINGS: The endotracheal tube and nasogastric tube have been removed.
Mediastinal drains remain in place as does a Swan-Ganz catheter. The
tip of the Swan-Ganz catheter is in the right main pulmonary artery.
No pneumothorax. Patchy atelectasis is noted in both lower lobes,
more on the right than on the left, stable. No new opacity. Heart is
upper normal in size with pulmonary vascularity within normal
limits. No adenopathy. Patient is status post aortic valve
replacement.
IMPRESSION: No pneumothorax. Atelectasis bilaterally is stable. No new opacity.
No change in cardiac silhouette.

## 2016-11-11 MED ORDER — METOPROLOL TARTRATE 25 MG PO TABS: 25.0000 mg | ORAL_TABLET | Freq: Two times a day (BID) | ORAL | 0 refills | Status: DC

## 2016-11-11 NOTE — Telephone Encounter (Signed)
Pt's medication was sent to pt's pharmacy as requested. Confirmation received.  °

## 2016-11-12 ENCOUNTER — Other Ambulatory Visit: Payer: Self-pay | Admitting: Cardiology

## 2016-11-12 IMAGING — CR DG CHEST 1V PORT
1 series · 1 of 1 positions shown · non-contrast
Comparison: 01/16/2015 01/15/2015 01/13/2015, CT 11/18/2014

CLINICAL DATA: 44-year-old female with a history of aortic valve
replacement. Surgery 01/15/2015, with redo median sternotomy,
replacement ascending aortic aneurysm with Hemashield, Ning Schlueter
valved graft aortic valve, with reimplantation coronary arteries.

EXAM:
PORTABLE CHEST 1 VIEW

[AP]
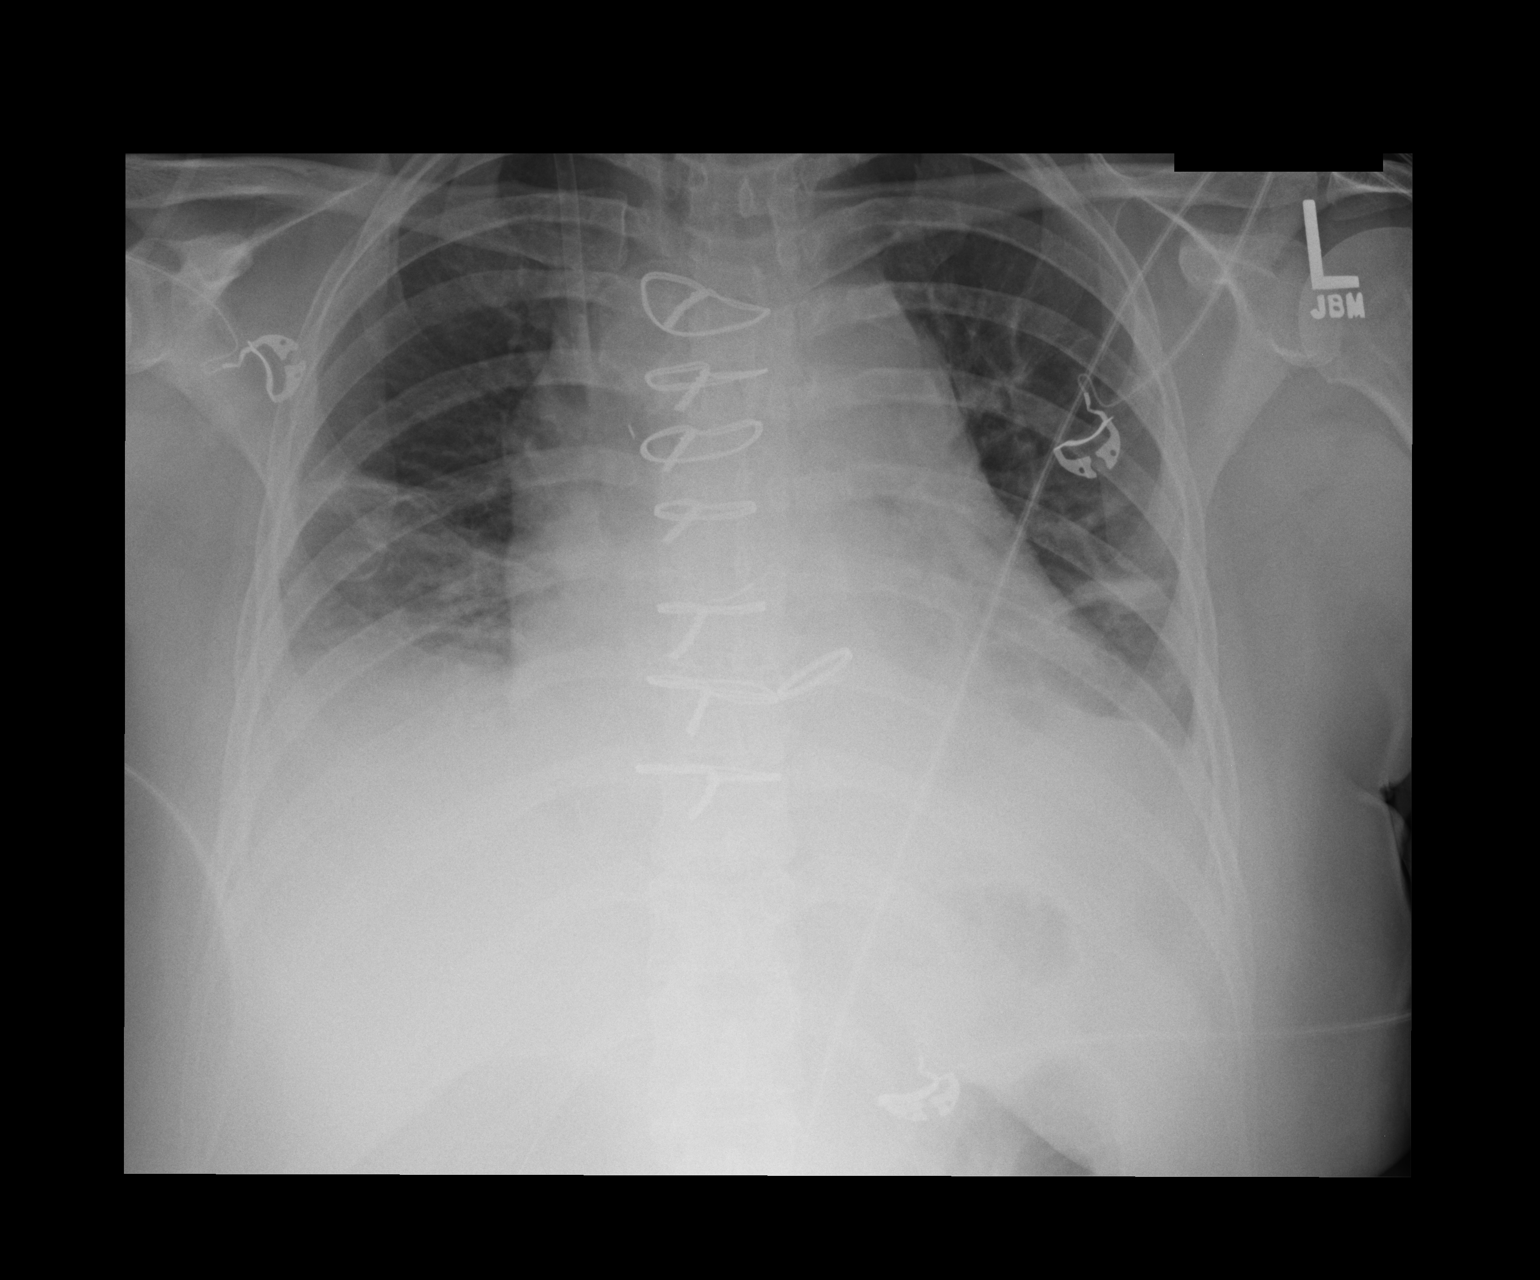

[1 of 1 positions shown; findings below may reference images not displayed]

FINDINGS: Cardiomediastinal silhouette projects slightly widened on the
current chest x-ray, with straightening of the left heart border
compared to the prior. Surgical changes of median sternotomy and
aortic valve replacement.

Lung volumes are decreased from the prior. Status post removal of
endotracheal tube and gastric tube.

Right IJ sheath remains in place with interval removal of the
Swan-Ganz catheter.

Development of opacities at the bilateral lung bases with
obscuration of the hemidiaphragms.

No visualized pneumothorax.

Interval removal of mediastinal/pleural drains.
IMPRESSION: Low lung volumes on the current, with mediastinal diameter
projecting enlarged from the comparison plain film. This may be
secondary to the reduced lung volumes and positioning, however,
developing pericardial fluid cannot be excluded.

Interval removal of Swan-Ganz catheter, endotracheal tube, gastric
tube, mediastinal/pleural drains.

Interval development of basilar opacities, possibly a combination of
atelectasis and/or pleural fluid.

These results were called by telephone at the time of interpretation
on 01/17/2015 at [DATE] to the nurse Ms Erwin Andres Chandia, who verbally
acknowledged these results.

## 2016-11-15 NOTE — Telephone Encounter (Signed)
Medication Detail    Disp Refills Start End   metoprolol tartrate (LOPRESSOR) 25 MG tablet 30 tablet 0 11/11/2016    Sig - Route: Take 1 tablet (25 mg total) by mouth 2 (two) times daily. - Oral   Sent to pharmacy as: metoprolol tartrate (LOPRESSOR) 25 MG tablet   Notes to Pharmacy: Please call our office to schedule an overdue yearly appointment with Dr. Radford Pax before anymore refills. [phone]. Thank you 2nd attempt   E-Prescribing Status: Receipt confirmed by pharmacy (11/11/2016 9:40 AM EDT)   Pharmacy   [id number] - [place], [place] - [address]

## 2016-11-18 ENCOUNTER — Ambulatory Visit (INDEPENDENT_AMBULATORY_CARE_PROVIDER_SITE_OTHER): Payer: PRIVATE HEALTH INSURANCE | Admitting: Cardiology

## 2016-11-18 ENCOUNTER — Encounter: Payer: Self-pay | Admitting: Cardiology

## 2016-11-18 VITALS — BP 110/70 | HR 74 | Ht 64.0 in | Wt 187.8 lb

## 2016-11-18 DIAGNOSIS — Z952 Presence of prosthetic heart valve: Secondary | ICD-10-CM | POA: Diagnosis not present

## 2016-11-18 MED ORDER — METOPROLOL TARTRATE 25 MG PO TABS: 25.0000 mg | ORAL_TABLET | Freq: Two times a day (BID) | ORAL | 3 refills | Status: DC

## 2016-11-18 NOTE — Patient Instructions (Signed)
Medication Instructions: Your physician recommends that you continue on your current medications as directed. Please refer to the Current Medication list given to you today.  Labwork: None Ordered  Procedures/Testing: None Ordered  Follow-Up: Your physician wants you to follow-up in: 1 YEAR with Dr. Radford Pax. You will receive a reminder letter in the mail two months in advance. If you don't receive a letter, please call our office to schedule the follow-up appointment.   If you need a refill on your cardiac medications before your next appointment, please call your pharmacy.
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
## 2016-11-24 ENCOUNTER — Ambulatory Visit (INDEPENDENT_AMBULATORY_CARE_PROVIDER_SITE_OTHER): Payer: PRIVATE HEALTH INSURANCE | Admitting: Family Medicine

## 2016-11-24 VITALS — BP 110/72 | HR 59 | Temp 98.1°F | Ht 64.0 in | Wt 181.0 lb

## 2016-11-24 DIAGNOSIS — E669 Obesity, unspecified: Secondary | ICD-10-CM | POA: Diagnosis not present

## 2016-11-24 DIAGNOSIS — Z6831 Body mass index (BMI) 31.0-31.9, adult: Secondary | ICD-10-CM

## 2016-11-24 DIAGNOSIS — R7303 Prediabetes: Secondary | ICD-10-CM
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
## 2016-12-02 ENCOUNTER — Ambulatory Visit (INDEPENDENT_AMBULATORY_CARE_PROVIDER_SITE_OTHER): Payer: PRIVATE HEALTH INSURANCE | Admitting: *Deleted

## 2016-12-02 DIAGNOSIS — Z5181 Encounter for therapeutic drug level monitoring: Secondary | ICD-10-CM | POA: Diagnosis not present

## 2016-12-02 DIAGNOSIS — I351 Nonrheumatic aortic (valve) insufficiency: Secondary | ICD-10-CM

## 2016-12-02 DIAGNOSIS — Z952 Presence of prosthetic heart valve: Secondary | ICD-10-CM

## 2016-12-02 LAB — POCT INR: INR: 2.5

## 2016-12-05 ENCOUNTER — Encounter: Payer: Self-pay | Admitting: Family Medicine

## 2016-12-05 ENCOUNTER — Ambulatory Visit (INDEPENDENT_AMBULATORY_CARE_PROVIDER_SITE_OTHER): Payer: PRIVATE HEALTH INSURANCE | Admitting: Family Medicine

## 2016-12-05 VITALS — BP 122/84 | HR 68 | Temp 98.0°F | Resp 18 | Wt 186.4 lb

## 2016-12-05 DIAGNOSIS — E6609 Other obesity due to excess calories: Secondary | ICD-10-CM

## 2016-12-05 DIAGNOSIS — R339 Retention of urine, unspecified: Secondary | ICD-10-CM | POA: Diagnosis not present

## 2016-12-05 DIAGNOSIS — E785 Hyperlipidemia, unspecified: Secondary | ICD-10-CM

## 2016-12-05 DIAGNOSIS — M67472 Ganglion, left ankle and foot: Secondary | ICD-10-CM

## 2016-12-05 MED ORDER — AMPHETAMINE-DEXTROAMPHETAMINE 20 MG PO TABS: 20.0000 mg | ORAL_TABLET | Freq: Three times a day (TID) | ORAL | 0 refills | Status: DC

## 2016-12-05 NOTE — Patient Instructions (Addendum)
Try Miralax and Benefiber mixed together once or twice daily, 64 oz of clear fluids Non GMO foods are best Acute Urinary Retention, Female Urinary retention means you are unable to pee completely or at all (empty your bladder). Follow these instructions at home:  Drink enough fluids to keep your pee (urine) clear or pale yellow.  If you are sent home with a tube that drains the bladder (catheter), there will be a drainage bag attached to it. There are two types of bags. One is big that you can wear at night without having to empty it. One is smaller and needs to be emptied more often. ? Keep the drainage bag emptied. ? Keep the drainage bag lower than the tube.  Only take medicine as told by your doctor. Contact a doctor if:  You have a low-grade fever.  You have spasms or you are leaking pee when you have spasms. Get help right away if:  You have chills or a fever.  Your catheter stops draining pee.  Your catheter falls out.  You have increased bleeding that does not stop after you have rested and increased the amount of fluids you had been drinking. This information is not intended to replace advice given to you by your health care provider. Make sure you discuss any questions you have with your health care provider. Document Released: 08/17/2007 Document Revised: 08/06/2015 Document Reviewed: 08/09/2012 Elsevier Interactive Patient Education  2017 [employer].
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
## 2016-12-11 DIAGNOSIS — M67472 Ganglion, left ankle and foot: Secondary | ICD-10-CM | POA: Insufficient documentation

## 2016-12-11 NOTE — Assessment & Plan Note (Signed)
Referred to podiatry for further intervention

## 2016-12-11 NOTE — Assessment & Plan Note (Signed)
Encouraged DASH diet, decrease po intake and increase exercise as tolerated. Needs 7-8 hours of sleep nightly. Avoid trans fats, eat small, frequent meals every 4-5 hours with lean proteins, complex carbs and healthy fats. Minimize simple carbs, GMO foods. Consider bariatric referral

## 2016-12-11 NOTE — Assessment & Plan Note (Signed)
Encouraged heart healthy diet, increase exercise, avoid trans fats, consider a krill oil cap daily 

## 2016-12-13 ENCOUNTER — Encounter (INDEPENDENT_AMBULATORY_CARE_PROVIDER_SITE_OTHER): Payer: Self-pay

## 2016-12-13 ENCOUNTER — Ambulatory Visit (INDEPENDENT_AMBULATORY_CARE_PROVIDER_SITE_OTHER): Payer: PRIVATE HEALTH INSURANCE | Admitting: Family Medicine

## 2016-12-19 ENCOUNTER — Other Ambulatory Visit (INDEPENDENT_AMBULATORY_CARE_PROVIDER_SITE_OTHER): Payer: Self-pay | Admitting: Family Medicine

## 2016-12-19 DIAGNOSIS — E559 Vitamin D deficiency, unspecified: Secondary | ICD-10-CM

## 2016-12-19 DIAGNOSIS — R7303 Prediabetes: Secondary | ICD-10-CM

## 2016-12-27 ENCOUNTER — Ambulatory Visit (INDEPENDENT_AMBULATORY_CARE_PROVIDER_SITE_OTHER): Payer: PRIVATE HEALTH INSURANCE | Admitting: Physician Assistant

## 2016-12-27 VITALS — BP 129/82 | HR 62 | Temp 97.9°F | Ht 64.0 in | Wt 182.0 lb

## 2016-12-27 DIAGNOSIS — E559 Vitamin D deficiency, unspecified: Secondary | ICD-10-CM | POA: Diagnosis not present

## 2016-12-27 DIAGNOSIS — E669 Obesity, unspecified: Secondary | ICD-10-CM

## 2016-12-27 DIAGNOSIS — Z6831 Body mass index (BMI) 31.0-31.9, adult: Secondary | ICD-10-CM | POA: Diagnosis not present

## 2016-12-27 DIAGNOSIS — E8881 Metabolic syndrome: Secondary | ICD-10-CM | POA: Diagnosis not present

## 2016-12-27 DIAGNOSIS — Z9189 Other specified personal risk factors, not elsewhere classified: Secondary | ICD-10-CM | POA: Diagnosis not present

## 2016-12-27 MED ORDER — METFORMIN HCL 500 MG PO TABS: 500.0000 mg | ORAL_TABLET | Freq: Every day | ORAL | 0 refills | Status: DC

## 2016-12-27 MED ORDER — VITAMIN D (ERGOCALCIFEROL) 1.25 MG (50000 UNIT) PO CAPS: 50000.0000 [IU] | ORAL_CAPSULE | ORAL | 0 refills | Status: DC
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
## 2017-01-14 ENCOUNTER — Other Ambulatory Visit: Payer: Self-pay | Admitting: Family Medicine

## 2017-01-17 ENCOUNTER — Ambulatory Visit (INDEPENDENT_AMBULATORY_CARE_PROVIDER_SITE_OTHER): Payer: PRIVATE HEALTH INSURANCE | Admitting: *Deleted

## 2017-01-17 ENCOUNTER — Encounter (INDEPENDENT_AMBULATORY_CARE_PROVIDER_SITE_OTHER): Payer: Self-pay

## 2017-01-17 ENCOUNTER — Ambulatory Visit (INDEPENDENT_AMBULATORY_CARE_PROVIDER_SITE_OTHER): Payer: PRIVATE HEALTH INSURANCE | Admitting: Physician Assistant

## 2017-01-17 DIAGNOSIS — I351 Nonrheumatic aortic (valve) insufficiency: Secondary | ICD-10-CM

## 2017-01-17 DIAGNOSIS — Z5181 Encounter for therapeutic drug level monitoring: Secondary | ICD-10-CM | POA: Diagnosis not present

## 2017-01-17 DIAGNOSIS — Z952 Presence of prosthetic heart valve: Secondary | ICD-10-CM

## 2017-01-17 LAB — POCT INR: INR: 2.6

## 2017-01-24 ENCOUNTER — Other Ambulatory Visit (INDEPENDENT_AMBULATORY_CARE_PROVIDER_SITE_OTHER): Payer: Self-pay | Admitting: Physician Assistant

## 2017-01-24 DIAGNOSIS — E8881 Metabolic syndrome: Secondary | ICD-10-CM

## 2017-01-25 NOTE — Telephone Encounter (Signed)
Pt last seen 11/8, next OV will be 11/29. Pharmacy last filled the prescription for Metformin on 12/20/16. Called pt and left message asking her to verify if she was out of her medication. Per Gwynneth Albright, she would prefer to wait until her upcoming appointment, however depending on the time off the medication an exception may be made. Will await call back.  Remi Deter, CMA

## 2017-01-26 NOTE — Telephone Encounter (Signed)
Spoke with the patient who states she will be out of her medication for 4 days prior to her next appt. Per Gwynneth Albright we will refill the Metformin on her next office visit. Patient aware and verbalized understanding. April, [place]

## 2017-02-01 ENCOUNTER — Ambulatory Visit (INDEPENDENT_AMBULATORY_CARE_PROVIDER_SITE_OTHER): Payer: PRIVATE HEALTH INSURANCE | Admitting: Physician Assistant

## 2017-02-01 VITALS — BP 142/84 | HR 60 | Temp 98.2°F | Ht 64.0 in | Wt 187.0 lb

## 2017-02-01 DIAGNOSIS — E88819 Insulin resistance, unspecified: Secondary | ICD-10-CM | POA: Insufficient documentation

## 2017-02-01 DIAGNOSIS — E8881 Metabolic syndrome: Secondary | ICD-10-CM | POA: Insufficient documentation

## 2017-02-01 DIAGNOSIS — E7849 Other hyperlipidemia: Secondary | ICD-10-CM | POA: Insufficient documentation

## 2017-02-01 DIAGNOSIS — R030 Elevated blood-pressure reading, without diagnosis of hypertension: Secondary | ICD-10-CM

## 2017-02-01 DIAGNOSIS — R7989 Other specified abnormal findings of blood chemistry: Secondary | ICD-10-CM | POA: Diagnosis not present

## 2017-02-01 DIAGNOSIS — E559 Vitamin D deficiency, unspecified: Secondary | ICD-10-CM

## 2017-02-01 DIAGNOSIS — Z9189 Other specified personal risk factors, not elsewhere classified: Secondary | ICD-10-CM

## 2017-02-01 DIAGNOSIS — Z6832 Body mass index (BMI) 32.0-32.9, adult: Secondary | ICD-10-CM | POA: Diagnosis not present

## 2017-02-01 DIAGNOSIS — E669 Obesity, unspecified: Secondary | ICD-10-CM

## 2017-02-01 MED ORDER — METFORMIN HCL 500 MG PO TABS: 500.0000 mg | ORAL_TABLET | Freq: Every day | ORAL | 0 refills | Status: DC

## 2017-02-01 MED ORDER — VITAMIN D (ERGOCALCIFEROL) 1.25 MG (50000 UNIT) PO CAPS: 50000.0000 [IU] | ORAL_CAPSULE | ORAL | 0 refills | Status: DC
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
## 2017-02-02 LAB — COMPREHENSIVE METABOLIC PANEL
ALBUMIN: 4.3 g/dL (ref 3.5–5.5)
ALK PHOS: 49 IU/L (ref 39–117)
ALT: 17 IU/L (ref 0–32)
AST: 19 IU/L (ref 0–40)
Albumin/Globulin Ratio: 1.7 (ref 1.2–2.2)
BILIRUBIN TOTAL: 0.3 mg/dL (ref 0.0–1.2)
BUN / CREAT RATIO: 20 (ref 9–23)
BUN: 16 mg/dL (ref 6–24)
CO2: 24 mmol/L (ref 20–29)
CREATININE: 0.81 mg/dL (ref 0.57–1.00)
Calcium: 9.1 mg/dL (ref 8.7–10.2)
Chloride: 104 mmol/L (ref 96–106)
GFR calc Af Amer: 100 mL/min/{1.73_m2} (ref >59)
GFR calc non Af Amer: 87 mL/min/{1.73_m2} (ref >59)
GLOBULIN, TOTAL: 2.5 g/dL (ref 1.5–4.5)
Glucose: 103 mg/dL — ABNORMAL HIGH (ref 65–99)
Potassium: 4.6 mmol/L (ref 3.5–5.2)
SODIUM: 141 mmol/L (ref 134–144)
Total Protein: 6.8 g/dL (ref 6.0–8.5)

## 2017-02-02 LAB — T3: T3 TOTAL: 106 ng/dL (ref 71–180)

## 2017-02-02 LAB — LIPID PANEL WITH LDL/HDL RATIO
Cholesterol, Total: 163 mg/dL (ref 100–199)
HDL: 42 mg/dL (ref >39)
LDL CALC: 93 mg/dL (ref 0–99)
LDL/HDL RATIO: 2.2 ratio (ref 0.0–3.2)
TRIGLYCERIDES: 142 mg/dL (ref 0–149)
VLDL Cholesterol Cal: 28 mg/dL (ref 5–40)

## 2017-02-02 LAB — T4, FREE: FREE T4: 1.11 ng/dL (ref 0.82–1.77)

## 2017-02-02 LAB — VITAMIN D 25 HYDROXY (VIT D DEFICIENCY, FRACTURES): Vit D, 25-Hydroxy: 30.9 ng/mL (ref 30.0–100.0)

## 2017-02-02 LAB — VITAMIN B12: VITAMIN B 12: 1186 pg/mL (ref 232–1245)

## 2017-02-02 LAB — INSULIN, RANDOM: INSULIN: 6.5 u[IU]/mL (ref 2.6–24.9)

## 2017-02-02 LAB — HEMOGLOBIN A1C
Est. average glucose Bld gHb Est-mCnc: 103 mg/dL
HEMOGLOBIN A1C: 5.2 % (ref 4.8–5.6)

## 2017-02-02 LAB — TSH: TSH: 3.54 u[IU]/mL (ref 0.450–4.500)

## 2017-02-20 ENCOUNTER — Ambulatory Visit (INDEPENDENT_AMBULATORY_CARE_PROVIDER_SITE_OTHER): Payer: PRIVATE HEALTH INSURANCE | Admitting: Physician Assistant

## 2017-02-22 ENCOUNTER — Encounter: Payer: Self-pay | Admitting: Family Medicine

## 2017-02-26 ENCOUNTER — Encounter: Payer: Self-pay | Admitting: Family Medicine

## 2017-02-27 MED ORDER — AMPHETAMINE-DEXTROAMPHETAMINE 20 MG PO TABS: 20.0000 mg | ORAL_TABLET | Freq: Three times a day (TID) | ORAL | 0 refills | Status: DC

## 2017-02-27 MED FILL — AMPHETAMINE-DEXTRO 20MG: 20 | 30 days supply | Qty: 90 | Fill #0

## 2017-02-27 NOTE — Telephone Encounter (Signed)
Rx's for December 2018, January 2019 and February 2019 printed, awaiting MD signature.

## 2017-02-27 NOTE — Telephone Encounter (Signed)
Please advise 

## 2017-02-27 NOTE — Telephone Encounter (Signed)
Mosie Lukes, MD  Damita Dunnings, CMA  Cc: [name], RMA        I do not believe I saw the first request but as long as her UDS and contract are up to date. Please fill her Adderall as requested 30 day supply and 2 rf if she has been seen in last 6 months, if not seen only 30 day supply til seen

## 2017-02-27 NOTE — Telephone Encounter (Signed)
Pt informed via MyChart that Rx's have been placed at front desk for pick up at her convenience.

## 2017-02-27 NOTE — Addendum Note (Signed)
Addended byDamita Dunnings D on: 02/27/2017 01:09 PM   Modules accepted: Orders

## 2017-02-28 ENCOUNTER — Ambulatory Visit (INDEPENDENT_AMBULATORY_CARE_PROVIDER_SITE_OTHER): Payer: PRIVATE HEALTH INSURANCE | Admitting: Physician Assistant

## 2017-02-28 ENCOUNTER — Ambulatory Visit (INDEPENDENT_AMBULATORY_CARE_PROVIDER_SITE_OTHER): Payer: PRIVATE HEALTH INSURANCE | Admitting: *Deleted

## 2017-02-28 VITALS — BP 120/75 | HR 66 | Temp 98.0°F | Ht 64.0 in | Wt 191.0 lb

## 2017-02-28 DIAGNOSIS — Z5181 Encounter for therapeutic drug level monitoring: Secondary | ICD-10-CM

## 2017-02-28 DIAGNOSIS — E669 Obesity, unspecified: Secondary | ICD-10-CM | POA: Diagnosis not present

## 2017-02-28 DIAGNOSIS — I351 Nonrheumatic aortic (valve) insufficiency: Secondary | ICD-10-CM

## 2017-02-28 DIAGNOSIS — Z952 Presence of prosthetic heart valve: Secondary | ICD-10-CM | POA: Diagnosis not present

## 2017-02-28 DIAGNOSIS — E8881 Metabolic syndrome: Secondary | ICD-10-CM

## 2017-02-28 DIAGNOSIS — Z9189 Other specified personal risk factors, not elsewhere classified: Secondary | ICD-10-CM

## 2017-02-28 DIAGNOSIS — E559 Vitamin D deficiency, unspecified: Secondary | ICD-10-CM | POA: Diagnosis not present

## 2017-02-28 DIAGNOSIS — Z6832 Body mass index (BMI) 32.0-32.9, adult: Secondary | ICD-10-CM

## 2017-02-28 LAB — POCT INR: INR: 3

## 2017-02-28 MED ORDER — METFORMIN HCL 500 MG PO TABS: 500.0000 mg | ORAL_TABLET | Freq: Two times a day (BID) | ORAL | 0 refills | Status: DC

## 2017-02-28 MED ORDER — VITAMIN D (ERGOCALCIFEROL) 1.25 MG (50000 UNIT) PO CAPS: 50000.0000 [IU] | ORAL_CAPSULE | ORAL | 0 refills | Status: DC
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
## 2017-02-28 NOTE — Patient Instructions (Signed)
Description   Continue taking 1 tablet daily except 1 and 1/2 on Fridays. Recheck INR in 6 weeks.  Call Coumadin clinic with any new medications [phone], fax [phone].

## 2017-03-22 ENCOUNTER — Ambulatory Visit (INDEPENDENT_AMBULATORY_CARE_PROVIDER_SITE_OTHER): Payer: PRIVATE HEALTH INSURANCE | Admitting: Physician Assistant

## 2017-03-22 VITALS — BP 112/73 | HR 67 | Temp 98.1°F | Ht 64.0 in | Wt 186.0 lb

## 2017-03-22 DIAGNOSIS — Z9189 Other specified personal risk factors, not elsewhere classified: Secondary | ICD-10-CM

## 2017-03-22 DIAGNOSIS — Z6832 Body mass index (BMI) 32.0-32.9, adult: Secondary | ICD-10-CM

## 2017-03-22 DIAGNOSIS — E8881 Metabolic syndrome: Secondary | ICD-10-CM

## 2017-03-22 DIAGNOSIS — E559 Vitamin D deficiency, unspecified: Secondary | ICD-10-CM

## 2017-03-22 DIAGNOSIS — E669 Obesity, unspecified: Secondary | ICD-10-CM | POA: Diagnosis not present

## 2017-03-22 MED ORDER — VITAMIN D (ERGOCALCIFEROL) 1.25 MG (50000 UNIT) PO CAPS: 50000.0000 [IU] | ORAL_CAPSULE | ORAL | 0 refills | Status: DC
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
## 2017-04-10 ENCOUNTER — Ambulatory Visit (INDEPENDENT_AMBULATORY_CARE_PROVIDER_SITE_OTHER): Payer: PRIVATE HEALTH INSURANCE | Admitting: Physician Assistant

## 2017-04-18 ENCOUNTER — Ambulatory Visit (INDEPENDENT_AMBULATORY_CARE_PROVIDER_SITE_OTHER): Payer: PRIVATE HEALTH INSURANCE | Admitting: Physician Assistant

## 2017-04-18 ENCOUNTER — Other Ambulatory Visit: Payer: Self-pay | Admitting: Family Medicine

## 2017-04-18 ENCOUNTER — Ambulatory Visit (INDEPENDENT_AMBULATORY_CARE_PROVIDER_SITE_OTHER): Payer: No Typology Code available for payment source

## 2017-04-18 VITALS — BP 141/85 | HR 75 | Temp 97.8°F | Ht 64.0 in | Wt 187.0 lb

## 2017-04-18 DIAGNOSIS — R7303 Prediabetes: Secondary | ICD-10-CM

## 2017-04-18 DIAGNOSIS — I351 Nonrheumatic aortic (valve) insufficiency: Secondary | ICD-10-CM | POA: Diagnosis not present

## 2017-04-18 DIAGNOSIS — E559 Vitamin D deficiency, unspecified: Secondary | ICD-10-CM

## 2017-04-18 DIAGNOSIS — E669 Obesity, unspecified: Secondary | ICD-10-CM | POA: Diagnosis not present

## 2017-04-18 DIAGNOSIS — Z9189 Other specified personal risk factors, not elsewhere classified: Secondary | ICD-10-CM

## 2017-04-18 DIAGNOSIS — Z6832 Body mass index (BMI) 32.0-32.9, adult: Secondary | ICD-10-CM | POA: Diagnosis not present

## 2017-04-18 DIAGNOSIS — Z5181 Encounter for therapeutic drug level monitoring: Secondary | ICD-10-CM

## 2017-04-18 DIAGNOSIS — Z952 Presence of prosthetic heart valve: Secondary | ICD-10-CM

## 2017-04-18 LAB — POCT INR: INR: 3.5

## 2017-04-18 MED ORDER — VITAMIN D (ERGOCALCIFEROL) 1.25 MG (50000 UNIT) PO CAPS: 50000.0000 [IU] | ORAL_CAPSULE | ORAL | 0 refills | Status: DC

## 2017-04-18 MED ORDER — METFORMIN HCL 500 MG PO TABS: 500.0000 mg | ORAL_TABLET | Freq: Two times a day (BID) | ORAL | 0 refills | Status: DC
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
## 2017-04-18 NOTE — Patient Instructions (Signed)
Description   Skip tomorrow's dosage of Coumadin, then resume same dosage 1 tablet daily except 1.5 on Fridays. Recheck INR in 4 weeks.  Call Coumadin clinic with any new medications [phone], fax [phone].

## 2017-05-09 ENCOUNTER — Ambulatory Visit (INDEPENDENT_AMBULATORY_CARE_PROVIDER_SITE_OTHER): Payer: PRIVATE HEALTH INSURANCE | Admitting: Physician Assistant

## 2017-05-09 VITALS — BP 108/74 | HR 74 | Temp 98.3°F | Ht 64.0 in | Wt 187.0 lb

## 2017-05-09 DIAGNOSIS — R7303 Prediabetes: Secondary | ICD-10-CM

## 2017-05-09 DIAGNOSIS — Z9189 Other specified personal risk factors, not elsewhere classified: Secondary | ICD-10-CM

## 2017-05-09 DIAGNOSIS — K5909 Other constipation: Secondary | ICD-10-CM | POA: Diagnosis not present

## 2017-05-09 DIAGNOSIS — Z6832 Body mass index (BMI) 32.0-32.9, adult: Secondary | ICD-10-CM | POA: Diagnosis not present

## 2017-05-09 DIAGNOSIS — E669 Obesity, unspecified: Secondary | ICD-10-CM | POA: Diagnosis not present

## 2017-05-09 DIAGNOSIS — E66811 Obesity, class 1: Secondary | ICD-10-CM

## 2017-05-09 MED ORDER — METFORMIN HCL 500 MG PO TABS: 500.0000 mg | ORAL_TABLET | Freq: Two times a day (BID) | ORAL | 0 refills | Status: DC
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
## 2017-05-16 ENCOUNTER — Ambulatory Visit (INDEPENDENT_AMBULATORY_CARE_PROVIDER_SITE_OTHER): Payer: No Typology Code available for payment source | Admitting: *Deleted

## 2017-05-16 DIAGNOSIS — I351 Nonrheumatic aortic (valve) insufficiency: Secondary | ICD-10-CM

## 2017-05-16 DIAGNOSIS — Z952 Presence of prosthetic heart valve: Secondary | ICD-10-CM

## 2017-05-16 DIAGNOSIS — Z5181 Encounter for therapeutic drug level monitoring: Secondary | ICD-10-CM | POA: Diagnosis not present

## 2017-05-16 LAB — POCT INR: INR: 2.7

## 2017-05-16 NOTE — Patient Instructions (Signed)
Description   Continue  same dosage of coumadin  1 tablet daily except 1.5 on Fridays. Recheck INR in 4 weeks Pt states is going to be out of town and will not be back until 6 weeks and is aware of possible complications in not coming back as scheduled .  Call Coumadin clinic with any new medications [phone], fax [phone].

## 2017-05-29 ENCOUNTER — Ambulatory Visit: Payer: No Typology Code available for payment source | Admitting: Family Medicine

## 2017-05-29 VITALS — BP 122/80 | HR 74 | Resp 16 | Ht 64.0 in | Wt 195.4 lb

## 2017-05-29 DIAGNOSIS — Z79899 Other long term (current) drug therapy: Secondary | ICD-10-CM

## 2017-05-29 DIAGNOSIS — E7849 Other hyperlipidemia: Secondary | ICD-10-CM | POA: Diagnosis not present

## 2017-05-29 DIAGNOSIS — E559 Vitamin D deficiency, unspecified: Secondary | ICD-10-CM | POA: Diagnosis not present

## 2017-05-29 DIAGNOSIS — K219 Gastro-esophageal reflux disease without esophagitis: Secondary | ICD-10-CM | POA: Diagnosis not present

## 2017-05-29 DIAGNOSIS — R7303 Prediabetes: Secondary | ICD-10-CM | POA: Diagnosis not present

## 2017-05-29 DIAGNOSIS — F988 Other specified behavioral and emotional disorders with onset usually occurring in childhood and adolescence: Secondary | ICD-10-CM

## 2017-05-29 MED ORDER — METOPROLOL TARTRATE 50 MG PO TABS: 50.0000 mg | ORAL_TABLET | Freq: Two times a day (BID) | ORAL | 1 refills | Status: DC

## 2017-05-29 MED ORDER — AMPHETAMINE-DEXTROAMPHETAMINE 20 MG PO TABS: 20.0000 mg | ORAL_TABLET | Freq: Three times a day (TID) | ORAL | 0 refills | Status: DC

## 2017-05-29 MED FILL — DEXTROAMP-AMPHETAMIN 20 MG: 20 | 30 days supply | Qty: 90 | Fill #0

## 2017-05-29 NOTE — Assessment & Plan Note (Signed)
Pap today, no concerns on exam.  

## 2017-05-29 NOTE — Patient Instructions (Addendum)
For congestion Mucinex plain 1 tab twice daily x 10 days Nasal saline flushes 2-3 x daily  Flonase/Fluticasone nasal, 2 sprays each nostril daily Zyrtec/Cetirizine 10 mg daily  maintain increased rest and hydration, add probiotics, zinc such as Coldeze or Xicam. Treat fevers as needed Consider Vitamin C 500 daily, Elderberry daily   Headache and Arthritis If you have arthritis and headaches, it is possible the two problems are related. Some headaches can be caused by arthritis in your neck (cervicogenic headaches). Pain medicine is another possible link between arthritis and headache. If you take a lot of over-the-counter medicines for arthritis pain, you may develop the type of headache that can happen when you stop taking your over-the-counter pain reliever or lower your dose too quickly (rebound headache). What types of arthritis can cause a headache? There are two types of arthritis, rheumatoid arthritis and osteoarthritis. Both types of arthritis can cause headaches.  Rheumatoid arthritis (RA) is an autoimmune disease that causes inflammation of your joints. When you have RA, your body's defense system (immune system) attacks the joints of your body and causes inflammation. This can lead to deformity over time.  Osteoarthritis (OA) is wear and tear caused by joint use over time. Osteoarthritis is not an inflammatory disease.  Both OA and RA can cause neck pain that is felt in the head. When the pain is felt in a different location than it originates, it is called radiating or referred pain. This pain is usually felt in the back of the head. How are headaches and arthritis related? RA can affect any joint in the body, including the joints between the bones of the neck (cervical vertebrae). The neck joints most commonly affected by RA are the top two joints, between the first and second cervical vertebra. Inflammation in these joints may be felt as neck pain and head pain. OA of the neck may  be caused by gradual wear and tear or by a neck injury. Neck vertebrae may develop calcium deposits in the areas where muscle attach. Wear and tear of the vertebra may cause pressure on the nerves that leave the spinal cord. These changes can cause referred pain that may be felt as a headache. How are headaches associated with arthritis diagnosed?  Your health care provider may diagnose headache caused by RA if you have inflammation of vertebrae in your neck. You may have: ? Blood tests to measure how much inflammation you have. ? Imaging studies of your neck (MRI) to check for inflammation of cervical vertebrae.  Your health care provider may diagnose headache caused by OA if an X-ray shows: ? Calcium deposits. ? Bone spurs. ? Narrowing of the space between neck vertebrae.  Your health care provider may diagnose rebound headache if you have a history of using over-the-counter pain relievers frequently. When should I seek care for my headaches? Call your health care provider if:  You have more than three headaches per week.  You take an over-the-counter pain reliever almost every day.  Your headaches are getting worse and happening more often.  You have headache with fever.  You have headache with numbness, weakness, or dizziness.  You have headache with nausea or vomiting.  What are my treatment options?  If you have headache caused by RA, treatment may include: ? Over-the-counter or prescription-strength anti-inflammatory medicines. ? Disease-modifying antirheumatic drugs (DMARDs). These medicines slow or stop the progression of RA.  If you have headache caused by OA, treatment may include: ? Over-the-counter pain medicines. ?  Heat or massage. ? Physical therapy.  If you have rebound headaches: ? They will usually go away within several days of stopping the medicine that caused them. ? You may be able to gradually reduce the amount of medicines you take to prevent  headache. ? Ask your health care provider if you can take another type of medicine instead. This information is not intended to replace advice given to you by your health care provider. Make sure you discuss any questions you have with your health care provider. Document Released: 05/21/2003 Document Revised: 08/06/2015 Document Reviewed: 06/03/2013 Elsevier Interactive Patient Education  2018 [employer].

## 2017-05-29 NOTE — Assessment & Plan Note (Signed)
Check level today 

## 2017-05-29 NOTE — Assessment & Plan Note (Signed)
Encouraged heart healthy diet, increase exercise, avoid trans fats, consider a krill oil cap daily 

## 2017-05-29 NOTE — Assessment & Plan Note (Signed)
Avoid offending foods, start probiotics. Do not eat large meals in late evening and consider raising head of bed. Check labs

## 2017-05-30 LAB — COMPREHENSIVE METABOLIC PANEL
ALT: 19 U/L (ref 0–35)
AST: 21 U/L (ref 0–37)
Albumin: 4.2 g/dL (ref 3.5–5.2)
Alkaline Phosphatase: 34 U/L — ABNORMAL LOW (ref 39–117)
BUN: 15 mg/dL (ref 6–23)
CO2: 29 mEq/L (ref 19–32)
Calcium: 9.6 mg/dL (ref 8.4–10.5)
Chloride: 100 mEq/L (ref 96–112)
Creatinine, Ser: 0.75 mg/dL (ref 0.40–1.20)
GFR: 87.77 mL/min (ref >60.00)
GLUCOSE: 101 mg/dL — AB (ref 70–99)
POTASSIUM: 3.9 meq/L (ref 3.5–5.1)
SODIUM: 137 meq/L (ref 135–145)
Total Bilirubin: 0.5 mg/dL (ref 0.2–1.2)
Total Protein: 7.1 g/dL (ref 6.0–8.3)

## 2017-05-30 LAB — CBC
HEMATOCRIT: 38.7 % (ref 36.0–46.0)
Hemoglobin: 12.9 g/dL (ref 12.0–15.0)
MCHC: 33.2 g/dL (ref 30.0–36.0)
MCV: 97.4 fl (ref 78.0–100.0)
Platelets: 256 10*3/uL (ref 150.0–400.0)
RBC: 3.98 Mil/uL (ref 3.87–5.11)
RDW: 13.1 % (ref 11.5–15.5)
WBC: 8.3 10*3/uL (ref 4.0–10.5)

## 2017-05-30 LAB — T4, FREE: Free T4: 0.7 ng/dL (ref 0.60–1.60)

## 2017-05-30 LAB — LIPID PANEL
Cholesterol: 170 mg/dL (ref 0–200)
HDL: 46.2 mg/dL (ref >39.00)
NonHDL: 124.21
Total CHOL/HDL Ratio: 4
Triglycerides: 292 mg/dL — ABNORMAL HIGH (ref 0.0–149.0)
VLDL: 58.4 mg/dL — AB (ref 0.0–40.0)

## 2017-05-30 LAB — HEMOGLOBIN A1C: Hgb A1c MFr Bld: 5.6 % (ref 4.6–6.5)

## 2017-05-30 LAB — VITAMIN D 25 HYDROXY (VIT D DEFICIENCY, FRACTURES): VITD: 41.48 ng/mL (ref 30.00–100.00)

## 2017-05-30 LAB — LDL CHOLESTEROL, DIRECT: Direct LDL: 91 mg/dL

## 2017-05-30 LAB — TSH: TSH: 4.39 u[IU]/mL (ref 0.35–4.50)
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
## 2017-05-31 NOTE — Assessment & Plan Note (Signed)
Doing well on current meds given refills today

## 2017-06-02 LAB — PAIN MGMT, PROFILE 8 W/CONF, U
6 ACETYLMORPHINE: NEGATIVE ng/mL (ref <10)
ALCOHOL METABOLITES: POSITIVE ng/mL — AB (ref <500)
AMPHETAMINE: 14123 ng/mL — AB (ref <250)
Amphetamines: POSITIVE ng/mL — AB (ref <500)
BENZODIAZEPINES: NEGATIVE ng/mL (ref <100)
Buprenorphine, Urine: NEGATIVE ng/mL (ref <5)
COCAINE METABOLITE: NEGATIVE ng/mL (ref <150)
Creatinine: 76.6 mg/dL
ETHYL GLUCURONIDE (ETG): 2344 ng/mL — AB (ref <500)
Ethyl Sulfate (ETS): 595 ng/mL — ABNORMAL HIGH (ref <100)
MDMA: NEGATIVE ng/mL (ref <500)
METHAMPHETAMINE: NEGATIVE ng/mL (ref <250)
Marijuana Metabolite: NEGATIVE ng/mL (ref <20)
OXYCODONE: NEGATIVE ng/mL (ref <100)
Opiates: NEGATIVE ng/mL (ref <100)
Oxidant: NEGATIVE ug/mL (ref <200)
pH: 6.42 (ref 4.5–9.0)

## 2017-06-05 ENCOUNTER — Encounter: Payer: Self-pay | Admitting: Family Medicine

## 2017-06-27 ENCOUNTER — Other Ambulatory Visit: Payer: Self-pay | Admitting: Family Medicine

## 2017-06-27 ENCOUNTER — Ambulatory Visit (INDEPENDENT_AMBULATORY_CARE_PROVIDER_SITE_OTHER): Payer: No Typology Code available for payment source | Admitting: *Deleted

## 2017-06-27 DIAGNOSIS — F988 Other specified behavioral and emotional disorders with onset usually occurring in childhood and adolescence: Secondary | ICD-10-CM

## 2017-06-27 DIAGNOSIS — Z952 Presence of prosthetic heart valve: Secondary | ICD-10-CM

## 2017-06-27 DIAGNOSIS — Z5181 Encounter for therapeutic drug level monitoring: Secondary | ICD-10-CM | POA: Diagnosis not present

## 2017-06-27 DIAGNOSIS — I351 Nonrheumatic aortic (valve) insufficiency: Secondary | ICD-10-CM

## 2017-06-27 LAB — POCT INR: INR: 3.7

## 2017-06-27 NOTE — Patient Instructions (Signed)
Description   Do not take any Coumadin tomorrow then continue same dosage of coumadin 1 tablet daily except 1.5 tablets on Fridays. Recheck INR in 3 weeks.  Call Coumadin clinic with any new medications [phone], fax [phone].

## 2017-06-29 ENCOUNTER — Telehealth: Payer: Self-pay | Admitting: Family Medicine

## 2017-06-29 ENCOUNTER — Other Ambulatory Visit: Payer: Self-pay | Admitting: Family Medicine

## 2017-06-29 DIAGNOSIS — F988 Other specified behavioral and emotional disorders with onset usually occurring in childhood and adolescence: Secondary | ICD-10-CM

## 2017-06-29 MED ORDER — AMPHETAMINE-DEXTROAMPHETAMINE 20 MG PO TABS: 20.0000 mg | ORAL_TABLET | Freq: Three times a day (TID) | ORAL | 0 refills | Status: DC

## 2017-06-29 NOTE — Addendum Note (Signed)
Addended by: [name] A on: 06/29/2017 03:24 PM   Modules accepted: Orders

## 2017-06-29 NOTE — Telephone Encounter (Signed)
Last OV:05/29/17 PCP: [place]: [id number] [place], [place] - [address] [phone] (Phone) [phone] (Fax)

## 2017-06-29 NOTE — Telephone Encounter (Signed)
Copied from [place] [id number]. Topic: Quick Communication - See Telephone Encounter >> Jun 29, 2017  3:56 PM Conception Chancy, NT wrote: CRM for notification. See Telephone encounter for: 06/29/17.  Patient states that her amphetamine-dextroamphetamine (ADDERALL) 20 MG tablet was sent down to the wrong pharmacy. The 2 prescriptions for amphetamine-dextroamphetamine (ADDERALL) 20 MG [facility]   [id number] [place], [place] - [address] [place] [address] Phone: [phone] Fax: [phone]

## 2017-06-29 NOTE — Telephone Encounter (Signed)
Requesting:adderall  Contract:yes UDS:low risk next screen 10/30/17 Last OV:05/26/17 Next OV:not schedule  Last Refill: Database:   Please advise

## 2017-06-29 NOTE — Telephone Encounter (Signed)
See patient message

## 2017-07-16 ENCOUNTER — Other Ambulatory Visit: Payer: Self-pay | Admitting: Family Medicine

## 2017-07-18 ENCOUNTER — Ambulatory Visit (INDEPENDENT_AMBULATORY_CARE_PROVIDER_SITE_OTHER): Payer: No Typology Code available for payment source | Admitting: *Deleted

## 2017-07-18 DIAGNOSIS — I351 Nonrheumatic aortic (valve) insufficiency: Secondary | ICD-10-CM | POA: Diagnosis not present

## 2017-07-18 DIAGNOSIS — Z952 Presence of prosthetic heart valve: Secondary | ICD-10-CM | POA: Diagnosis not present

## 2017-07-18 DIAGNOSIS — Z5181 Encounter for therapeutic drug level monitoring: Secondary | ICD-10-CM

## 2017-07-18 LAB — POCT INR: INR: 4.2

## 2017-07-18 NOTE — Patient Instructions (Signed)
Description   Skip tomorrow's dose, then start taking 1 tablet daily.  Recheck INR in 2 weeks.  Call Coumadin clinic with any new medications [phone], fax [phone].

## 2017-07-19 ENCOUNTER — Other Ambulatory Visit: Payer: Self-pay | Admitting: Family Medicine

## 2017-07-20 ENCOUNTER — Other Ambulatory Visit: Payer: Self-pay | Admitting: Family Medicine

## 2017-07-20 DIAGNOSIS — E785 Hyperlipidemia, unspecified: Secondary | ICD-10-CM

## 2017-07-20 DIAGNOSIS — R739 Hyperglycemia, unspecified: Secondary | ICD-10-CM

## 2017-07-20 DIAGNOSIS — Z0000 Encounter for general adult medical examination without abnormal findings: Secondary | ICD-10-CM

## 2017-08-02 ENCOUNTER — Ambulatory Visit (INDEPENDENT_AMBULATORY_CARE_PROVIDER_SITE_OTHER): Payer: No Typology Code available for payment source | Admitting: *Deleted

## 2017-08-02 DIAGNOSIS — Z5181 Encounter for therapeutic drug level monitoring: Secondary | ICD-10-CM

## 2017-08-02 DIAGNOSIS — Z952 Presence of prosthetic heart valve: Secondary | ICD-10-CM

## 2017-08-02 DIAGNOSIS — I351 Nonrheumatic aortic (valve) insufficiency: Secondary | ICD-10-CM

## 2017-08-02 LAB — POCT INR: INR: 2.3 (ref 2.0–3.0)

## 2017-08-02 NOTE — Patient Instructions (Signed)
Description   Continue same dose of coumadin 1 tablet  (5mg ) daily.  Recheck INR in 3 weeks Pt will be out of town and will be back in 4 weeks and is aware of any possible complications in not coming back as instructed .  Call Coumadin clinic with any new medications [phone], fax [phone].

## 2017-08-30 ENCOUNTER — Ambulatory Visit (INDEPENDENT_AMBULATORY_CARE_PROVIDER_SITE_OTHER): Payer: No Typology Code available for payment source | Admitting: *Deleted

## 2017-08-30 DIAGNOSIS — Z5181 Encounter for therapeutic drug level monitoring: Secondary | ICD-10-CM

## 2017-08-30 DIAGNOSIS — I351 Nonrheumatic aortic (valve) insufficiency: Secondary | ICD-10-CM

## 2017-08-30 DIAGNOSIS — Z952 Presence of prosthetic heart valve: Secondary | ICD-10-CM | POA: Diagnosis not present

## 2017-08-30 LAB — POCT INR: INR: 2.7 (ref 2.0–3.0)

## 2017-08-30 NOTE — Patient Instructions (Signed)
Description   Continue same dose of coumadin 1 tablet  (5mg ) daily.  Recheck INR in 4 weeks. Call Coumadin clinic with any new medications [phone], fax [phone].

## 2017-08-31 ENCOUNTER — Encounter: Payer: Self-pay | Admitting: Family Medicine

## 2017-08-31 DIAGNOSIS — R7989 Other specified abnormal findings of blood chemistry: Secondary | ICD-10-CM

## 2017-08-31 DIAGNOSIS — E559 Vitamin D deficiency, unspecified: Secondary | ICD-10-CM

## 2017-08-31 DIAGNOSIS — Z9189 Other specified personal risk factors, not elsewhere classified: Secondary | ICD-10-CM

## 2017-08-31 DIAGNOSIS — E7849 Other hyperlipidemia: Secondary | ICD-10-CM

## 2017-08-31 DIAGNOSIS — I10 Essential (primary) hypertension: Secondary | ICD-10-CM

## 2017-09-01 MED ORDER — METOPROLOL TARTRATE 50 MG PO TABS: 50.0000 mg | ORAL_TABLET | Freq: Two times a day (BID) | ORAL | 0 refills | Status: DC

## 2017-09-01 MED ORDER — VITAMIN D (ERGOCALCIFEROL) 1.25 MG (50000 UNIT) PO CAPS: 50000.0000 [IU] | ORAL_CAPSULE | ORAL | 0 refills | Status: DC

## 2017-09-21 ENCOUNTER — Other Ambulatory Visit: Payer: Self-pay | Admitting: Family Medicine

## 2017-09-21 DIAGNOSIS — F988 Other specified behavioral and emotional disorders with onset usually occurring in childhood and adolescence: Secondary | ICD-10-CM

## 2017-09-22 NOTE — Telephone Encounter (Signed)
Requesting: adderall 20mg  tid Contract: 2019 UDS: 05/29/17 Last OV: 05/29/17 Next Ov: 09/28/17 Last refill: 06/29/17, 3 separate rx, last one May for #90, 0RF Database: no discrepancies found  Please advise.

## 2017-09-24 ENCOUNTER — Other Ambulatory Visit: Payer: Self-pay | Admitting: Family Medicine

## 2017-09-24 DIAGNOSIS — E559 Vitamin D deficiency, unspecified: Secondary | ICD-10-CM

## 2017-09-24 MED ORDER — AMPHETAMINE-DEXTROAMPHETAMINE 20 MG PO TABS: 20.0000 mg | ORAL_TABLET | Freq: Three times a day (TID) | ORAL | 0 refills | Status: DC

## 2017-09-28 ENCOUNTER — Ambulatory Visit (INDEPENDENT_AMBULATORY_CARE_PROVIDER_SITE_OTHER): Payer: No Typology Code available for payment source | Admitting: *Deleted

## 2017-09-28 DIAGNOSIS — Z5181 Encounter for therapeutic drug level monitoring: Secondary | ICD-10-CM | POA: Diagnosis not present

## 2017-09-28 DIAGNOSIS — Z952 Presence of prosthetic heart valve: Secondary | ICD-10-CM

## 2017-09-28 DIAGNOSIS — I351 Nonrheumatic aortic (valve) insufficiency: Secondary | ICD-10-CM

## 2017-09-28 LAB — POCT INR: INR: 2.4 (ref 2.0–3.0)

## 2017-09-28 NOTE — Patient Instructions (Signed)
Description   Continue same dose of coumadin 1 tablet  (5mg ) daily.  Recheck INR in 4 weeks. Call Coumadin clinic with any new medications [phone], fax [phone].

## 2017-10-05 ENCOUNTER — Other Ambulatory Visit (INDEPENDENT_AMBULATORY_CARE_PROVIDER_SITE_OTHER): Payer: No Typology Code available for payment source

## 2017-10-05 DIAGNOSIS — E7849 Other hyperlipidemia: Secondary | ICD-10-CM | POA: Diagnosis not present

## 2017-10-05 DIAGNOSIS — I10 Essential (primary) hypertension: Secondary | ICD-10-CM | POA: Diagnosis not present

## 2017-10-05 DIAGNOSIS — Z9189 Other specified personal risk factors, not elsewhere classified: Secondary | ICD-10-CM | POA: Diagnosis not present

## 2017-10-05 DIAGNOSIS — E559 Vitamin D deficiency, unspecified: Secondary | ICD-10-CM | POA: Diagnosis not present

## 2017-10-05 LAB — COMPREHENSIVE METABOLIC PANEL
ALT: 24 U/L (ref 0–35)
AST: 21 U/L (ref 0–37)
Albumin: 4.4 g/dL (ref 3.5–5.2)
Alkaline Phosphatase: 40 U/L (ref 39–117)
BILIRUBIN TOTAL: 0.7 mg/dL (ref 0.2–1.2)
BUN: 20 mg/dL (ref 6–23)
CHLORIDE: 101 meq/L (ref 96–112)
CO2: 30 meq/L (ref 19–32)
Calcium: 9.8 mg/dL (ref 8.4–10.5)
Creatinine, Ser: 0.84 mg/dL (ref 0.40–1.20)
GFR: 76.89 mL/min (ref >60.00)
GLUCOSE: 103 mg/dL — AB (ref 70–99)
Potassium: 4.6 mEq/L (ref 3.5–5.1)
Sodium: 137 mEq/L (ref 135–145)
Total Protein: 7.4 g/dL (ref 6.0–8.3)

## 2017-10-05 LAB — LIPID PANEL
Cholesterol: 179 mg/dL (ref 0–200)
HDL: 48.6 mg/dL (ref >39.00)
LDL CALC: 100 mg/dL — AB (ref 0–99)
NONHDL: 130.3
Total CHOL/HDL Ratio: 4
Triglycerides: 152 mg/dL — ABNORMAL HIGH (ref 0.0–149.0)
VLDL: 30.4 mg/dL (ref 0.0–40.0)

## 2017-10-05 LAB — CBC
HCT: 37.5 % (ref 36.0–46.0)
HEMOGLOBIN: 12.7 g/dL (ref 12.0–15.0)
MCHC: 34 g/dL (ref 30.0–36.0)
MCV: 95.1 fl (ref 78.0–100.0)
Platelets: 248 10*3/uL (ref 150.0–400.0)
RBC: 3.94 Mil/uL (ref 3.87–5.11)
RDW: 13.1 % (ref 11.5–15.5)
WBC: 6.9 10*3/uL (ref 4.0–10.5)

## 2017-10-05 LAB — HEMOGLOBIN A1C: Hgb A1c MFr Bld: 5.6 % (ref 4.6–6.5)

## 2017-10-05 LAB — VITAMIN D 25 HYDROXY (VIT D DEFICIENCY, FRACTURES): VITD: 45.04 ng/mL (ref 30.00–100.00)

## 2017-10-22 ENCOUNTER — Other Ambulatory Visit: Payer: Self-pay | Admitting: Family Medicine

## 2017-10-24 ENCOUNTER — Telehealth: Payer: Self-pay | Admitting: *Deleted

## 2017-10-24 DIAGNOSIS — N83201 Unspecified ovarian cyst, right side: Secondary | ICD-10-CM

## 2017-10-24 DIAGNOSIS — D219 Benign neoplasm of connective and other soft tissue, unspecified: Secondary | ICD-10-CM

## 2017-10-24 NOTE — Telephone Encounter (Signed)
Patient called requesting ultrasound, states no cycle in 6 months and c/o bloating and pressure and cramping. States you told repeat ultrasound, annual exam schedule on 12/27/17. Has right ovary  And fibroids. Please advise

## 2017-10-24 NOTE — Telephone Encounter (Signed)
Ok to sched [name], she has a hx of breast ca

## 2017-10-24 NOTE — Telephone Encounter (Signed)
Appointment scheduled for Aug 19.

## 2017-10-24 NOTE — Telephone Encounter (Signed)
Order placed for ultrasound will route to claudia for scheduling.

## 2017-10-24 NOTE — Telephone Encounter (Signed)
Patient called and left message in triage voicemail requesting ultrasound. I left message for patient to call.

## 2017-10-25 ENCOUNTER — Other Ambulatory Visit: Payer: Self-pay | Admitting: Family Medicine

## 2017-10-25 ENCOUNTER — Encounter: Payer: Self-pay | Admitting: Family Medicine

## 2017-10-25 DIAGNOSIS — F988 Other specified behavioral and emotional disorders with onset usually occurring in childhood and adolescence: Secondary | ICD-10-CM

## 2017-10-25 DIAGNOSIS — R7303 Prediabetes: Secondary | ICD-10-CM

## 2017-10-25 MED ORDER — AMPHETAMINE-DEXTROAMPHETAMINE 20 MG PO TABS: 20.0000 mg | ORAL_TABLET | Freq: Three times a day (TID) | ORAL | 0 refills | Status: DC

## 2017-10-25 NOTE — Telephone Encounter (Signed)
Last adderall RX: 09/24/17, #90. Take 1 tablet 3 times daily. Last OV: 05/29/17 Next OV: was due 08/29/17 but now past due UDS: 05/29/17, low risk, due 10/29/17 CSC: 05/29/17 CSR: No discrepancies identified

## 2017-10-26 ENCOUNTER — Ambulatory Visit (INDEPENDENT_AMBULATORY_CARE_PROVIDER_SITE_OTHER): Payer: No Typology Code available for payment source | Admitting: Pharmacist

## 2017-10-26 DIAGNOSIS — Z5181 Encounter for therapeutic drug level monitoring: Secondary | ICD-10-CM

## 2017-10-26 DIAGNOSIS — Z952 Presence of prosthetic heart valve: Secondary | ICD-10-CM

## 2017-10-26 DIAGNOSIS — I351 Nonrheumatic aortic (valve) insufficiency: Secondary | ICD-10-CM | POA: Diagnosis not present

## 2017-10-26 LAB — POCT INR: INR: 2 (ref 2.0–3.0)

## 2017-10-26 MED ORDER — METFORMIN HCL 500 MG PO TABS: 500.0000 mg | ORAL_TABLET | Freq: Two times a day (BID) | ORAL | 1 refills | Status: DC

## 2017-10-26 NOTE — Patient Instructions (Signed)
Description   Continue same dose of coumadin 1 tablet  (5mg ) daily.  Recheck INR in 6 weeks. Call Coumadin clinic with any new medications [phone], fax [phone].

## 2017-10-27 NOTE — Telephone Encounter (Signed)
Bridgette -- can you contact pt to schedule follow up with Dr Charlett Blake. She was due for f/u in June. Thanks!

## 2017-10-30 ENCOUNTER — Ambulatory Visit (INDEPENDENT_AMBULATORY_CARE_PROVIDER_SITE_OTHER): Payer: PRIVATE HEALTH INSURANCE

## 2017-10-30 ENCOUNTER — Ambulatory Visit: Payer: PRIVATE HEALTH INSURANCE | Admitting: Women's Health

## 2017-10-30 ENCOUNTER — Encounter: Payer: Self-pay | Admitting: Women's Health

## 2017-10-30 ENCOUNTER — Telehealth: Payer: Self-pay | Admitting: *Deleted

## 2017-10-30 VITALS — BP 124/80

## 2017-10-30 DIAGNOSIS — D251 Intramural leiomyoma of uterus: Secondary | ICD-10-CM | POA: Diagnosis not present

## 2017-10-30 DIAGNOSIS — D250 Submucous leiomyoma of uterus: Secondary | ICD-10-CM

## 2017-10-30 DIAGNOSIS — D219 Benign neoplasm of connective and other soft tissue, unspecified: Secondary | ICD-10-CM

## 2017-10-30 DIAGNOSIS — N83201 Unspecified ovarian cyst, right side: Secondary | ICD-10-CM | POA: Diagnosis not present

## 2017-10-30 DIAGNOSIS — Z1211 Encounter for screening for malignant neoplasm of colon: Secondary | ICD-10-CM

## 2017-10-30 NOTE — Telephone Encounter (Signed)
Pt scheduled for 11/17/17 @ 8:15am

## 2017-10-30 NOTE — Progress Notes (Signed)
br

## 2017-10-30 NOTE — Telephone Encounter (Signed)
Referral placed at [facility] they will call to schedule.  

## 2017-10-30 NOTE — Telephone Encounter (Signed)
-----   Message from Huel Cote, NP sent at 10/30/2017 10:11 AM EDT ----- Needs GI consult for colonoscopy, first cousin colon ca at 29, bloating, IBS symptoms, hx of breast ca.  Works in [place], first avail early am best.  Dr Carlean Purl

## 2017-10-30 NOTE — Progress Notes (Signed)
47 year old S WF G0 presents for ultrasound.  History of fibroids.  Last cycle February 2019 and has had increased abdominal bloating, abdominal weight gain, changes in bowel elimination , alternating between constipation and loose stools for the past few months as well as generally not feeling well.  Increased fatigue,  weight gain without change in eating or exercise.  First cousin diagnosed with colon cancer at age 39.  57 at age 50 breast cancer, 2006 LSO for benign cyst.  2001 and 2016 cardiac surgery.  Same partner with negative STD screen.  Exam: Became teary-eyed when discussing how she was feeling, states is in a good relationship, work is good and does not understand why she is so emotional. Ultrasound: T/V and T/A anteverted uterus intramural and subserous fibroids 59 x 45 mm, 14 x 11 mm, 15 x 10 mm, 16 x 15 mm, 12 x 10 mm, 27 x 25 mm.   right ovary thin-walled echo-free cyst 23 x 24 mm thin-walled cyst internal low level echoes 27 x 20 mm.  Left adnexa negative.  Negative cul-de-sac.  Fibroid uterus with small cyst Perimenopausal Abdominal bloating/pressure  Plan: Reviewed possibility of fibroids causing discomfort/GI changes/bloating.  Will get GI consult/colonoscopy.  Will repeat ultrasound in 3 months to check stability of small cyst and if any changes.  Reviewed menopause can cause some of the emotional changes, occasionally weight gain.  Pause reviewed.  Menopause reviewed, no HRT due to history of breast cancer.

## 2017-10-30 NOTE — Patient Instructions (Addendum)
Carbohydrate Counting for Diabetes Mellitus, Adult Carbohydrate counting is a method for keeping track of how many carbohydrates you eat. Eating carbohydrates naturally increases the amount of sugar (glucose) in the blood. Counting how many carbohydrates you eat helps keep your blood glucose within normal limits, which helps you manage your diabetes (diabetes mellitus). It is important to know how many carbohydrates you can safely have in each meal. This is different for every person. A diet and nutrition specialist (registered dietitian) can help you make a meal plan and calculate how many carbohydrates you should have at each meal and snack. Carbohydrates are found in the following foods:  Grains, such as breads and cereals.  Dried beans and soy products.  Starchy vegetables, such as potatoes, peas, and corn.  Fruit and fruit juices.  Milk and yogurt.  Sweets and snack foods, such as cake, cookies, candy, chips, and soft drinks.  How do I count carbohydrates? There are two ways to count carbohydrates in food. You can use either of the methods or a combination of both. Reading "Nutrition Facts" on packaged food The "Nutrition Facts" list is included on the labels of almost all packaged foods and beverages in the [place] It includes:  The serving size.  Information about nutrients in each serving, including the grams (g) of carbohydrate per serving.  To use the "Nutrition Facts":  Decide how many servings you will have.  Multiply the number of servings by the number of carbohydrates per serving.  The resulting number is the total amount of carbohydrates that you will be having.  Learning standard serving sizes of other foods When you eat foods containing carbohydrates that are not packaged or do not include "Nutrition Facts" on the label, you need to measure the servings in order to count the amount of carbohydrates:  Measure the foods that you will eat with a food scale or  measuring cup, if needed.  Decide how many standard-size servings you will eat.  Multiply the number of servings by 15. Most carbohydrate-rich foods have about 15 g of carbohydrates per serving. ? For example, if you eat 8 oz (170 g) of strawberries, you will have eaten 2 servings and 30 g of carbohydrates (2 servings x 15 g = 30 g).  For foods that have more than one food mixed, such as soups and casseroles, you must count the carbohydrates in each food that is included.  The following list contains standard serving sizes of common carbohydrate-rich foods. Each of these servings has about 15 g of carbohydrates:   hamburger bun or  English muffin.   oz (15 mL) syrup.   oz (14 g) jelly.  1 slice of bread.  1 six-inch tortilla.  3 oz (85 g) cooked rice or pasta.  4 oz (113 g) cooked dried beans.  4 oz (113 g) starchy vegetable, such as peas, corn, or potatoes.  4 oz (113 g) hot cereal.  4 oz (113 g) mashed potatoes or  of a large baked potato.  4 oz (113 g) canned or frozen fruit.  4 oz (120 mL) fruit juice.  4-6 crackers.  6 chicken nuggets.  6 oz (170 g) unsweetened dry cereal.  6 oz (170 g) plain fat-free yogurt or yogurt sweetened with artificial sweeteners.  8 oz (240 mL) milk.  8 oz (170 g) fresh fruit or one small piece of fruit.  24 oz (680 g) popped popcorn.  Example of carbohydrate counting Sample meal  3 oz (85 g) chicken breast.    6 oz (170 g) brown rice.  4 oz (113 g) corn.  8 oz (240 mL) milk.  8 oz (170 g) strawberries with sugar-free whipped topping. Carbohydrate calculation 1. Identify the foods that contain carbohydrates: ? Rice. ? Corn. ? Milk. ? Strawberries. 2. Calculate how many servings you have of each food: ? 2 servings rice. ? 1 serving corn. ? 1 serving milk. ? 1 serving strawberries. 3. Multiply each number of servings by 15 g: ? 2 servings rice x 15 g = 30 g. ? 1 serving corn x 15 g = 15 g. ? 1 serving milk x 15  g = 15 g. ? 1 serving strawberries x 15 g = 15 g. 4. Add together all of the amounts to find the total grams of carbohydrates eaten: ? 30 g + 15 g + 15 g + 15 g = 75 g of carbohydrates total. This information is not intended to replace advice given to you by your health care provider. Make sure you discuss any questions you have with your health care provider. Document Released: 02/28/2005 Document Revised: 09/18/2015 Document Reviewed: 08/12/2015 Elsevier Interactive Patient Education  2018 [place].  Uterine Fibroids Uterine fibroids are tissue masses (tumors). They are also called leiomyomas. They can develop inside of a woman's womb (uterus). They can grow very large. Fibroids are not cancerous (benign). Most fibroids do not require medical treatment. Follow these instructions at home:  Keep all follow-up visits as told by your doctor. This is important.  Take medicines only as told by your doctor. ? If you were prescribed a hormone treatment, take the hormone medicines exactly as told. ? Do not take aspirin. It can cause bleeding.  Ask your doctor about taking iron pills and increasing the amount of dark green, leafy vegetables in your diet. These actions can help to boost your blood iron levels.  Pay close attention to your period. Tell your doctor about any changes, such as: ? Increased blood flow. This may require you to use more pads or tampons than usual per month. ? A change in the number of days that your period lasts per month. ? A change in symptoms that come with your period, such as back pain or cramping in your belly area (abdomen). Contact a doctor if:  You have pain in your back or the area between your hip bones (pelvic area) that is not controlled by medicines.  You have pain in your abdomen that is not controlled with medicines.  You have an increase in bleeding between and during periods.  You soak tampons or pads in a half hour or less.  You feel  lightheaded.  You feel extra tired.  You feel weak. Get help right away if:  You pass out (faint).  You have a sudden increase in pelvic pain. This information is not intended to replace advice given to you by your health care provider. Make sure you discuss any questions you have with your health care provider. Document Released: 04/02/2010 Document Revised: 10/30/2015 Document Reviewed: 08/27/2013 Elsevier Interactive Patient Education  [employer].

## 2017-11-01 NOTE — Telephone Encounter (Signed)
Patient is scheduled on 12/27/17

## 2017-11-14 ENCOUNTER — Other Ambulatory Visit: Payer: Self-pay | Admitting: Family Medicine

## 2017-11-14 DIAGNOSIS — E559 Vitamin D deficiency, unspecified: Secondary | ICD-10-CM

## 2017-11-17 ENCOUNTER — Other Ambulatory Visit: Payer: Self-pay | Admitting: Family Medicine

## 2017-11-17 ENCOUNTER — Encounter: Payer: Self-pay | Admitting: Family Medicine

## 2017-11-17 ENCOUNTER — Ambulatory Visit: Payer: No Typology Code available for payment source | Admitting: Family Medicine

## 2017-11-17 VITALS — BP 132/84 | HR 66 | Temp 97.7°F | Resp 18 | Ht 64.0 in | Wt 198.8 lb

## 2017-11-17 DIAGNOSIS — E8881 Metabolic syndrome: Secondary | ICD-10-CM

## 2017-11-17 DIAGNOSIS — K219 Gastro-esophageal reflux disease without esophagitis: Secondary | ICD-10-CM

## 2017-11-17 DIAGNOSIS — E785 Hyperlipidemia, unspecified: Secondary | ICD-10-CM

## 2017-11-17 DIAGNOSIS — Z952 Presence of prosthetic heart valve: Secondary | ICD-10-CM

## 2017-11-17 DIAGNOSIS — R7303 Prediabetes: Secondary | ICD-10-CM

## 2017-11-17 DIAGNOSIS — E559 Vitamin D deficiency, unspecified: Secondary | ICD-10-CM

## 2017-11-17 DIAGNOSIS — Z23 Encounter for immunization: Secondary | ICD-10-CM | POA: Diagnosis not present

## 2017-11-17 DIAGNOSIS — E6609 Other obesity due to excess calories: Secondary | ICD-10-CM

## 2017-11-17 DIAGNOSIS — Z79899 Other long term (current) drug therapy: Secondary | ICD-10-CM

## 2017-11-17 DIAGNOSIS — F419 Anxiety disorder, unspecified: Secondary | ICD-10-CM

## 2017-11-17 DIAGNOSIS — F329 Major depressive disorder, single episode, unspecified: Secondary | ICD-10-CM

## 2017-11-17 DIAGNOSIS — E88819 Insulin resistance, unspecified: Secondary | ICD-10-CM

## 2017-11-17 DIAGNOSIS — F32A Depression, unspecified: Secondary | ICD-10-CM

## 2017-11-17 DIAGNOSIS — F988 Other specified behavioral and emotional disorders with onset usually occurring in childhood and adolescence: Secondary | ICD-10-CM

## 2017-11-17 DIAGNOSIS — M79672 Pain in left foot: Secondary | ICD-10-CM

## 2017-11-17 MED ORDER — ESCITALOPRAM OXALATE 10 MG PO TABS: 10.0000 mg | ORAL_TABLET | Freq: Every day | ORAL | 3 refills | Status: DC

## 2017-11-17 NOTE — Assessment & Plan Note (Signed)
Follows with cardiology doing well ?

## 2017-11-17 NOTE — Patient Instructions (Signed)
DASH Eating Plan DASH stands for "Dietary Approaches to Stop Hypertension." The DASH eating plan is a healthy eating plan that has been shown to reduce high blood pressure (hypertension). It may also reduce your risk for type 2 diabetes, heart disease, and stroke. The DASH eating plan may also help with weight loss. What are tips for following this plan? General guidelines  Avoid eating more than 2,300 mg (milligrams) of salt (sodium) a day. If you have hypertension, you may need to reduce your sodium intake to 1,500 mg a day.  Limit alcohol intake to no more than 1 drink a day for nonpregnant women and 2 drinks a day for men. One drink equals 12 oz of beer, 5 oz of wine, or 1 oz of hard liquor.  Work with your health care provider to maintain a healthy body weight or to lose weight. Ask what an ideal weight is for you.  Get at least 30 minutes of exercise that causes your heart to beat faster (aerobic exercise) most days of the week. Activities may include walking, swimming, or biking.  Work with your health care provider or diet and nutrition specialist (dietitian) to adjust your eating plan to your individual calorie needs. Reading food labels  Check food labels for the amount of sodium per serving. Choose foods with less than 5 percent of the Daily Value of sodium. Generally, foods with less than 300 mg of sodium per serving fit into this eating plan.  To find whole grains, look for the word "whole" as the first word in the ingredient list. Shopping  Buy products labeled as "low-sodium" or "no salt added."  Buy fresh foods. Avoid canned foods and premade or frozen meals. Cooking  Avoid adding salt when cooking. Use salt-free seasonings or herbs instead of table salt or sea salt. Check with your health care provider or pharmacist before using salt substitutes.  Do not fry foods. Cook foods using healthy methods such as baking, boiling, grilling, and broiling instead.  Cook with  heart-healthy oils, such as olive, canola, soybean, or sunflower oil. Meal planning   Eat a balanced diet that includes: ? 5 or more servings of fruits and vegetables each day. At each meal, try to fill half of your plate with fruits and vegetables. ? Up to 6-8 servings of whole grains each day. ? Less than 6 oz of lean meat, poultry, or fish each day. A 3-oz serving of meat is about the same size as a deck of cards. One egg equals 1 oz. ? 2 servings of low-fat dairy each day. ? A serving of nuts, seeds, or beans 5 times each week. ? Heart-healthy fats. Healthy fats called Omega-3 fatty acids are found in foods such as flaxseeds and coldwater fish, like sardines, salmon, and mackerel.  Limit how much you eat of the following: ? Canned or prepackaged foods. ? Food that is high in trans fat, such as fried foods. ? Food that is high in saturated fat, such as fatty meat. ? Sweets, desserts, sugary drinks, and other foods with added sugar. ? Full-fat dairy products.  Do not salt foods before eating.  Try to eat at least 2 vegetarian meals each week.  Eat more home-cooked food and less restaurant, buffet, and fast food.  When eating at a restaurant, ask that your food be prepared with less salt or no salt, if possible. What foods are recommended? The items listed may not be a complete list. Talk with your dietitian about what   dietary choices are best for you. Grains Whole-grain or whole-wheat bread. Whole-grain or whole-wheat pasta. Brown rice. Oatmeal. Quinoa. Bulgur. Whole-grain and low-sodium cereals. Pita bread. Low-fat, low-sodium crackers. Whole-wheat flour tortillas. Vegetables Fresh or frozen vegetables (raw, steamed, roasted, or grilled). Low-sodium or reduced-sodium tomato and vegetable juice. Low-sodium or reduced-sodium tomato sauce and tomato paste. Low-sodium or reduced-sodium canned vegetables. Fruits All fresh, dried, or frozen fruit. Canned fruit in natural juice (without  added sugar). Meat and other protein foods Skinless chicken or turkey. Ground chicken or turkey. Pork with fat trimmed off. Fish and seafood. Egg whites. Dried beans, peas, or lentils. Unsalted nuts, nut butters, and seeds. Unsalted canned beans. Lean cuts of beef with fat trimmed off. Low-sodium, lean deli meat. Dairy Low-fat (1%) or fat-free (skim) milk. Fat-free, low-fat, or reduced-fat cheeses. Nonfat, low-sodium ricotta or cottage cheese. Low-fat or nonfat yogurt. Low-fat, low-sodium cheese. Fats and oils Soft margarine without trans fats. Vegetable oil. Low-fat, reduced-fat, or light mayonnaise and salad dressings (reduced-sodium). Canola, safflower, olive, soybean, and sunflower oils. Avocado. Seasoning and other foods Herbs. Spices. Seasoning mixes without salt. Unsalted popcorn and pretzels. Fat-free sweets. What foods are not recommended? The items listed may not be a complete list. Talk with your dietitian about what dietary choices are best for you. Grains Baked goods made with fat, such as croissants, muffins, or some breads. Dry pasta or rice meal packs. Vegetables Creamed or fried vegetables. Vegetables in a cheese sauce. Regular canned vegetables (not low-sodium or reduced-sodium). Regular canned tomato sauce and paste (not low-sodium or reduced-sodium). Regular tomato and vegetable juice (not low-sodium or reduced-sodium). Pickles. Olives. Fruits Canned fruit in a light or heavy syrup. Fried fruit. Fruit in cream or butter sauce. Meat and other protein foods Fatty cuts of meat. Ribs. Fried meat. Bacon. Sausage. Bologna and other processed lunch meats. Salami. Fatback. Hotdogs. Bratwurst. Salted nuts and seeds. Canned beans with added salt. Canned or smoked fish. Whole eggs or egg yolks. Chicken or turkey with skin. Dairy Whole or 2% milk, cream, and half-and-half. Whole or full-fat cream cheese. Whole-fat or sweetened yogurt. Full-fat cheese. Nondairy creamers. Whipped toppings.  Processed cheese and cheese spreads. Fats and oils Butter. Stick margarine. Lard. Shortening. Ghee. Bacon fat. Tropical oils, such as coconut, palm kernel, or palm oil. Seasoning and other foods Salted popcorn and pretzels. Onion salt, garlic salt, seasoned salt, table salt, and sea salt. Worcestershire sauce. Tartar sauce. Barbecue sauce. Teriyaki sauce. Soy sauce, including reduced-sodium. Steak sauce. Canned and packaged gravies. Fish sauce. Oyster sauce. Cocktail sauce. Horseradish that you find on the shelf. Ketchup. Mustard. Meat flavorings and tenderizers. Bouillon cubes. Hot sauce and Tabasco sauce. Premade or packaged marinades. Premade or packaged taco seasonings. Relishes. Regular salad dressings. Where to find more information:  National Heart, Lung, and Blood Institute: www.nhlbi.nih.gov  American Heart Association: www.heart.org Summary  The DASH eating plan is a healthy eating plan that has been shown to reduce high blood pressure (hypertension). It may also reduce your risk for type 2 diabetes, heart disease, and stroke.  With the DASH eating plan, you should limit salt (sodium) intake to 2,300 mg a day. If you have hypertension, you may need to reduce your sodium intake to 1,500 mg a day.  When on the DASH eating plan, aim to eat more fresh fruits and vegetables, whole grains, lean proteins, low-fat dairy, and heart-healthy fats.  Work with your health care provider or diet and nutrition specialist (dietitian) to adjust your eating plan to your individual   calorie needs. This information is not intended to replace advice given to you by your health care provider. Make sure you discuss any questions you have with your health care provider. Document Released: 02/17/2011 Document Revised: 02/22/2016 Document Reviewed: 02/22/2016 Elsevier Interactive Patient Education  2018 [employer]  

## 2017-11-17 NOTE — Assessment & Plan Note (Signed)
Tolerating Adderal

## 2017-11-17 NOTE — Assessment & Plan Note (Signed)
[id number] acceptable, minimize simple carbs. Increase exercise as tolerated.  

## 2017-11-17 NOTE — Assessment & Plan Note (Signed)
Encouraged heart healthy diet, increase exercise, avoid trans fats, consider a krill oil cap daily 

## 2017-11-17 NOTE — Assessment & Plan Note (Signed)
Avoid offending foods, start probiotics. Do not eat large meals in late evening and consider raising head of bed. Cannot skip a dose of the omeprazole without symptoms returning.

## 2017-11-17 NOTE — Assessment & Plan Note (Signed)
Supplement and monitor 

## 2017-11-17 NOTE — Assessment & Plan Note (Addendum)
She is offered an SSRI but declines. Will try a course Escitalopram 10 mg daily it is printed for her so she can decide.

## 2017-11-18 MED ORDER — AMPHETAMINE-DEXTROAMPHETAMINE 20 MG PO TABS: 20.0000 mg | ORAL_TABLET | Freq: Three times a day (TID) | ORAL | 0 refills | Status: DC
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
## 2017-11-20 LAB — PAIN MGMT, PROFILE 8 W/CONF, U
6 Acetylmorphine: NEGATIVE ng/mL (ref <10)
AMPHETAMINES: POSITIVE ng/mL — AB (ref <500)
Alcohol Metabolites: POSITIVE ng/mL — AB (ref <500)
Amphetamine: 4868 ng/mL — ABNORMAL HIGH (ref <250)
Benzodiazepines: NEGATIVE ng/mL (ref <100)
Buprenorphine, Urine: NEGATIVE ng/mL (ref <5)
CREATININE: 48.1 mg/dL
Cocaine Metabolite: NEGATIVE ng/mL (ref <150)
ETHYL GLUCURONIDE (ETG): 4361 ng/mL — AB (ref <500)
Ethyl Sulfate (ETS): 771 ng/mL — ABNORMAL HIGH (ref <100)
MDMA: NEGATIVE ng/mL (ref <500)
Marijuana Metabolite: NEGATIVE ng/mL (ref <20)
Methamphetamine: NEGATIVE ng/mL (ref <250)
Opiates: NEGATIVE ng/mL (ref <100)
Oxidant: NEGATIVE ug/mL (ref <200)
Oxycodone: NEGATIVE ng/mL (ref <100)
pH: 6.12 (ref 4.5–9.0)

## 2017-12-07 ENCOUNTER — Ambulatory Visit (INDEPENDENT_AMBULATORY_CARE_PROVIDER_SITE_OTHER): Payer: No Typology Code available for payment source

## 2017-12-07 DIAGNOSIS — I351 Nonrheumatic aortic (valve) insufficiency: Secondary | ICD-10-CM | POA: Diagnosis not present

## 2017-12-07 DIAGNOSIS — Z5181 Encounter for therapeutic drug level monitoring: Secondary | ICD-10-CM

## 2017-12-07 DIAGNOSIS — Z952 Presence of prosthetic heart valve: Secondary | ICD-10-CM

## 2017-12-07 LAB — POCT INR: INR: 1.9 — AB (ref 2.0–3.0)

## 2017-12-07 NOTE — Patient Instructions (Signed)
Description   Take an extra 1/2 tablet today, then resume same dosage 1 tablet  (5mg ) daily.  Recheck INR in 4 weeks. Call Coumadin clinic with any new medications [phone], fax [phone].

## 2017-12-11 ENCOUNTER — Other Ambulatory Visit: Payer: Self-pay | Admitting: Family Medicine

## 2017-12-11 DIAGNOSIS — R7303 Prediabetes: Secondary | ICD-10-CM

## 2017-12-14 ENCOUNTER — Other Ambulatory Visit: Payer: Self-pay | Admitting: Family Medicine

## 2017-12-14 DIAGNOSIS — E559 Vitamin D deficiency, unspecified: Secondary | ICD-10-CM

## 2017-12-14 MED ORDER — VITAMIN D (ERGOCALCIFEROL) 1.25 MG (50000 UNIT) PO CAPS: 50000.0000 [IU] | ORAL_CAPSULE | ORAL | 0 refills | Status: DC

## 2017-12-14 NOTE — Telephone Encounter (Signed)
Completed 12 weeks- would you like her to continue?

## 2017-12-25 ENCOUNTER — Other Ambulatory Visit: Payer: Self-pay | Admitting: Family Medicine

## 2017-12-25 DIAGNOSIS — R7303 Prediabetes: Secondary | ICD-10-CM

## 2017-12-27 ENCOUNTER — Ambulatory Visit (INDEPENDENT_AMBULATORY_CARE_PROVIDER_SITE_OTHER): Payer: No Typology Code available for payment source | Admitting: Gastroenterology

## 2017-12-27 ENCOUNTER — Encounter: Payer: Self-pay | Admitting: Gastroenterology

## 2017-12-27 ENCOUNTER — Encounter: Payer: Self-pay | Admitting: Women's Health

## 2017-12-27 ENCOUNTER — Telehealth: Payer: Self-pay

## 2017-12-27 ENCOUNTER — Ambulatory Visit (INDEPENDENT_AMBULATORY_CARE_PROVIDER_SITE_OTHER): Payer: PRIVATE HEALTH INSURANCE | Admitting: Women's Health

## 2017-12-27 VITALS — BP 130/82 | HR 52 | Ht 63.75 in | Wt 199.0 lb

## 2017-12-27 VITALS — BP 130/80 | Ht 63.5 in | Wt 199.0 lb

## 2017-12-27 DIAGNOSIS — R194 Change in bowel habit: Secondary | ICD-10-CM

## 2017-12-27 DIAGNOSIS — Z7901 Long term (current) use of anticoagulants: Secondary | ICD-10-CM

## 2017-12-27 DIAGNOSIS — Z1151 Encounter for screening for human papillomavirus (HPV): Secondary | ICD-10-CM | POA: Diagnosis not present

## 2017-12-27 DIAGNOSIS — K219 Gastro-esophageal reflux disease without esophagitis: Secondary | ICD-10-CM

## 2017-12-27 DIAGNOSIS — R1013 Epigastric pain: Secondary | ICD-10-CM

## 2017-12-27 DIAGNOSIS — Z01419 Encounter for gynecological examination (general) (routine) without abnormal findings: Secondary | ICD-10-CM

## 2017-12-27 DIAGNOSIS — R140 Abdominal distension (gaseous): Secondary | ICD-10-CM | POA: Diagnosis not present

## 2017-12-27 DIAGNOSIS — Z952 Presence of prosthetic heart valve: Secondary | ICD-10-CM

## 2017-12-27 MED ORDER — SUPREP BOWEL PREP KIT 17.5-3.13-1.6 GM/177ML PO SOLN: ORAL | 0 refills | Status: DC

## 2017-12-27 NOTE — Telephone Encounter (Signed)
Pt takes warfarin for mechanical AVR, INR goal 2-3. No history of afib or stroke. Ok to hold warfarin for 5 days prior to procedure, she does NOT require Lovenox bridging per our protocol.

## 2017-12-27 NOTE — Telephone Encounter (Signed)
[facility] Pre-operative Risk Assessment     Request for surgical clearance:     Endoscopy Procedure  What type of surgery is being performed?     Endoscopy-colonoscopy  When is this surgery scheduled?     01-18-18  What type of clearance is required ?   Pharmacy  Are there any medications that need to be held prior to surgery and how long? Coumadin 5 days  Practice name and name of physician performing surgery?      [facility]  What is your office phone and fax number?      Phone- [phone]  Fax- [phone] Tia Alert, CMA  Anesthesia type (None, local, MAC, general) ?       MAC  Thank you.

## 2017-12-27 NOTE — Patient Instructions (Addendum)
Health Maintenance for Postmenopausal Women Menopause is a normal process in which your reproductive ability comes to an end. This process happens gradually over a span of months to years, usually between the ages of 22 and 9. Menopause is complete when you have missed 12 consecutive menstrual periods. It is important to talk with your health care provider about some of the most common conditions that affect postmenopausal women, such as heart disease, cancer, and bone loss (osteoporosis). Adopting a healthy lifestyle and getting preventive care can help to promote your health and wellness. Those actions can also lower your chances of developing some of these common conditions. What should I know about menopause? During menopause, you may experience a number of symptoms, such as:  Moderate-to-severe hot flashes.  Night sweats.  Decrease in sex drive.  Mood swings.  Headaches.  Tiredness.  Irritability.  Memory problems.  Insomnia.  Choosing to treat or not to treat menopausal changes is an individual decision that you make with your health care provider. What should I know about hormone replacement therapy and supplements? Hormone therapy products are effective for treating symptoms that are associated with menopause, such as hot flashes and night sweats. Hormone replacement carries certain risks, especially as you become older. If you are thinking about using estrogen or estrogen with progestin treatments, discuss the benefits and risks with your health care provider. What should I know about heart disease and stroke? Heart disease, heart attack, and stroke become more likely as you age. This may be due, in part, to the hormonal changes that your body experiences during menopause. These can affect how your body processes dietary fats, triglycerides, and cholesterol. Heart attack and stroke are both medical emergencies. There are many things that you can do to help prevent heart disease  and stroke:  Have your blood pressure checked at least every 1-2 years. High blood pressure causes heart disease and increases the risk of stroke.  If you are 53-22 years old, ask your health care provider if you should take aspirin to prevent a heart attack or a stroke.  Do not use any tobacco products, including cigarettes, chewing tobacco, or electronic cigarettes. If you need help quitting, ask your health care provider.  It is important to eat a healthy diet and maintain a healthy weight. ? Be sure to include plenty of vegetables, fruits, low-fat dairy products, and lean protein. ? Avoid eating foods that are high in solid fats, added sugars, or salt (sodium).  Get regular exercise. This is one of the most important things that you can do for your health. ? Try to exercise for at least 150 minutes each week. The type of exercise that you do should increase your heart rate and make you sweat. This is known as moderate-intensity exercise. ? Try to do strengthening exercises at least twice each week. Do these in addition to the moderate-intensity exercise.  Know your numbers.Ask your health care provider to check your cholesterol and your blood glucose. Continue to have your blood tested as directed by your health care provider.  What should I know about cancer screening? There are several types of cancer. Take the following steps to reduce your risk and to catch any cancer development as early as possible. Breast Cancer  Practice breast self-awareness. ? This means understanding how your breasts normally appear and feel. ? It also means doing regular breast self-exams. Let your health care provider know about any changes, no matter how small.  If you are 40  or older, have a clinician do a breast exam (clinical breast exam or CBE) every year. Depending on your age, family history, and medical history, it may be recommended that you also have a yearly breast X-ray (mammogram).  If you  have a family history of breast cancer, talk with your health care provider about genetic screening.  If you are at high risk for breast cancer, talk with your health care provider about having an MRI and a mammogram every year.  Breast cancer (BRCA) gene test is recommended for women who have family members with BRCA-related cancers. Results of the assessment will determine the need for genetic counseling and BRCA1 and for BRCA2 testing. BRCA-related cancers include these types: ? Breast. This occurs in males or females. ? Ovarian. ? Tubal. This may also be called fallopian tube cancer. ? Cancer of the abdominal or pelvic lining (peritoneal cancer). ? Prostate. ? Pancreatic.  Cervical, Uterine, and Ovarian Cancer Your health care provider may recommend that you be screened regularly for cancer of the pelvic organs. These include your ovaries, uterus, and vagina. This screening involves a pelvic exam, which includes checking for microscopic changes to the surface of your cervix (Pap test).  For women ages 21-65, health care providers may recommend a pelvic exam and a Pap test every three years. For women ages 79-65, they may recommend the Pap test and pelvic exam, combined with testing for human papilloma virus (HPV), every five years. Some types of HPV increase your risk of cervical cancer. Testing for HPV may also be done on women of any age who have unclear Pap test results.  Other health care providers may not recommend any screening for nonpregnant women who are considered low risk for pelvic cancer and have no symptoms. Ask your health care provider if a screening pelvic exam is right for you.  If you have had past treatment for cervical cancer or a condition that could lead to cancer, you need Pap tests and screening for cancer for at least 20 years after your treatment. If Pap tests have been discontinued for you, your risk factors (such as having a new sexual partner) need to be  reassessed to determine if you should start having screenings again. Some women have medical problems that increase the chance of getting cervical cancer. In these cases, your health care provider may recommend that you have screening and Pap tests more often.  If you have a family history of uterine cancer or ovarian cancer, talk with your health care provider about genetic screening.  If you have vaginal bleeding after reaching menopause, tell your health care provider.  There are currently no reliable tests available to screen for ovarian cancer.  Lung Cancer Lung cancer screening is recommended for adults 69-62 years old who are at high risk for lung cancer because of a history of smoking. A yearly low-dose CT scan of the lungs is recommended if you:  Currently smoke.  Have a history of at least 30 pack-years of smoking and you currently smoke or have quit within the past 15 years. A pack-year is smoking an average of one pack of cigarettes per day for one year.  Yearly screening should:  Continue until it has been 15 years since you quit.  Stop if you develop a health problem that would prevent you from having lung cancer treatment.  Colorectal Cancer  This type of cancer can be detected and can often be prevented.  Routine colorectal cancer screening usually begins at  age 42 and continues through age 45.  If you have risk factors for colon cancer, your health care provider may recommend that you be screened at an earlier age.  If you have a family history of colorectal cancer, talk with your health care provider about genetic screening.  Your health care provider may also recommend using home test kits to check for hidden blood in your stool.  A small camera at the end of a tube can be used to examine your colon directly (sigmoidoscopy or colonoscopy). This is done to check for the earliest forms of colorectal cancer.  Direct examination of the colon should be repeated every  5-10 years until age 71. However, if early forms of precancerous polyps or small growths are found or if you have a family history or genetic risk for colorectal cancer, you may need to be screened more often.  Skin Cancer  Check your skin from head to toe regularly.  Monitor any moles. Be sure to tell your health care provider: ? About any new moles or changes in moles, especially if there is a change in a mole's shape or color. ? If you have a mole that is larger than the size of a pencil eraser.  If any of your family members has a history of skin cancer, especially at a [place] age, talk with your health care provider about genetic screening.  Always use sunscreen. Apply sunscreen liberally and repeatedly throughout the day.  Whenever you are outside, protect yourself by wearing long sleeves, pants, a wide-brimmed hat, and sunglasses.  What should I know about osteoporosis? Osteoporosis is a condition in which bone destruction happens more quickly than new bone creation. After menopause, you may be at an increased risk for osteoporosis. To help prevent osteoporosis or the bone fractures that can happen because of osteoporosis, the following is recommended:  If you are 46-71 years old, get at least 1,000 mg of calcium and at least 600 mg of vitamin D per day.  If you are older than age 55 but younger than age 65, get at least 1,200 mg of calcium and at least 600 mg of vitamin D per day.  If you are older than age 54, get at least 1,200 mg of calcium and at least 800 mg of vitamin D per day.  Smoking and excessive alcohol intake increase the risk of osteoporosis. Eat foods that are rich in calcium and vitamin D, and do weight-bearing exercises several times each week as directed by your health care provider. What should I know about how menopause affects my mental health? Depression may occur at any age, but it is more common as you become older. Common symptoms of depression  include:  Low or sad mood.  Changes in sleep patterns.  Changes in appetite or eating patterns.  Feeling an overall lack of motivation or enjoyment of activities that you previously enjoyed.  Frequent crying spells.  Talk with your health care provider if you think that you are experiencing depression. What should I know about immunizations? It is important that you get and maintain your immunizations. These include:  Tetanus, diphtheria, and pertussis (Tdap) booster vaccine.  Influenza every year before the flu season begins.  Pneumonia vaccine.  Shingles vaccine.  Your health care provider may also recommend other immunizations. This information is not intended to replace advice given to you by your health care provider. Make sure you discuss any questions you have with your health care provider. Document Released: 04/22/2005  Document Revised: 09/18/2015 Document Reviewed: 12/02/2014 Elsevier Interactive Patient Education  2018 [employer].  Carbohydrate Counting for Diabetes Mellitus, Adult Carbohydrate counting is a method for keeping track of how many carbohydrates you eat. Eating carbohydrates naturally increases the amount of sugar (glucose) in the blood. Counting how many carbohydrates you eat helps keep your blood glucose within normal limits, which helps you manage your diabetes (diabetes mellitus). It is important to know how many carbohydrates you can safely have in each meal. This is different for every person. A diet and nutrition specialist (registered dietitian) can help you make a meal plan and calculate how many carbohydrates you should have at each meal and snack. Carbohydrates are found in the following foods:  Grains, such as breads and cereals.  Dried beans and soy products.  Starchy vegetables, such as potatoes, peas, and corn.  Fruit and fruit juices.  Milk and yogurt.  Sweets and snack foods, such as cake, cookies, candy, chips, and soft  drinks.  How do I count carbohydrates? There are two ways to count carbohydrates in food. You can use either of the methods or a combination of both. Reading "Nutrition Facts" on packaged food The "Nutrition Facts" list is included on the labels of almost all packaged foods and beverages in the [place] It includes:  The serving size.  Information about nutrients in each serving, including the grams (g) of carbohydrate per serving.  To use the "Nutrition Facts":  Decide how many servings you will have.  Multiply the number of servings by the number of carbohydrates per serving.  The resulting number is the total amount of carbohydrates that you will be having.  Learning standard serving sizes of other foods When you eat foods containing carbohydrates that are not packaged or do not include "Nutrition Facts" on the label, you need to measure the servings in order to count the amount of carbohydrates:  Measure the foods that you will eat with a food scale or measuring cup, if needed.  Decide how many standard-size servings you will eat.  Multiply the number of servings by 15. Most carbohydrate-rich foods have about 15 g of carbohydrates per serving. ? For example, if you eat 8 oz (170 g) of strawberries, you will have eaten 2 servings and 30 g of carbohydrates (2 servings x 15 g = 30 g).  For foods that have more than one food mixed, such as soups and casseroles, you must count the carbohydrates in each food that is included.  The following list contains standard serving sizes of common carbohydrate-rich foods. Each of these servings has about 15 g of carbohydrates:   hamburger bun or  English muffin.   oz (15 mL) syrup.   oz (14 g) jelly.  1 slice of bread.  1 six-inch tortilla.  3 oz (85 g) cooked rice or pasta.  4 oz (113 g) cooked dried beans.  4 oz (113 g) starchy vegetable, such as peas, corn, or potatoes.  4 oz (113 g) hot cereal.  4 oz (113 g) mashed potatoes  or  of a large baked potato.  4 oz (113 g) canned or frozen fruit.  4 oz (120 mL) fruit juice.  4-6 crackers.  6 chicken nuggets.  6 oz (170 g) unsweetened dry cereal.  6 oz (170 g) plain fat-free yogurt or yogurt sweetened with artificial sweeteners.  8 oz (240 mL) milk.  8 oz (170 g) fresh fruit or one small piece of fruit.  24 oz (680 g)  popped popcorn.  Example of carbohydrate counting Sample meal  3 oz (85 g) chicken breast.  6 oz (170 g) brown rice.  4 oz (113 g) corn.  8 oz (240 mL) milk.  8 oz (170 g) strawberries with sugar-free whipped topping. Carbohydrate calculation 1. Identify the foods that contain carbohydrates: ? Rice. ? Corn. ? Milk. ? Strawberries. 2. Calculate how many servings you have of each food: ? 2 servings rice. ? 1 serving corn. ? 1 serving milk. ? 1 serving strawberries. 3. Multiply each number of servings by 15 g: ? 2 servings rice x 15 g = 30 g. ? 1 serving corn x 15 g = 15 g. ? 1 serving milk x 15 g = 15 g. ? 1 serving strawberries x 15 g = 15 g. 4. Add together all of the amounts to find the total grams of carbohydrates eaten: ? 30 g + 15 g + 15 g + 15 g = 75 g of carbohydrates total. This information is not intended to replace advice given to you by your health care provider. Make sure you discuss any questions you have with your health care provider. Document Released: 02/28/2005 Document Revised: 09/18/2015 Document Reviewed: 08/12/2015 Elsevier Interactive Patient Education  2018 [employer]. BRCA Gene Testing Why am I having this test? BRCA gene testing is done to check for the presence of harmful changes (mutations) in the BRCA1 gene or the BRCA2 gene (breast cancer susceptibility genes). If there is a mutation, the genes may not be able to help repair damaged cells in the body. As a result, the damaged cells may develop defects that can lead to certain types of cancer. You may have this test if you have a family  history of certain types of cancer, including cancer of the:  Breast.  Ovaries.  Fallopian tubes.  Peritoneum.  Pancreas.  Prostate.  What kind of sample is taken? The test requires either a sample of blood or a sample of cells from your saliva. If a sample of blood is needed, it will probably be collected by inserting a needle into a vein. If a sample of saliva is needed, you will get instructions about how to collect the sample. What do the results mean? The test results can show whether you have a mutation in the BRCA1 or BRCA2 gene that increases your risk for certain cancers. Meaning of negative test results A negative test result means that you do not have a mutation in the BRCA1 or BRCA2 gene that is known to increase your risk for certain cancers. This does not mean that you will never get cancer. Talk with your health care provider or a genetic counselor about what this result means for you. Meaning of positive test results A positive test result means that you have a mutation in the BRCA1 or BRCA2 gene that increases your risk for certain cancers. Women with a positive test result have an increased risk for breast and ovarian cancer. Both women and men with a mutation have an increased risk for breast cancer and may be at greater risk for other types of cancer. Getting a positive test result does not mean that you will develop cancer. Talk with your health care provider or a genetic counselor about what this result means for you. You may be told that you are a carrier. This means that you can pass the mutation to your children. Meaning of ambiguous test results Ambiguous, inconclusive, or uncertain test results mean that there  is a change in the BRCA1 or BRCA2 gene, but it is a change that has not been linked to cancer. Talk with your health care provider or a genetic counselor about what this result means for you. Talk with your health care provider to discuss your results,  treatment options, and if necessary, the need for more tests. Talk with your health care provider if you have any questions about your results. How do I get my results? It is up to you to get your test results. Ask your health care provider, or the department that is doing the test, when your results will be ready. This information is not intended to replace advice given to you by your health care provider. Make sure you discuss any questions you have with your health care provider. Document Released: 03/24/2004 Document Revised: 11/02/2015 Document Reviewed: 10/21/2015 Elsevier Interactive Patient Education  [employer].

## 2017-12-27 NOTE — Patient Instructions (Addendum)
If you are age 48 or older, your body mass index should be between 23-30. Your Body mass index is 34.43 kg/m. If this is out of the aforementioned range listed, please consider follow up with your Primary Care Provider.  If you are age 71 or younger, your body mass index should be between 19-25. Your Body mass index is 34.43 kg/m. If this is out of the aformentioned range listed, please consider follow up with your Primary Care Provider.   You have been scheduled for am endo-colonoscopy. Please follow written instructions given to you at your visit today.  Please pick up your prep supplies at the pharmacy within the next 1-3 days. If you use inhalers (even only as needed), please bring them with you on the day of your procedure. Your physician has requested that you go to www.startemmi.com and enter the access code given to you at your visit today. This web site gives a general overview about your procedure. However, you should still follow specific instructions given to you by our office regarding your preparation for the procedure.  You will be contacted by our office prior to your procedure for directions on holding your Coumadin.  If you do not hear from our office 1 week prior to your scheduled procedure, please call [phone] to discuss.   Increase your Omeprazole 20 mg to twice a day.   Please start taking a daily fiber supplement such as Citrucel.  Thank you for entrusting me with your care and for choosing [facility] [place] [facility]

## 2017-12-27 NOTE — Telephone Encounter (Signed)
Called and spoke to patient.  She understands to hold coumadin starting on 11-2 for 11-7 procedure.
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
## 2017-12-27 NOTE — Telephone Encounter (Signed)
   Primary Cardiologist:Traci [name], MD  Chart reviewed as part of pre-operative protocol coverage.   She has a hx of subaortic stenosis s/p prior repair and subsequent Bentall procedure with repair of ascending aortic aneurysm and mechanical AVR in 2016, hyperlipidemia, diabetes.  She was last seen in 11/2016.    I will route to CVRR for recommendations regarding Coumadin.  Patient has an appointment with Dr. Radford Pax in Oct 2019 (EGD in 01/2018).  I will also route note to Dr. Radford Pax as an FYI for her appointment in 01/2018.  Note will be removed from preop pool.   Richardson Dopp, PA-C  12/27/2017, 2:57 PM

## 2017-12-29 LAB — PAP, TP IMAGING W/ HPV RNA, RFLX HPV TYPE 16,18/45: HPV DNA High Risk: NOT DETECTED

## 2018-01-08 ENCOUNTER — Ambulatory Visit: Payer: No Typology Code available for payment source | Admitting: Cardiology

## 2018-01-08 ENCOUNTER — Ambulatory Visit (INDEPENDENT_AMBULATORY_CARE_PROVIDER_SITE_OTHER): Payer: No Typology Code available for payment source | Admitting: *Deleted

## 2018-01-08 ENCOUNTER — Encounter: Payer: Self-pay | Admitting: Cardiology

## 2018-01-08 VITALS — BP 126/78 | HR 62 | Ht 64.0 in | Wt 202.0 lb

## 2018-01-08 DIAGNOSIS — I351 Nonrheumatic aortic (valve) insufficiency: Secondary | ICD-10-CM | POA: Diagnosis not present

## 2018-01-08 DIAGNOSIS — Q244 Congenital subaortic stenosis: Secondary | ICD-10-CM

## 2018-01-08 DIAGNOSIS — I493 Ventricular premature depolarization: Secondary | ICD-10-CM

## 2018-01-08 DIAGNOSIS — Z952 Presence of prosthetic heart valve: Secondary | ICD-10-CM

## 2018-01-08 DIAGNOSIS — E7849 Other hyperlipidemia: Secondary | ICD-10-CM | POA: Diagnosis not present

## 2018-01-08 DIAGNOSIS — Z5181 Encounter for therapeutic drug level monitoring: Secondary | ICD-10-CM

## 2018-01-08 LAB — POCT INR: INR: 2.7 (ref 2.0–3.0)

## 2018-01-08 NOTE — Patient Instructions (Signed)
Medication Instructions:  Your physician recommends that you continue on your current medications as directed. Please refer to the Current Medication list given to you today.  If you need a refill on your cardiac medications before your next appointment, please call your pharmacy.   Lab work:  If you have labs (blood work) drawn today and your tests are completely normal, you will receive your results only by: . MyChart Message (if you have MyChart) OR . A paper copy in the mail If you have any lab test that is abnormal or we need to change your treatment, we will call you to review the results.  Follow-Up: At [facility], you and your health needs are our priority.  As part of our continuing mission to provide you with exceptional heart care, we have created designated Provider Care Teams.  These Care Teams include your primary Cardiologist (physician) and Advanced Practice Providers (APPs -  Physician Assistants and Nurse Practitioners) who all work together to provide you with the care you need, when you need it. You will need a follow up appointment in 1 years.  Please call our office 2 months in advance to schedule this appointment.  You may see Traci [name], MD or one of the following Advanced Practice Providers on your designated Care Team:   Brittainy Simmons, PA-C Dayna Dunn, PA-C . Michele Lenze, PA-C    
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
## 2018-01-08 NOTE — Patient Instructions (Addendum)
Description   Continue taking 1 tablet  (5mg ) daily. Take your last dose on 01/12/18 for procedure on 01/18/18.  Recheck INR in 1 week after procedure. Call Coumadin clinic with any new medications [phone], fax [phone].     When you resume your Coumadin take an extra 1/2 tablet for 2 days then resume resume normal dose.

## 2018-01-14 ENCOUNTER — Other Ambulatory Visit: Payer: Self-pay | Admitting: Family Medicine

## 2018-01-14 DIAGNOSIS — E559 Vitamin D deficiency, unspecified: Secondary | ICD-10-CM

## 2018-01-16 ENCOUNTER — Other Ambulatory Visit: Payer: Self-pay | Admitting: Family Medicine

## 2018-01-16 DIAGNOSIS — Z0000 Encounter for general adult medical examination without abnormal findings: Secondary | ICD-10-CM

## 2018-01-16 DIAGNOSIS — R739 Hyperglycemia, unspecified: Secondary | ICD-10-CM

## 2018-01-16 DIAGNOSIS — E785 Hyperlipidemia, unspecified: Secondary | ICD-10-CM

## 2018-01-18 ENCOUNTER — Encounter: Payer: Self-pay | Admitting: Gastroenterology

## 2018-01-18 ENCOUNTER — Ambulatory Visit (AMBULATORY_SURGERY_CENTER): Payer: No Typology Code available for payment source | Admitting: Gastroenterology

## 2018-01-18 VITALS — BP 111/55 | HR 56 | Temp 98.0°F | Resp 15 | Ht 63.0 in | Wt 199.0 lb

## 2018-01-18 DIAGNOSIS — K317 Polyp of stomach and duodenum: Secondary | ICD-10-CM

## 2018-01-18 DIAGNOSIS — K2970 Gastritis, unspecified, without bleeding: Secondary | ICD-10-CM

## 2018-01-18 DIAGNOSIS — R194 Change in bowel habit: Secondary | ICD-10-CM

## 2018-01-18 DIAGNOSIS — K449 Diaphragmatic hernia without obstruction or gangrene: Secondary | ICD-10-CM | POA: Diagnosis not present

## 2018-01-18 DIAGNOSIS — R140 Abdominal distension (gaseous): Secondary | ICD-10-CM

## 2018-01-18 DIAGNOSIS — K635 Polyp of colon: Secondary | ICD-10-CM

## 2018-01-18 DIAGNOSIS — D123 Benign neoplasm of transverse colon: Secondary | ICD-10-CM

## 2018-01-18 DIAGNOSIS — K2100 Gastro-esophageal reflux disease with esophagitis, without bleeding: Secondary | ICD-10-CM

## 2018-01-18 DIAGNOSIS — R1013 Epigastric pain: Secondary | ICD-10-CM

## 2018-01-18 DIAGNOSIS — K2950 Unspecified chronic gastritis without bleeding: Secondary | ICD-10-CM

## 2018-01-18 DIAGNOSIS — Z7901 Long term (current) use of anticoagulants: Secondary | ICD-10-CM

## 2018-01-18 MED ORDER — SODIUM CHLORIDE 0.9 % IV SOLN: 500.0000 mL | Freq: Once | INTRAVENOUS | Status: DC

## 2018-01-18 NOTE — Progress Notes (Signed)
Called to room to assist during endoscopic procedure.  Patient ID and intended procedure confirmed with present staff. Received instructions for my participation in the procedure from the performing physician.  

## 2018-01-18 NOTE — Patient Instructions (Signed)
YOU HAD AN ENDOSCOPIC PROCEDURE TODAY AT [facility]:   Refer to the procedure report that was given to you for any specific questions about what was found during the examination.  If the procedure report does not answer your questions, please call your gastroenterologist to clarify.  If you requested that your care partner not be given the details of your procedure findings, then the procedure report has been included in a sealed envelope for you to review at your convenience later.  YOU SHOULD EXPECT: Some feelings of bloating in the abdomen. Passage of more gas than usual.  Walking can help get rid of the air that was put into your GI tract during the procedure and reduce the bloating. If you had a lower endoscopy (such as a colonoscopy or flexible sigmoidoscopy) you may notice spotting of blood in your stool or on the toilet paper. If you underwent a bowel prep for your procedure, you may not have a normal bowel movement for a few days.  Please Note:  You might notice some irritation and congestion in your nose or some drainage.  This is from the oxygen used during your procedure.  There is no need for concern and it should clear up in a day or so.  SYMPTOMS TO REPORT IMMEDIATELY:   Following lower endoscopy (colonoscopy or flexible sigmoidoscopy):  Excessive amounts of blood in the stool  Significant tenderness or worsening of abdominal pains  Swelling of the abdomen that is new, acute  Fever of 100F or higher   Following upper endoscopy (EGD)  Vomiting of blood or coffee ground material  New chest pain or pain under the shoulder blades  Painful or persistently difficult swallowing  New shortness of breath  Fever of 100F or higher  Black, tarry-looking stools  For urgent or emergent issues, a gastroenterologist can be reached at any hour by calling [phone].  handouts given on polyps, GERD protocol, and FODMAP!  DIET:  We do recommend a small meal at first,  but then you may proceed to your regular diet.  Drink plenty of fluids but you should avoid alcoholic beverages for 24 hours.  ACTIVITY:  You should plan to take it easy for the rest of today and you should NOT DRIVE or use heavy machinery until tomorrow (because of the sedation medicines used during the test).    FOLLOW UP: Our staff will call the number listed on your records the next business day following your procedure to check on you and address any questions or concerns that you may have regarding the information given to you following your procedure. If we do not reach you, we will leave a message.  However, if you are feeling well and you are not experiencing any problems, there is no need to return our call.  We will assume that you have returned to your regular daily activities without incident.  If any biopsies were taken you will be contacted by phone or by letter within the next 1-3 weeks.  Please call us at [phone] if you have not heard about the biopsies in 3 weeks.    SIGNATURES/CONFIDENTIALITY: You and/or your care partner have signed paperwork which will be entered into your electronic medical record.  These signatures attest to the fact that that the information above on your After Visit Summary has been reviewed and is understood.  Full responsibility of the confidentiality of this discharge information lies with you and/or your care-partner.

## 2018-01-18 NOTE — Op Note (Addendum)
[facility] Patient Name: [name] Procedure Date: 01/18/2018 2:41 PM MRN: [id number] Endoscopist: Remo Lipps P. Havery Moros , MD Age: 47 Referring MD:  Date of Birth: [birth year] Gender: Female Account #: [id number] Procedure:                Colonoscopy Indications:              Change in bowel habits, suspected irritable bowel                            syndrome, now somewhat improved with fiber                            supplementation, no prior colonoscopy Medicines:                Monitored Anesthesia Care Procedure:                Pre-Anesthesia Assessment:                           - Prior to the procedure, a History and Physical                            was performed, and patient medications and                            allergies were reviewed. The patient's tolerance of                            previous anesthesia was also reviewed. The risks                            and benefits of the procedure and the sedation                            options and risks were discussed with the patient.                            All questions were answered, and informed consent                            was obtained. Prior Anticoagulants: The patient has                            taken Coumadin (warfarin), last dose was 5 days                            prior to procedure. ASA Grade Assessment: III - A                            patient with severe systemic disease. After                            reviewing the risks and benefits, the patient was  deemed in satisfactory condition to undergo the                            procedure.                           After obtaining informed consent, the colonoscope                            was passed under direct vision. Throughout the                            procedure, the patient's blood pressure, pulse, and                            oxygen saturations were monitored continuously. The                  Colonoscope was introduced through the anus and                            advanced to the the terminal ileum, with                            identification of the appendiceal orifice and IC                            valve. The colonoscopy was technically difficult                            and complex due to a tortuous colon. The patient                            tolerated the procedure well. The quality of the                            bowel preparation was good. The terminal ileum,                            ileocecal valve, appendiceal orifice, and rectum                            were photographed. Scope In: 2:55:00 PM Scope Out: 3:15:49 PM Scope Withdrawal Time: 0 hours 15 minutes 59 seconds  Total Procedure Duration: 0 hours 20 minutes 49 seconds  Findings:                 Skin tags were found on perianal exam.                           The terminal ileum appeared normal.                           A 3 mm polyp was found in the hepatic flexure. The  polyp was sessile. The polyp was removed with a                            cold biopsy forceps. Resection and retrieval were                            complete.                           Internal hemorrhoids were found during retroflexion.                           The colon was extremely tortuous with significant                            looping.                           The exam was otherwise without abnormality. Complications:            No immediate complications. Estimated blood loss:                            Minimal. Estimated Blood Loss:     Estimated blood loss was minimal. Impression:               - Perianal skin tags found on perianal exam.                           - The examined portion of the ileum was normal.                           - One 3 mm polyp at the hepatic flexure, removed                            with a cold biopsy forceps. Resected and retrieved.                            - Internal hemorrhoids.                           - Tortuous colon.                           - The examination was otherwise normal. Recommendation:           - Patient has a contact number available for                            emergencies. The signs and symptoms of potential                            delayed complications were discussed with the                            patient. Return to normal activities tomorrow.  Written discharge instructions were provided to the                            patient.                           - Resume previous diet.                           - Continue present medications.                           - Resume Coumadin today                           - Await pathology results. Remo Lipps P. Armbruster, MD 01/18/2018 3:21:50 PM This report has been signed electronically.

## 2018-01-18 NOTE — Progress Notes (Signed)
A/ox3 pleased with MAC, report to RN 

## 2018-01-18 NOTE — Op Note (Addendum)
[facility] Patient Name: [name] Procedure Date: 01/18/2018 2:42 PM MRN: [id number] Endoscopist: [name] , MD Age: 47 Referring MD:  Date of Birth: 06-17-1970 Gender: Female Account #: [id number] Procedure:                Upper GI endoscopy Indications:              Epigastric abdominal pain / dyspepsia, Follow-up of                            gastro-esophageal reflux disease - reflux improved                            on omeprazole 20mg  twice daily, still having some                            dyspepsia Medicines:                Monitored Anesthesia Care Procedure:                Pre-Anesthesia Assessment:                           - Prior to the procedure, a History and Physical                            was performed, and patient medications and                            allergies were reviewed. The patient's tolerance of                            previous anesthesia was also reviewed. The risks                            and benefits of the procedure and the sedation                            options and risks were discussed with the patient.                            All questions were answered, and informed consent                            was obtained. Prior Anticoagulants: The patient has                            taken Coumadin (warfarin), last dose was 5 days                            prior to procedure. ASA Grade Assessment: III - A                            patient with severe systemic disease. After  reviewing the risks and benefits, the patient was                            deemed in satisfactory condition to undergo the                            procedure.                           After obtaining informed consent, the endoscope was                            passed under direct vision. Throughout the                            procedure, the patient's blood pressure, pulse, and   oxygen saturations were monitored continuously. The                            Endoscope was introduced through the mouth, and                            advanced to the second part of duodenum. The upper                            GI endoscopy was accomplished without difficulty.                            The patient tolerated the procedure well. Scope In: Scope Out: Findings:                 Esophagogastric landmarks were identified: the                            Z-line was found at 38 cm, the gastroesophageal                            junction was found at 38 cm and the upper extent of                            the gastric folds was found at 39 cm from the                            incisors.                           A 1 cm hiatal hernia was present.                           The exam of the esophagus was otherwise normal.                           A single 3 to 4 mm sessile polyp was found in the  gastric body. The polyp was removed with a cold                            biopsy forceps. Resection and retrieval were                            complete.                           The exam of the stomach was otherwise normal.                           Biopsies were taken with a cold forceps in the                            gastric body, at the incisura and in the gastric                            antrum for Helicobacter pylori testing.                           The duodenal bulb and second portion of the                            duodenum were normal. Complications:            No immediate complications. Estimated blood loss:                            Minimal. Estimated Blood Loss:     Estimated blood loss was minimal. Impression:               - Esophagogastric landmarks identified.                           - 1 cm hiatal hernia.                           - Normal esophagus otherwise                           - A single gastric polyp. Resected and  retrieved.                           - Normal stomach - biopsies taken to rule out H                            pyloir                           - Normal duodenal bulb and second portion of the                            duodenum. Recommendation:           - Patient has a contact number available for  emergencies. The signs and symptoms of potential                            delayed complications were discussed with the                            patient. Return to normal activities tomorrow.                            Written discharge instructions were provided to the                            patient.                           - Resume previous diet.                           - Continue present medications.                           - Resume Coumadin today                           - Await pathology results. Joan Lipps P. Armbruster, MD 01/18/2018 3:25:54 PM This report has been signed electronically.

## 2018-01-19 ENCOUNTER — Telehealth: Payer: Self-pay

## 2018-01-19 ENCOUNTER — Telehealth: Payer: Self-pay | Admitting: *Deleted

## 2018-01-19 NOTE — Telephone Encounter (Signed)
Message left

## 2018-01-19 NOTE — Telephone Encounter (Signed)
  Follow up Call-  Call back number 01/18/2018  Post procedure Call Back phone  # [phone]  Permission to leave phone message Yes  Some recent data might be hidden     No ID on answering machine

## 2018-01-25 ENCOUNTER — Ambulatory Visit (INDEPENDENT_AMBULATORY_CARE_PROVIDER_SITE_OTHER): Payer: No Typology Code available for payment source | Admitting: *Deleted

## 2018-01-25 DIAGNOSIS — Z952 Presence of prosthetic heart valve: Secondary | ICD-10-CM | POA: Diagnosis not present

## 2018-01-25 DIAGNOSIS — I351 Nonrheumatic aortic (valve) insufficiency: Secondary | ICD-10-CM

## 2018-01-25 DIAGNOSIS — Z5181 Encounter for therapeutic drug level monitoring: Secondary | ICD-10-CM | POA: Diagnosis not present

## 2018-01-25 LAB — POCT INR: INR: 1.7 — AB (ref 2.0–3.0)

## 2018-01-25 NOTE — Patient Instructions (Signed)
Description   Today take 1.5 tablets, then Continue taking 1 tablet  (5mg ) daily. Recheck INR in 2 weeks.  Call Coumadin clinic with any new medications [phone], fax [phone].

## 2018-02-12 ENCOUNTER — Ambulatory Visit (INDEPENDENT_AMBULATORY_CARE_PROVIDER_SITE_OTHER): Payer: No Typology Code available for payment source | Admitting: Pharmacist

## 2018-02-12 DIAGNOSIS — I351 Nonrheumatic aortic (valve) insufficiency: Secondary | ICD-10-CM

## 2018-02-12 DIAGNOSIS — Z5181 Encounter for therapeutic drug level monitoring: Secondary | ICD-10-CM

## 2018-02-12 DIAGNOSIS — Z952 Presence of prosthetic heart valve: Secondary | ICD-10-CM

## 2018-02-12 LAB — POCT INR: INR: 2.7 (ref 2.0–3.0)

## 2018-02-12 NOTE — Patient Instructions (Signed)
Description   Continue taking 1 tablet (5mg) daily. Recheck INR in 4 weeks.  Call Coumadin clinic with any new medications #[phone], fax [phone].     

## 2018-02-25 ENCOUNTER — Other Ambulatory Visit: Payer: Self-pay | Admitting: Family Medicine

## 2018-02-25 DIAGNOSIS — E559 Vitamin D deficiency, unspecified: Secondary | ICD-10-CM

## 2018-02-27 ENCOUNTER — Other Ambulatory Visit: Payer: Self-pay | Admitting: Family Medicine

## 2018-02-27 ENCOUNTER — Encounter: Payer: Self-pay | Admitting: Family Medicine

## 2018-02-27 MED ORDER — AMPHETAMINE-DEXTROAMPHETAMINE 20 MG PO TABS: 20.0000 mg | ORAL_TABLET | Freq: Two times a day (BID) | ORAL | 0 refills | Status: DC

## 2018-02-27 MED ORDER — AMPHETAMINE-DEXTROAMPHETAMINE 20 MG PO TABS: 20.0000 mg | ORAL_TABLET | Freq: Three times a day (TID) | ORAL | 0 refills | Status: DC

## 2018-03-01 ENCOUNTER — Encounter: Payer: Self-pay | Admitting: Family Medicine

## 2018-03-01 ENCOUNTER — Ambulatory Visit: Payer: No Typology Code available for payment source | Admitting: Family Medicine

## 2018-03-01 VITALS — BP 144/84 | HR 62 | Ht 64.0 in | Wt 198.0 lb

## 2018-03-01 DIAGNOSIS — M79672 Pain in left foot: Secondary | ICD-10-CM

## 2018-03-01 NOTE — Patient Instructions (Signed)
You have metatarsalgia causing the pain on the bottom of your foot but also arthritis of your midfoot causing the localized swelling, pain on the top of your foot. The metatarsal pads in the inserts are very important for the metatarsalgia - switch in and out of different shoes. We can make you additional pairs or if you buy them from [facility] we can help you place the pads. These are the different medications you can take for this: Tylenol 500mg  1-2 tabs three times a day for pain. Capsaicin, aspercreme, or biofreeze topically up to four times a day may also help with pain. Some supplements that may help for arthritis: Boswellia extract, curcumin, pycnogenol Aleve 1-2 tabs twice a day with food Cortisone injections are an option if pain is severe. It's important that you continue to stay active. Heat or ice 15 minutes at a time 3-4 times a day as needed to help with pain. Follow up with me in 1 month.
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
## 2018-03-08 ENCOUNTER — Ambulatory Visit (INDEPENDENT_AMBULATORY_CARE_PROVIDER_SITE_OTHER): Payer: No Typology Code available for payment source | Admitting: *Deleted

## 2018-03-08 DIAGNOSIS — I351 Nonrheumatic aortic (valve) insufficiency: Secondary | ICD-10-CM

## 2018-03-08 DIAGNOSIS — Z952 Presence of prosthetic heart valve: Secondary | ICD-10-CM

## 2018-03-08 DIAGNOSIS — Z5181 Encounter for therapeutic drug level monitoring: Secondary | ICD-10-CM | POA: Diagnosis not present

## 2018-03-08 LAB — POCT INR: INR: 2.7 (ref 2.0–3.0)

## 2018-03-08 NOTE — Patient Instructions (Signed)
Description   Continue taking 1 tablet (5mg) daily. Recheck INR in 4 weeks.  Call Coumadin clinic with any new medications #[phone], fax [phone].     

## 2018-03-12 ENCOUNTER — Other Ambulatory Visit: Payer: Self-pay | Admitting: Family Medicine

## 2018-03-12 MED ORDER — METOPROLOL TARTRATE 50 MG PO TABS: 50.0000 mg | ORAL_TABLET | Freq: Two times a day (BID) | ORAL | 0 refills | Status: DC

## 2018-03-16 ENCOUNTER — Other Ambulatory Visit: Payer: Self-pay | Admitting: Family Medicine

## 2018-03-16 DIAGNOSIS — R7303 Prediabetes: Secondary | ICD-10-CM

## 2018-04-03 ENCOUNTER — Encounter: Payer: Self-pay | Admitting: Family Medicine

## 2018-04-05 ENCOUNTER — Other Ambulatory Visit: Payer: Self-pay | Admitting: Family Medicine

## 2018-04-05 MED ORDER — AMPHETAMINE-DEXTROAMPHETAMINE 20 MG PO TABS: 20.0000 mg | ORAL_TABLET | Freq: Three times a day (TID) | ORAL | 0 refills | Status: DC

## 2018-04-05 MED ORDER — AMPHETAMINE-DEXTROAMPHETAMINE 20 MG PO TABS: 20.0000 mg | ORAL_TABLET | Freq: Two times a day (BID) | ORAL | 0 refills | Status: DC

## 2018-04-05 NOTE — Progress Notes (Signed)
Never could get at the pended orders so I reordered and sent to [facility]

## 2018-04-05 NOTE — Progress Notes (Signed)
I do not see these orders under Rx requests. How many months worth of each does she need? Then I can refill

## 2018-04-06 ENCOUNTER — Other Ambulatory Visit: Payer: Self-pay

## 2018-04-06 MED ORDER — AMPHETAMINE-DEXTROAMPHETAMINE 20 MG PO TABS: 20.0000 mg | ORAL_TABLET | Freq: Three times a day (TID) | ORAL | 0 refills | Status: DC

## 2018-04-06 NOTE — Progress Notes (Signed)
So I resent it to you she needed to take tid #90

## 2018-04-06 NOTE — Progress Notes (Signed)
done

## 2018-04-09 ENCOUNTER — Ambulatory Visit (INDEPENDENT_AMBULATORY_CARE_PROVIDER_SITE_OTHER): Payer: No Typology Code available for payment source | Admitting: *Deleted

## 2018-04-09 DIAGNOSIS — I351 Nonrheumatic aortic (valve) insufficiency: Secondary | ICD-10-CM

## 2018-04-09 DIAGNOSIS — Z952 Presence of prosthetic heart valve: Secondary | ICD-10-CM

## 2018-04-09 DIAGNOSIS — Z5181 Encounter for therapeutic drug level monitoring: Secondary | ICD-10-CM

## 2018-04-09 LAB — POCT INR: INR: 3.4 — AB (ref 2.0–3.0)

## 2018-04-09 NOTE — Patient Instructions (Signed)
Description   Do not take your dose of Coumadin tomorrow (already taken today's dose) then continue taking 1 tablet (5mg ) daily. Recheck INR in 3 weeks.  Call Coumadin clinic with any new medications [phone], fax [phone].

## 2018-04-11 ENCOUNTER — Encounter: Payer: Self-pay | Admitting: Family Medicine

## 2018-04-23 ENCOUNTER — Other Ambulatory Visit: Payer: Self-pay | Admitting: Family Medicine

## 2018-04-23 DIAGNOSIS — E559 Vitamin D deficiency, unspecified: Secondary | ICD-10-CM

## 2018-04-23 MED ORDER — VITAMIN D (ERGOCALCIFEROL) 1.25 MG (50000 UNIT) PO CAPS: 50000.0000 [IU] | ORAL_CAPSULE | ORAL | 0 refills | Status: DC

## 2018-05-03 ENCOUNTER — Ambulatory Visit (INDEPENDENT_AMBULATORY_CARE_PROVIDER_SITE_OTHER): Payer: No Typology Code available for payment source | Admitting: *Deleted

## 2018-05-03 DIAGNOSIS — I351 Nonrheumatic aortic (valve) insufficiency: Secondary | ICD-10-CM

## 2018-05-03 DIAGNOSIS — Z5181 Encounter for therapeutic drug level monitoring: Secondary | ICD-10-CM | POA: Diagnosis not present

## 2018-05-03 DIAGNOSIS — Z952 Presence of prosthetic heart valve: Secondary | ICD-10-CM

## 2018-05-03 LAB — POCT INR: INR: 2.6 (ref 2.0–3.0)

## 2018-05-03 NOTE — Patient Instructions (Signed)
Description   Continue taking 1 tablet (5mg ) daily. Recheck INR in 4-5 weeks.  Call Coumadin clinic with any new medications [phone], fax [phone].

## 2018-05-16 ENCOUNTER — Other Ambulatory Visit: Payer: Self-pay | Admitting: Family Medicine

## 2018-05-16 DIAGNOSIS — E559 Vitamin D deficiency, unspecified: Secondary | ICD-10-CM

## 2018-06-04 ENCOUNTER — Telehealth: Payer: Self-pay

## 2018-06-04 ENCOUNTER — Ambulatory Visit: Payer: No Typology Code available for payment source | Admitting: Family Medicine

## 2018-06-04 NOTE — Telephone Encounter (Signed)

## 2018-06-06 ENCOUNTER — Ambulatory Visit (INDEPENDENT_AMBULATORY_CARE_PROVIDER_SITE_OTHER): Payer: No Typology Code available for payment source | Admitting: Pharmacist

## 2018-06-06 ENCOUNTER — Encounter: Payer: Self-pay | Admitting: Family Medicine

## 2018-06-06 ENCOUNTER — Other Ambulatory Visit: Payer: Self-pay

## 2018-06-06 ENCOUNTER — Ambulatory Visit: Payer: No Typology Code available for payment source | Admitting: Family Medicine

## 2018-06-06 VITALS — BP 127/89 | HR 64 | Temp 98.1°F | Ht 63.0 in | Wt 200.0 lb

## 2018-06-06 DIAGNOSIS — Z952 Presence of prosthetic heart valve: Secondary | ICD-10-CM

## 2018-06-06 DIAGNOSIS — Z5181 Encounter for therapeutic drug level monitoring: Secondary | ICD-10-CM | POA: Diagnosis not present

## 2018-06-06 DIAGNOSIS — I351 Nonrheumatic aortic (valve) insufficiency: Secondary | ICD-10-CM

## 2018-06-06 DIAGNOSIS — M79672 Pain in left foot: Secondary | ICD-10-CM

## 2018-06-06 LAB — POCT INR: INR: 2.3 (ref 2.0–3.0)
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
## 2018-06-06 NOTE — Patient Instructions (Signed)
Description   Continue taking 1 tablet (5mg ) daily. Recheck INR in 7 weeks.  Call Coumadin clinic with any new medications [phone], fax [phone].

## 2018-06-06 NOTE — Patient Instructions (Signed)
Your exam and ultrasound are reassuring. This is still consistent with metatarsalgia though you're not responding to the usual treatment. You're not having pain where the arthritis is now. Try the smaller neuroma pad - bring your green inserts back in and I'll place this for you - this gives you the best chance for relief. An MRI is an option but likely will be normal. Can still do the topical medication, lidocaine patches, tylenol. A shot won't help with this and would likely be harmful - you don't have evidence of a neuroma based on your pain location.

## 2018-06-07 ENCOUNTER — Encounter: Payer: Self-pay | Admitting: Family Medicine

## 2018-06-08 ENCOUNTER — Encounter: Payer: No Typology Code available for payment source | Admitting: Family Medicine

## 2018-06-08 ENCOUNTER — Other Ambulatory Visit: Payer: Self-pay

## 2018-06-08 ENCOUNTER — Ambulatory Visit (INDEPENDENT_AMBULATORY_CARE_PROVIDER_SITE_OTHER): Payer: No Typology Code available for payment source | Admitting: Family Medicine

## 2018-06-08 DIAGNOSIS — I10 Essential (primary) hypertension: Secondary | ICD-10-CM | POA: Diagnosis not present

## 2018-06-08 DIAGNOSIS — E7849 Other hyperlipidemia: Secondary | ICD-10-CM

## 2018-06-08 DIAGNOSIS — R7989 Other specified abnormal findings of blood chemistry: Secondary | ICD-10-CM | POA: Diagnosis not present

## 2018-06-08 DIAGNOSIS — E559 Vitamin D deficiency, unspecified: Secondary | ICD-10-CM | POA: Diagnosis not present

## 2018-06-08 DIAGNOSIS — M79673 Pain in unspecified foot: Secondary | ICD-10-CM

## 2018-06-08 DIAGNOSIS — Z9189 Other specified personal risk factors, not elsewhere classified: Secondary | ICD-10-CM

## 2018-06-08 DIAGNOSIS — Z79899 Other long term (current) drug therapy: Secondary | ICD-10-CM

## 2018-06-08 DIAGNOSIS — F988 Other specified behavioral and emotional disorders with onset usually occurring in childhood and adolescence: Secondary | ICD-10-CM

## 2018-06-08 MED ORDER — OMEPRAZOLE 40 MG PO CPDR: 40.0000 mg | DELAYED_RELEASE_CAPSULE | Freq: Every day | ORAL | 5 refills | Status: DC

## 2018-06-08 NOTE — Assessment & Plan Note (Signed)
Doing well on current meds, no changes. Patient agrees to update UDS.

## 2018-06-08 NOTE — Assessment & Plan Note (Signed)
Left foot pain, has been 10/10 frequently. She has just been seen by Sports Medicine and they offered her some shoe inserts but those have not helped. She will consider a orthopaedic consult if pain persists once Covid has passed

## 2018-06-08 NOTE — Assessment & Plan Note (Addendum)
TSH, free T4, thyroid peroxidase antibody. She is very worried her thyroid is off some due to fatigue, hair loss, weight gain.

## 2018-06-08 NOTE — Assessment & Plan Note (Signed)
Encouraged heart healthy diet, increase exercise, avoid trans fats, consider a krill oil cap daily 

## 2018-06-08 NOTE — Assessment & Plan Note (Signed)
Check level and supplement 
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
## 2018-06-08 NOTE — Assessment & Plan Note (Signed)
Supplement and monitor 

## 2018-06-11 ENCOUNTER — Other Ambulatory Visit: Payer: Self-pay

## 2018-06-11 ENCOUNTER — Other Ambulatory Visit: Payer: Self-pay | Admitting: Family Medicine

## 2018-06-11 ENCOUNTER — Other Ambulatory Visit (INDEPENDENT_AMBULATORY_CARE_PROVIDER_SITE_OTHER): Payer: No Typology Code available for payment source

## 2018-06-11 DIAGNOSIS — Z79899 Other long term (current) drug therapy: Secondary | ICD-10-CM

## 2018-06-11 DIAGNOSIS — I10 Essential (primary) hypertension: Secondary | ICD-10-CM

## 2018-06-11 DIAGNOSIS — E559 Vitamin D deficiency, unspecified: Secondary | ICD-10-CM

## 2018-06-11 DIAGNOSIS — Z9189 Other specified personal risk factors, not elsewhere classified: Secondary | ICD-10-CM

## 2018-06-11 DIAGNOSIS — R7989 Other specified abnormal findings of blood chemistry: Secondary | ICD-10-CM

## 2018-06-11 LAB — CBC
HCT: 39.5 % (ref 36.0–46.0)
Hemoglobin: 13.6 g/dL (ref 12.0–15.0)
MCHC: 34.4 g/dL (ref 30.0–36.0)
MCV: 94.2 fl (ref 78.0–100.0)
Platelets: 262 10*3/uL (ref 150.0–400.0)
RBC: 4.19 Mil/uL (ref 3.87–5.11)
RDW: 12.8 % (ref 11.5–15.5)
WBC: 6.4 10*3/uL (ref 4.0–10.5)

## 2018-06-11 LAB — COMPREHENSIVE METABOLIC PANEL
ALT: 23 U/L (ref 0–35)
AST: 21 U/L (ref 0–37)
Albumin: 4.6 g/dL (ref 3.5–5.2)
Alkaline Phosphatase: 44 U/L (ref 39–117)
BUN: 19 mg/dL (ref 6–23)
CO2: 29 mEq/L (ref 19–32)
Calcium: 9.9 mg/dL (ref 8.4–10.5)
Chloride: 100 mEq/L (ref 96–112)
Creatinine, Ser: 0.82 mg/dL (ref 0.40–1.20)
GFR: 74.17 mL/min (ref >60.00)
Glucose, Bld: 113 mg/dL — ABNORMAL HIGH (ref 70–99)
Potassium: 4.2 mEq/L (ref 3.5–5.1)
Sodium: 137 mEq/L (ref 135–145)
Total Bilirubin: 0.7 mg/dL (ref 0.2–1.2)
Total Protein: 7.2 g/dL (ref 6.0–8.3)

## 2018-06-11 LAB — VITAMIN D 25 HYDROXY (VIT D DEFICIENCY, FRACTURES): VITD: 51.81 ng/mL (ref 30.00–100.00)

## 2018-06-11 LAB — T4, FREE: Free T4: 0.8 ng/dL (ref 0.60–1.60)

## 2018-06-11 LAB — VITAMIN B12: Vitamin B-12: 859 pg/mL (ref 211–911)

## 2018-06-11 LAB — HEMOGLOBIN A1C: Hgb A1c MFr Bld: 5.7 % (ref 4.6–6.5)

## 2018-06-11 LAB — TSH: TSH: 4.21 u[IU]/mL (ref 0.35–4.50)

## 2018-06-12 LAB — THYROID PEROXIDASE ANTIBODY: Thyroperoxidase Ab SerPl-aCnc: 1 IU/mL (ref <9)

## 2018-06-14 LAB — PAIN MGMT, PROFILE 8 W/CONF, U
6 Acetylmorphine: NEGATIVE ng/mL (ref <10)
Alcohol Metabolites: POSITIVE ng/mL — AB (ref <500)
Amphetamine: 11640 ng/mL — ABNORMAL HIGH (ref <250)
Amphetamines: POSITIVE ng/mL — AB (ref <500)
Benzodiazepines: NEGATIVE ng/mL (ref <100)
Buprenorphine, Urine: NEGATIVE ng/mL (ref <5)
Cocaine Metabolite: NEGATIVE ng/mL (ref <150)
Creatinine: 93.5 mg/dL
Ethyl Glucuronide (ETG): 2073 ng/mL — ABNORMAL HIGH (ref <500)
Ethyl Sulfate (ETS): 615 ng/mL — ABNORMAL HIGH (ref <100)
MDMA: NEGATIVE ng/mL (ref <500)
Marijuana Metabolite: NEGATIVE ng/mL (ref <20)
Methamphetamine: NEGATIVE ng/mL (ref <250)
OXYCODONE: NEGATIVE ng/mL (ref <100)
Opiates: NEGATIVE ng/mL (ref <100)
Oxidant: NEGATIVE ug/mL (ref <200)
pH: 6.51 (ref 4.5–9.0)

## 2018-06-21 ENCOUNTER — Encounter: Payer: Self-pay | Admitting: Family Medicine

## 2018-06-21 ENCOUNTER — Other Ambulatory Visit: Payer: Self-pay | Admitting: Family Medicine

## 2018-06-21 MED ORDER — AMPHETAMINE-DEXTROAMPHETAMINE 20 MG PO TABS: 20.0000 mg | ORAL_TABLET | Freq: Three times a day (TID) | ORAL | 0 refills | Status: DC

## 2018-06-21 NOTE — Telephone Encounter (Signed)
Last adderall RX: 04/06/18 x 3 RX for January, Feb and March Last OV: 06/08/18 Next OV: 09/2018 UDS: 06/11/18, low risk; repeat in 6 months CSC: 05/29/17

## 2018-07-01 ENCOUNTER — Other Ambulatory Visit: Payer: Self-pay | Admitting: Family Medicine

## 2018-07-01 DIAGNOSIS — E559 Vitamin D deficiency, unspecified: Secondary | ICD-10-CM

## 2018-07-04 ENCOUNTER — Other Ambulatory Visit: Payer: Self-pay | Admitting: Family Medicine

## 2018-07-04 DIAGNOSIS — R7303 Prediabetes: Secondary | ICD-10-CM

## 2018-07-19 ENCOUNTER — Other Ambulatory Visit: Payer: Self-pay | Admitting: Family Medicine

## 2018-07-19 DIAGNOSIS — R7303 Prediabetes: Secondary | ICD-10-CM

## 2018-07-24 ENCOUNTER — Telehealth: Payer: Self-pay

## 2018-07-24 NOTE — Telephone Encounter (Signed)
LMOM FOR PRESCREEN  

## 2018-07-24 NOTE — Telephone Encounter (Signed)

## 2018-07-25 ENCOUNTER — Ambulatory Visit (INDEPENDENT_AMBULATORY_CARE_PROVIDER_SITE_OTHER): Payer: No Typology Code available for payment source

## 2018-07-25 ENCOUNTER — Other Ambulatory Visit: Payer: Self-pay

## 2018-07-25 DIAGNOSIS — I351 Nonrheumatic aortic (valve) insufficiency: Secondary | ICD-10-CM | POA: Diagnosis not present

## 2018-07-25 DIAGNOSIS — Z952 Presence of prosthetic heart valve: Secondary | ICD-10-CM

## 2018-07-25 DIAGNOSIS — Z5181 Encounter for therapeutic drug level monitoring: Secondary | ICD-10-CM | POA: Diagnosis not present

## 2018-07-25 LAB — POCT INR: INR: 3.5 — AB (ref 2.0–3.0)

## 2018-07-25 NOTE — Patient Instructions (Signed)
Description   Called spoke with pt, advised to skip tomorrow's dosage of Coumadin, then resume same dosage 1 tablet (5mg ) daily. Recheck INR in 4 weeks.  Call Coumadin clinic with any new medications [phone], fax [phone].

## 2018-08-07 ENCOUNTER — Other Ambulatory Visit: Payer: Self-pay | Admitting: Family Medicine

## 2018-08-15 ENCOUNTER — Telehealth: Payer: Self-pay

## 2018-08-15 NOTE — Telephone Encounter (Signed)
lmom for prescreen  

## 2018-08-15 NOTE — Telephone Encounter (Signed)
1. COVID-19 Pre-Screening Questions:  . In the past 7 to 10 days have you had a cough,  shortness of breath, headache, congestion, fever (100 or greater) body aches, chills, sore throat, or sudden loss of taste or sense of smell?  No . Have you been around anyone with known Covid 19.  no . Have you been around anyone who is awaiting Covid 19 test results in the past 7 to 10 days?  no . Have you been around anyone who has been exposed to Covid 19, or has mentioned symptoms of Covid 19 within the past 7 to 10 days?  no    2. Pt advised of visitor restrictions (no visitors allowed except if needed to conduct the visit). Also advised to arrive at appointment time and wear a mask.

## 2018-08-20 ENCOUNTER — Other Ambulatory Visit: Payer: Self-pay | Admitting: Family Medicine

## 2018-08-20 ENCOUNTER — Encounter: Payer: Self-pay | Admitting: Family Medicine

## 2018-08-20 DIAGNOSIS — Z0000 Encounter for general adult medical examination without abnormal findings: Secondary | ICD-10-CM

## 2018-08-20 DIAGNOSIS — E785 Hyperlipidemia, unspecified: Secondary | ICD-10-CM

## 2018-08-20 DIAGNOSIS — R739 Hyperglycemia, unspecified: Secondary | ICD-10-CM

## 2018-08-20 DIAGNOSIS — E559 Vitamin D deficiency, unspecified: Secondary | ICD-10-CM

## 2018-08-21 MED ORDER — VITAMIN D (ERGOCALCIFEROL) 1.25 MG (50000 UNIT) PO CAPS: 50000.0000 [IU] | ORAL_CAPSULE | ORAL | 4 refills | Status: DC

## 2018-08-22 ENCOUNTER — Ambulatory Visit (INDEPENDENT_AMBULATORY_CARE_PROVIDER_SITE_OTHER): Payer: No Typology Code available for payment source | Admitting: *Deleted

## 2018-08-22 ENCOUNTER — Other Ambulatory Visit: Payer: Self-pay

## 2018-08-22 DIAGNOSIS — Z5181 Encounter for therapeutic drug level monitoring: Secondary | ICD-10-CM | POA: Diagnosis not present

## 2018-08-22 DIAGNOSIS — I351 Nonrheumatic aortic (valve) insufficiency: Secondary | ICD-10-CM | POA: Diagnosis not present

## 2018-08-22 DIAGNOSIS — Z952 Presence of prosthetic heart valve: Secondary | ICD-10-CM

## 2018-08-22 LAB — POCT INR: INR: 2.5 (ref 2.0–3.0)

## 2018-08-22 NOTE — Patient Instructions (Signed)
Description   Continue taking 1 tablet (5mg ) daily. Recheck INR in 4 weeks.  Call Coumadin clinic with any new medications [phone], fax [phone].

## 2018-09-12 ENCOUNTER — Telehealth: Payer: Self-pay

## 2018-09-12 NOTE — Telephone Encounter (Signed)
lmom for prescreen  

## 2018-09-17 NOTE — Telephone Encounter (Signed)

## 2018-09-19 ENCOUNTER — Ambulatory Visit (INDEPENDENT_AMBULATORY_CARE_PROVIDER_SITE_OTHER): Payer: No Typology Code available for payment source | Admitting: *Deleted

## 2018-09-19 ENCOUNTER — Other Ambulatory Visit: Payer: Self-pay

## 2018-09-19 DIAGNOSIS — Z5181 Encounter for therapeutic drug level monitoring: Secondary | ICD-10-CM | POA: Diagnosis not present

## 2018-09-19 DIAGNOSIS — Z952 Presence of prosthetic heart valve: Secondary | ICD-10-CM

## 2018-09-19 LAB — POCT INR: INR: 3 (ref 2.0–3.0)

## 2018-09-19 NOTE — Patient Instructions (Signed)
Description   Today take 1/2 tablet then continue taking 1 tablet (5mg ) daily. Recheck INR in 4 weeks.  Call Coumadin clinic with any new medications [phone], fax [phone].

## 2018-09-25 ENCOUNTER — Encounter: Payer: Self-pay | Admitting: Family Medicine

## 2018-09-25 ENCOUNTER — Other Ambulatory Visit: Payer: Self-pay | Admitting: Family Medicine

## 2018-09-25 DIAGNOSIS — R7303 Prediabetes: Secondary | ICD-10-CM

## 2018-09-25 MED ORDER — AMPHETAMINE-DEXTROAMPHETAMINE 20 MG PO TABS: 20.0000 mg | ORAL_TABLET | Freq: Three times a day (TID) | ORAL | 0 refills | Status: DC

## 2018-09-25 MED ORDER — METFORMIN HCL 500 MG PO TABS: ORAL_TABLET | ORAL | 0 refills | Status: DC

## 2018-10-01 ENCOUNTER — Encounter: Payer: No Typology Code available for payment source | Admitting: Family Medicine

## 2018-10-17 ENCOUNTER — Ambulatory Visit (INDEPENDENT_AMBULATORY_CARE_PROVIDER_SITE_OTHER): Payer: No Typology Code available for payment source | Admitting: Pharmacist

## 2018-10-17 ENCOUNTER — Other Ambulatory Visit: Payer: Self-pay | Admitting: Family Medicine

## 2018-10-17 ENCOUNTER — Other Ambulatory Visit: Payer: Self-pay

## 2018-10-17 DIAGNOSIS — Z952 Presence of prosthetic heart valve: Secondary | ICD-10-CM | POA: Diagnosis not present

## 2018-10-17 DIAGNOSIS — Z1231 Encounter for screening mammogram for malignant neoplasm of breast: Secondary | ICD-10-CM

## 2018-10-17 DIAGNOSIS — Z5181 Encounter for therapeutic drug level monitoring: Secondary | ICD-10-CM

## 2018-10-17 LAB — POCT INR: INR: 2.8 (ref 2.0–3.0)

## 2018-10-17 NOTE — Patient Instructions (Signed)
Description   Continue taking 1 tablet (5mg ) daily. Recheck INR in 6 weeks.  Call Coumadin clinic with any new medications [phone], fax [phone].

## 2018-11-08 ENCOUNTER — Other Ambulatory Visit: Payer: Self-pay | Admitting: Family Medicine

## 2018-11-08 DIAGNOSIS — E559 Vitamin D deficiency, unspecified: Secondary | ICD-10-CM

## 2018-11-27 ENCOUNTER — Ambulatory Visit (INDEPENDENT_AMBULATORY_CARE_PROVIDER_SITE_OTHER): Payer: No Typology Code available for payment source | Admitting: *Deleted

## 2018-11-27 ENCOUNTER — Other Ambulatory Visit: Payer: Self-pay

## 2018-11-27 DIAGNOSIS — Z952 Presence of prosthetic heart valve: Secondary | ICD-10-CM | POA: Diagnosis not present

## 2018-11-27 DIAGNOSIS — Z5181 Encounter for therapeutic drug level monitoring: Secondary | ICD-10-CM | POA: Diagnosis not present

## 2018-11-27 LAB — POCT INR: INR: 3 (ref 2.0–3.0)

## 2018-11-27 NOTE — Patient Instructions (Addendum)
Description   Continue taking 1 tablet (5mg ) daily. Recheck INR in 7 weeks.   Call Coumadin clinic with any new medications [phone], fax [phone].

## 2018-12-04 ENCOUNTER — Other Ambulatory Visit: Payer: Self-pay | Admitting: Family Medicine

## 2018-12-25 ENCOUNTER — Other Ambulatory Visit: Payer: Self-pay

## 2018-12-25 ENCOUNTER — Ambulatory Visit (INDEPENDENT_AMBULATORY_CARE_PROVIDER_SITE_OTHER): Payer: No Typology Code available for payment source | Admitting: Family Medicine

## 2018-12-25 ENCOUNTER — Encounter: Payer: Self-pay | Admitting: Family Medicine

## 2018-12-25 VITALS — BP 140/70 | HR 88 | Temp 97.3°F | Resp 18 | Ht 64.0 in | Wt 208.8 lb

## 2018-12-25 DIAGNOSIS — E8881 Metabolic syndrome: Secondary | ICD-10-CM | POA: Diagnosis not present

## 2018-12-25 DIAGNOSIS — N951 Menopausal and female climacteric states: Secondary | ICD-10-CM

## 2018-12-25 DIAGNOSIS — R7303 Prediabetes: Secondary | ICD-10-CM

## 2018-12-25 DIAGNOSIS — R002 Palpitations: Secondary | ICD-10-CM

## 2018-12-25 DIAGNOSIS — E7849 Other hyperlipidemia: Secondary | ICD-10-CM | POA: Diagnosis not present

## 2018-12-25 DIAGNOSIS — F419 Anxiety disorder, unspecified: Secondary | ICD-10-CM

## 2018-12-25 DIAGNOSIS — Z23 Encounter for immunization: Secondary | ICD-10-CM

## 2018-12-25 DIAGNOSIS — Z0000 Encounter for general adult medical examination without abnormal findings: Secondary | ICD-10-CM | POA: Diagnosis not present

## 2018-12-25 DIAGNOSIS — F32A Depression, unspecified: Secondary | ICD-10-CM

## 2018-12-25 DIAGNOSIS — E559 Vitamin D deficiency, unspecified: Secondary | ICD-10-CM

## 2018-12-25 DIAGNOSIS — K589 Irritable bowel syndrome without diarrhea: Secondary | ICD-10-CM

## 2018-12-25 DIAGNOSIS — R7989 Other specified abnormal findings of blood chemistry: Secondary | ICD-10-CM | POA: Diagnosis not present

## 2018-12-25 DIAGNOSIS — F329 Major depressive disorder, single episode, unspecified: Secondary | ICD-10-CM | POA: Diagnosis not present

## 2018-12-25 DIAGNOSIS — Q244 Congenital subaortic stenosis: Secondary | ICD-10-CM

## 2018-12-25 DIAGNOSIS — Z952 Presence of prosthetic heart valve: Secondary | ICD-10-CM

## 2018-12-25 DIAGNOSIS — E785 Hyperlipidemia, unspecified: Secondary | ICD-10-CM

## 2018-12-25 DIAGNOSIS — F988 Other specified behavioral and emotional disorders with onset usually occurring in childhood and adolescence: Secondary | ICD-10-CM

## 2018-12-25 LAB — LIPID PANEL
Cholesterol: 209 mg/dL — ABNORMAL HIGH (ref 0–200)
HDL: 38.6 mg/dL — ABNORMAL LOW (ref >39.00)
NonHDL: 170.42
Total CHOL/HDL Ratio: 5
Triglycerides: 362 mg/dL — ABNORMAL HIGH (ref 0.0–149.0)
VLDL: 72.4 mg/dL — ABNORMAL HIGH (ref 0.0–40.0)

## 2018-12-25 LAB — COMPREHENSIVE METABOLIC PANEL
ALT: 37 U/L — ABNORMAL HIGH (ref 0–35)
AST: 32 U/L (ref 0–37)
Albumin: 4.5 g/dL (ref 3.5–5.2)
Alkaline Phosphatase: 46 U/L (ref 39–117)
BUN: 12 mg/dL (ref 6–23)
CO2: 29 mEq/L (ref 19–32)
Calcium: 9.8 mg/dL (ref 8.4–10.5)
Chloride: 101 mEq/L (ref 96–112)
Creatinine, Ser: 0.77 mg/dL (ref 0.40–1.20)
GFR: 79.58 mL/min (ref >60.00)
Glucose, Bld: 107 mg/dL — ABNORMAL HIGH (ref 70–99)
Potassium: 4.3 mEq/L (ref 3.5–5.1)
Sodium: 138 mEq/L (ref 135–145)
Total Bilirubin: 0.7 mg/dL (ref 0.2–1.2)
Total Protein: 7.2 g/dL (ref 6.0–8.3)

## 2018-12-25 LAB — LDL CHOLESTEROL, DIRECT: Direct LDL: 126 mg/dL

## 2018-12-25 LAB — VITAMIN D 25 HYDROXY (VIT D DEFICIENCY, FRACTURES): VITD: 40.66 ng/mL (ref 30.00–100.00)

## 2018-12-25 LAB — VITAMIN B12: Vitamin B-12: 485 pg/mL (ref 211–911)

## 2018-12-25 LAB — TSH: TSH: 3.67 u[IU]/mL (ref 0.35–4.50)

## 2018-12-25 LAB — CBC
HCT: 38.9 % (ref 36.0–46.0)
Hemoglobin: 13.2 g/dL (ref 12.0–15.0)
MCHC: 33.9 g/dL (ref 30.0–36.0)
MCV: 95.6 fl (ref 78.0–100.0)
Platelets: 231 10*3/uL (ref 150.0–400.0)
RBC: 4.06 Mil/uL (ref 3.87–5.11)
RDW: 12.8 % (ref 11.5–15.5)
WBC: 6.3 10*3/uL (ref 4.0–10.5)

## 2018-12-25 LAB — HEMOGLOBIN A1C: Hgb A1c MFr Bld: 5.8 % (ref 4.6–6.5)

## 2018-12-25 MED ORDER — AMPHETAMINE-DEXTROAMPHETAMINE 20 MG PO TABS: 20.0000 mg | ORAL_TABLET | Freq: Three times a day (TID) | ORAL | 0 refills | Status: DC

## 2018-12-25 NOTE — Progress Notes (Signed)
Subjective:    Patient ID: [name], female    DOB: [birth year], 48 y.o.   MRN: [id number]  HPI  Patient is here for annual physical.   Patient would like to discuss her metformin. States she started it several years ago through weight management clinic. She stopped going to the clinic because it was too far from her house. She reports it may have helped a little with weight loss, but she thinks she may be having some side effects now. Reporting sluggishness/fatigue, daily morning nausea. She stopped taking her metformin about 10 days ago, she thinks she may have a little improvement in fatigue, but continues to have morning nausea.  Reports her friend started Contrave and she is interested in discussing this as an option for weight loss. She has been taking Wellbutrin for many years for depression/mood. She feels like it seems to be helping her mood, but she would like to lose some weight. She has started taking OTC Ashwaganda which has helped with her stress/anxiety levels some. She is not interested in starting any other meds for mood because she does not want to gain anymore weight.   Hx of subaortic stenosis with aortic valve replacement in 2016. No complaints or concerns. No shortness of breath, chest pain, or palpitations. Routine cardiology appointment next month.  LMP: approximately February 2020. Appointment with GYN next week. Mammogram next week, Hx of breast cancer. Pap smear normal last October.  Reports she has gained about 25 pounds since March 2020.  She reports she has been trying to eat healthy but is not exercising. She drinks alcohol, about 7 servings per week. Nonsmoker.  Flu shot today.   Review of Systems  Constitutional: Positive for fatigue. Negative for chills and fever.  HENT: Negative for congestion, sinus pressure and sore throat.   Eyes: Negative for visual disturbance.  Respiratory: Negative for cough, chest tightness and shortness of breath.    Cardiovascular: Negative for chest pain and palpitations.  Gastrointestinal: Negative for nausea and vomiting.       Chronic IBS with both constipation and diarrhea; nausea almost every morning states it feels kind of bloating and nausea associated with PMS. LMP 04/2018.  Endocrine: Negative for polydipsia, polyphagia and polyuria.  Genitourinary: Positive for frequency. Negative for dysuria, flank pain, hematuria and urgency.       Stress incontinence at times recently  Musculoskeletal: Negative for arthralgias, back pain and myalgias.       Chronic left foot pain  Skin: Negative for rash.  Neurological: Negative for dizziness, weakness, light-headedness and headaches.  Psychiatric/Behavioral: Negative for decreased concentration and sleep disturbance. The patient is not nervous/anxious.        Reports some stress/moodiness, managed fairly well on her current regimen       Objective:   Physical Exam Vitals signs reviewed.  Constitutional:      Appearance: Normal appearance.  HENT:     Head: Normocephalic and atraumatic.     Right Ear: Tympanic membrane normal.     Left Ear: Tympanic membrane normal.  Eyes:     Extraocular Movements: Extraocular movements intact.     Conjunctiva/sclera: Conjunctivae normal.     Pupils: Pupils are equal, round, and reactive to light.  Neck:     Musculoskeletal: Normal range of motion.  Cardiovascular:     Rate and Rhythm: Normal rate and regular rhythm.     Pulses: Normal pulses.     Heart sounds: Murmur present. Systolic murmur present with a  grade of 1/6.     Comments: Mechanical valve click audible (AVR in 2016) Pulmonary:     Effort: Pulmonary effort is normal.     Breath sounds: Normal breath sounds.  Abdominal:     General: Bowel sounds are normal.     Palpations: Abdomen is soft.  Musculoskeletal: Normal range of motion.  Skin:    General: Skin is warm and dry.     Capillary Refill: Capillary refill takes less than 2 seconds.   Neurological:     General: No focal deficit present.     Mental Status: She is alert and oriented to person, place, and time. Mental status is at baseline.  Psychiatric:        Mood and Affect: Mood normal.        Behavior: Behavior normal.        Thought Content: Thought content normal.        Judgment: Judgment normal.           Assessment & Plan:   1. Subaortic stenosis with AVR 2016 - no complaints or acute findings today. Keep routine cardiology follow-up appointment for next month. Continue taking coumadin and following with routine lab work. BP borderline elevation today. Encouraged patient to check BP at home/work occasionally and report back if BP >150/90. Continue metoprolol as prescribed for palpitations. None reported today.   2. Insulin resistance - Patient had weaned herself to 500 mg metformin once per day and completely stopped 10 days ago due to possible side effects including nausea/bloating.  Discussed benefits of metformin and also possibility of reducing to 250 mg daily. Will recheck A1c today and update patient.   3. Anxiety/depression/ADD - Patient reporting improved mood on current Wellbutrin and OTC Ashwaganda. She has a prescription for Lexapro which she said she never started. Educated on medication and encouraged her that it will take several weeks before she notices effects if she decides to start it. Continue Adderall as prescribed. Patient states current dose is working well.  4. Obesity - Weight management discussed including diet, exercise, and various apps including Noom and Weight Watchers for assistance and accountability. Contrave not recommended at this time due to history of depression. Encourage lifestyle modifications at this time and current regimen for anxiety/depression as this may help improve her mood and allow her to find motivation to work towards losing weight. Patient is not interested in the weight management clinic at this time as it is too  far from her house and she will be starting a new job next month.  5. Hyperlipidemia - Discussed diet and exercise. Continue atorvastatin. Will check lipid panel today and update patient on results and plan of care.   6. Elevated TSH in past - current fatigue, possibly related to menopause as well. Will recheck labs today and update patient on results.   7. Vitamin D deficiency - fatigue, will recheck labs today and update patient. Continue current Vit D dose at this time.  8. Menopausal symptoms - discussed lifestyle modifications for symptomatic control including adequate hydration, diet, exercise. She is scheduled to see her GYN next week for annual exam. Will also check B12 for fatigue. Reports she is scheduled for her mammogram later this month.  9. IBS - patient states she is at her baseline. Reports colonoscopy with polyp removal earlier this year. She was told to repeat in 5 years.   Patient reminded of general precautions related to pandemic, benefit of having home BP/O2 monitoring capability for possible  illness and virtual visits.  Will update patient on lab results and any changes to plan of care. Follow-up in 6 months or sooner if needed.

## 2018-12-25 NOTE — Patient Instructions (Addendum)
Omron blood pressure cuff, upper arm  Check blood pressure and report any concerns   Pulse Oximeter, want oxygen in 90s  Atrantil for IBS   Preventive Care 80-49 Years Old, Female Preventive care refers to visits with your health care provider and lifestyle choices that can promote health and wellness. This includes:  A yearly physical exam. This may also be called an annual well check.  Regular dental visits and eye exams.  Immunizations.  Screening for certain conditions.  Healthy lifestyle choices, such as eating a healthy diet, getting regular exercise, not using drugs or products that contain nicotine and tobacco, and limiting alcohol use. What can I expect for my preventive care visit? Physical exam Your health care provider will check your:  Height and weight. This may be used to calculate body mass index (BMI), which tells if you are at a healthy weight.  Heart rate and blood pressure.  Skin for abnormal spots. Counseling Your health care provider may ask you questions about your:  Alcohol, tobacco, and drug use.  Emotional well-being.  Home and relationship well-being.  Sexual activity.  Eating habits.  Work and work Statistician.  Method of birth control.  Menstrual cycle.  Pregnancy history. What immunizations do I need?  Influenza (flu) vaccine  This is recommended every year. Tetanus, diphtheria, and pertussis (Tdap) vaccine  You may need a Td booster every 10 years. Varicella (chickenpox) vaccine  You may need this if you have not been vaccinated. Zoster (shingles) vaccine  You may need this after age 59. Measles, mumps, and rubella (MMR) vaccine  You may need at least one dose of MMR if you were born in 1957 or later. You may also need a second dose. Pneumococcal conjugate (PCV13) vaccine  You may need this if you have certain conditions and were not previously vaccinated. Pneumococcal polysaccharide (PPSV23) vaccine  You may need  one or two doses if you smoke cigarettes or if you have certain conditions. Meningococcal conjugate (MenACWY) vaccine  You may need this if you have certain conditions. Hepatitis A vaccine  You may need this if you have certain conditions or if you travel or work in places where you may be exposed to hepatitis A. Hepatitis B vaccine  You may need this if you have certain conditions or if you travel or work in places where you may be exposed to hepatitis B. Haemophilus influenzae type b (Hib) vaccine  You may need this if you have certain conditions. Human papillomavirus (HPV) vaccine  If recommended by your health care provider, you may need three doses over 6 months. You may receive vaccines as individual doses or as more than one vaccine together in one shot (combination vaccines). Talk with your health care provider about the risks and benefits of combination vaccines. What tests do I need? Blood tests  Lipid and cholesterol levels. These may be checked every 5 years, or more frequently if you are over 74 years old.  Hepatitis C test.  Hepatitis B test. Screening  Lung cancer screening. You may have this screening every year starting at age 44 if you have a 30-pack-year history of smoking and currently smoke or have quit within the past 15 years.  Colorectal cancer screening. All adults should have this screening starting at age 61 and continuing until age 43. Your health care provider may recommend screening at age 103 if you are at increased risk. You will have tests every 1-10 years, depending on your results and the type  of screening test.  Diabetes screening. This is done by checking your blood sugar (glucose) after you have not eaten for a while (fasting). You may have this done every 1-3 years.  Mammogram. This may be done every 1-2 years. Talk with your health care provider about when you should start having regular mammograms. This may depend on whether you have a family  history of breast cancer.  BRCA-related cancer screening. This may be done if you have a family history of breast, ovarian, tubal, or peritoneal cancers.  Pelvic exam and Pap test. This may be done every 3 years starting at age 54. Starting at age 60, this may be done every 5 years if you have a Pap test in combination with an HPV test. Other tests  Sexually transmitted disease (STD) testing.  Bone density scan. This is done to screen for osteoporosis. You may have this scan if you are at high risk for osteoporosis. Follow these instructions at home: Eating and drinking  Eat a diet that includes fresh fruits and vegetables, whole grains, lean protein, and low-fat dairy.  Take vitamin and mineral supplements as recommended by your health care provider.  Do not drink alcohol if: ? Your health care provider tells you not to drink. ? You are pregnant, may be pregnant, or are planning to become pregnant.  If you drink alcohol: ? Limit how much you have to 0-1 drink a day. ? Be aware of how much alcohol is in your drink. In the [place] one drink equals one 12 oz bottle of beer (355 mL), one 5 oz glass of wine (148 mL), or one 1 oz glass of hard liquor (44 mL). Lifestyle  Take daily care of your teeth and gums.  Stay active. Exercise for at least 30 minutes on 5 or more days each week.  Do not use any products that contain nicotine or tobacco, such as cigarettes, e-cigarettes, and chewing tobacco. If you need help quitting, ask your health care provider.  If you are sexually active, practice safe sex. Use a condom or other form of birth control (contraception) in order to prevent pregnancy and STIs (sexually transmitted infections).  If told by your health care provider, take low-dose aspirin daily starting at age 15. What's next?  Visit your health care provider once a year for a well check visit.  Ask your health care provider how often you should have your eyes and teeth checked.   Stay up to date on all vaccines. This information is not intended to replace advice given to you by your health care provider. Make sure you discuss any questions you have with your health care provider. Document Released: 03/27/2015 Document Revised: 11/09/2017 Document Reviewed: 11/09/2017 Elsevier Patient Education  2020 [employer].

## 2018-12-26 DIAGNOSIS — N951 Menopausal and female climacteric states: Secondary | ICD-10-CM | POA: Insufficient documentation

## 2018-12-26 MED ORDER — METFORMIN HCL 500 MG PO TABS: 250.0000 mg | ORAL_TABLET | Freq: Every day | ORAL | 0 refills | Status: DC

## 2018-12-26 NOTE — Assessment & Plan Note (Signed)
Encouraged DASH diet, decrease po intake and increase exercise as tolerated. Needs 7-8 hours of sleep nightly. Avoid trans fats, eat small, frequent meals every 4-5 hours with lean proteins, complex carbs and healthy fats. Minimize simple carbs, tried [facility]ness but was not successful for her. She is encouraged to consider NOOM or Weight Watchers APP for now.

## 2018-12-26 NOTE — Assessment & Plan Note (Signed)
Avoid offending foods, start probiotics. Do not eat large meals

## 2018-12-26 NOTE — Assessment & Plan Note (Signed)
Patient encouraged to maintain heart healthy diet, regular exercise, adequate sleep. Consider daily probiotics. Take medications as prescribed. Flu shot given. Labs ordered and reviewed. She follows with GYN and is seeing them soon for MGM, pap was normal last year.
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
## 2018-12-26 NOTE — Assessment & Plan Note (Signed)
Tolerating statin, encouraged heart healthy diet, avoid trans fats, minimize simple carbs and saturated fats. Increase exercise as tolerated 

## 2018-12-26 NOTE — Assessment & Plan Note (Signed)
Supplement and monitor 

## 2018-12-26 NOTE — Assessment & Plan Note (Signed)
In 2016, doing well and following with cardiology

## 2018-12-26 NOTE — Assessment & Plan Note (Signed)
Refills given on medications today

## 2018-12-26 NOTE — Assessment & Plan Note (Signed)
Continue to monitor

## 2018-12-26 NOTE — Assessment & Plan Note (Signed)
She is having some hot flashes, sleep disruption and decreased libido. She has started Amberin over the counter and feels it helps some. Can try Icool and reminded to minimize carbohydrates, exercise, and actively try to manage stress.

## 2018-12-26 NOTE — Assessment & Plan Note (Signed)
Patient feels the Wellbutrin with Ashwaghanda are helping enough. She requests Contrave but due to her history of depression she is not a good candidate. She is happy about the new job she has just gotten at [employer].

## 2018-12-26 NOTE — Assessment & Plan Note (Signed)
hgba1c acceptable, minimize simple carbs. Increase exercise as tolerated. Will drop Metformin to 250 mg to see if that helps her side effect of nausea and can try Ginger caps prior to dosing.

## 2018-12-27 ENCOUNTER — Telehealth: Payer: Self-pay | Admitting: Family Medicine

## 2018-12-27 NOTE — Telephone Encounter (Signed)
Called to schedule 6 month f/u visit (April 2021), no answer or machine

## 2019-01-01 ENCOUNTER — Other Ambulatory Visit: Payer: Self-pay

## 2019-01-02 ENCOUNTER — Encounter: Payer: Self-pay | Admitting: Women's Health

## 2019-01-02 ENCOUNTER — Ambulatory Visit
Admission: RE | Admit: 2019-01-02 | Discharge: 2019-01-02 | Disposition: A | Discharge status: Home / Self Care | Payer: No Typology Code available for payment source | Source: Ambulatory Visit | Attending: Family Medicine | Admitting: Family Medicine

## 2019-01-02 ENCOUNTER — Ambulatory Visit (INDEPENDENT_AMBULATORY_CARE_PROVIDER_SITE_OTHER): Payer: PRIVATE HEALTH INSURANCE | Admitting: Women's Health

## 2019-01-02 VITALS — BP 134/80 | Ht 64.0 in | Wt 206.0 lb

## 2019-01-02 DIAGNOSIS — Z1231 Encounter for screening mammogram for malignant neoplasm of breast: Secondary | ICD-10-CM

## 2019-01-02 DIAGNOSIS — Z01419 Encounter for gynecological examination (general) (routine) without abnormal findings: Secondary | ICD-10-CM | POA: Diagnosis not present

## 2019-01-02 NOTE — Patient Instructions (Addendum)
Call if menstrual cycle   BRCA Gene Testing Why am I having this test? BRCA gene testing is done to check for the presence of harmful changes (mutations) in the BRCA1 gene or the BRCA2 gene (breast cancer susceptibility genes). If there is a mutation, the genes may not be able to help repair damaged cells in the body. As a result, the damaged cells may develop defects that can lead to certain types of cancer. You may have this test if you have a family history of certain types of cancer, including cancer of the:  Breast.  Ovaries.  Fallopian tubes.  Peritoneum.  Pancreas.  Prostate. What kind of sample is taken?     The test requires either a sample of blood or a sample of cells from your saliva. If a sample of blood is needed, it will probably be collected by inserting a needle into a vein. If a sample of saliva is needed, you will get instructions about how to collect the sample. What do the results mean? The test results can show whether you have a mutation in the BRCA1 or BRCA2 gene that increases your risk for certain cancers. Meaning of negative test results A negative test result means that you do not have a mutation in the BRCA1 or BRCA2 gene that is known to increase your risk for certain cancers. This does not mean that you will never get cancer. Talk with your health care provider or a genetic counselor about what this result means for you. Meaning of positive test results A positive test result means that you have a mutation in the BRCA1 or BRCA2 gene that increases your risk for certain cancers. Women with a positive test result have an increased risk for breast and ovarian cancer. Both women and men with a mutation have an increased risk for breast cancer and may be at greater risk for other types of cancer. Getting a positive test result does not mean that you will develop cancer. Talk with your health care provider or a genetic counselor about what this result means for  you. You may be told that you are a carrier. This means that you can pass the mutation to your children. Meaning of ambiguous test results Ambiguous, inconclusive, or uncertain test results mean that there is a change in the BRCA1 or BRCA2 gene, but it is a change that has not been linked to cancer. Talk with your health care provider or a genetic counselor about what this result means for you. Talk with your health care provider to discuss your results, treatment options, and if necessary, the need for more tests. Talk with your health care provider if you have any questions about your results. How do I get my results? It is up to you to get your test results. Ask your health care provider, or the department that is doing the test, when your results will be ready. This information is not intended to replace advice given to you by your health care provider. Make sure you discuss any questions you have with your health care provider. Document Released: 03/24/2004 Document Revised: 09/25/2017 Document Reviewed: 10/21/2015 Elsevier Patient Education  2020 [place] Maintenance, Female Adopting a healthy lifestyle and getting preventive care are important in promoting health and wellness. Ask your health care provider about:  The right schedule for you to have regular tests and exams.  Things you can do on your own to prevent diseases and keep yourself healthy. What should I know  about diet, weight, and exercise? Eat a healthy diet   Eat a diet that includes plenty of vegetables, fruits, low-fat dairy products, and lean protein.  Do not eat a lot of foods that are high in solid fats, added sugars, or sodium. Maintain a healthy weight Body mass index (BMI) is used to identify weight problems. It estimates body fat based on height and weight. Your health care provider can help determine your BMI and help you achieve or maintain a healthy weight. Get regular exercise Get regular  exercise. This is one of the most important things you can do for your health. Most adults should:  Exercise for at least 150 minutes each week. The exercise should increase your heart rate and make you sweat (moderate-intensity exercise).  Do strengthening exercises at least twice a week. This is in addition to the moderate-intensity exercise.  Spend less time sitting. Even light physical activity can be beneficial. Watch cholesterol and blood lipids Have your blood tested for lipids and cholesterol at 49 years of age, then have this test every 5 years. Have your cholesterol levels checked more often if:  Your lipid or cholesterol levels are high.  You are older than 49 years of age.  You are at high risk for heart disease. What should I know about cancer screening? Depending on your health history and family history, you may need to have cancer screening at various ages. This may include screening for:  Breast cancer.  Cervical cancer.  Colorectal cancer.  Skin cancer.  Lung cancer. What should I know about heart disease, diabetes, and high blood pressure? Blood pressure and heart disease  High blood pressure causes heart disease and increases the risk of stroke. This is more likely to develop in people who have high blood pressure readings, are of African descent, or are overweight.  Have your blood pressure checked: ? Every 3-5 years if you are 22-81 years of age. ? Every year if you are 79 years old or older. Diabetes Have regular diabetes screenings. This checks your fasting blood sugar level. Have the screening done:  Once every three years after age 8 if you are at a normal weight and have a low risk for diabetes.  More often and at a younger age if you are overweight or have a high risk for diabetes. What should I know about preventing infection? Hepatitis B If you have a higher risk for hepatitis B, you should be screened for this virus. Talk with your health  care provider to find out if you are at risk for hepatitis B infection. Hepatitis C Testing is recommended for:  Everyone born from [birth year] through 1966.  Anyone with known risk factors for hepatitis C. Sexually transmitted infections (STIs)  Get screened for STIs, including gonorrhea and chlamydia, if: ? You are sexually active and are younger than 49 years of age. ? You are older than 49 years of age and your health care provider tells you that you are at risk for this type of infection. ? Your sexual activity has changed since you were last screened, and you are at increased risk for chlamydia or gonorrhea. Ask your health care provider if you are at risk.  Ask your health care provider about whether you are at high risk for HIV. Your health care provider may recommend a prescription medicine to help prevent HIV infection. If you choose to take medicine to prevent HIV, you should first get tested for HIV. You should then be tested  every 3 months for as long as you are taking the medicine. Pregnancy  If you are about to stop having your period (premenopausal) and you may become pregnant, seek counseling before you get pregnant.  Take 400 to 800 micrograms (mcg) of folic acid every day if you become pregnant.  Ask for birth control (contraception) if you want to prevent pregnancy. Osteoporosis and menopause Osteoporosis is a disease in which the bones lose minerals and strength with aging. This can result in bone fractures. If you are 63 years old or older, or if you are at risk for osteoporosis and fractures, ask your health care provider if you should:  Be screened for bone loss.  Take a calcium or vitamin D supplement to lower your risk of fractures.  Be given hormone replacement therapy (HRT) to treat symptoms of menopause. Follow these instructions at home: Lifestyle  Do not use any products that contain nicotine or tobacco, such as cigarettes, e-cigarettes, and chewing tobacco.  If you need help quitting, ask your health care provider.  Do not use street drugs.  Do not share needles.  Ask your health care provider for help if you need support or information about quitting drugs. Alcohol use  Do not drink alcohol if: ? Your health care provider tells you not to drink. ? You are pregnant, may be pregnant, or are planning to become pregnant.  If you drink alcohol: ? Limit how much you use to 0-1 drink a day. ? Limit intake if you are breastfeeding.  Be aware of how much alcohol is in your drink. In the [place] one drink equals one 12 oz bottle of beer (355 mL), one 5 oz glass of wine (148 mL), or one 1 oz glass of hard liquor (44 mL). General instructions  Schedule regular health, dental, and eye exams.  Stay current with your vaccines.  Tell your health care provider if: ? You often feel depressed. ? You have ever been abused or do not feel safe at home. Summary  Adopting a healthy lifestyle and getting preventive care are important in promoting health and wellness.  Follow your health care provider's instructions about healthy diet, exercising, and getting tested or screened for diseases.  Follow your health care provider's instructions on monitoring your cholesterol and blood pressure. This information is not intended to replace advice given to you by your health care provider. Make sure you discuss any questions you have with your health care provider. Document Released: 09/13/2010 Document Revised: 02/21/2018 Document Reviewed: 02/21/2018 Elsevier Patient Education  2020 [employer].
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
## 2019-01-03 LAB — URINALYSIS, COMPLETE W/RFL CULTURE
Bacteria, UA: NONE SEEN /HPF
Bilirubin Urine: NEGATIVE
Glucose, UA: NEGATIVE
Hgb urine dipstick: NEGATIVE
Hyaline Cast: NONE SEEN /LPF
Ketones, ur: NEGATIVE
Leukocyte Esterase: NEGATIVE
Nitrites, Initial: NEGATIVE
Protein, ur: NEGATIVE
Specific Gravity, Urine: 1.016 (ref 1.001–1.03)
pH: 6 (ref 5.0–8.0)

## 2019-01-03 LAB — CULTURE INDICATED

## 2019-01-14 ENCOUNTER — Encounter: Payer: Self-pay | Admitting: Cardiology

## 2019-01-14 ENCOUNTER — Ambulatory Visit: Payer: No Typology Code available for payment source | Admitting: Cardiology

## 2019-01-14 ENCOUNTER — Other Ambulatory Visit: Payer: Self-pay

## 2019-01-14 ENCOUNTER — Ambulatory Visit (INDEPENDENT_AMBULATORY_CARE_PROVIDER_SITE_OTHER): Payer: No Typology Code available for payment source | Admitting: *Deleted

## 2019-01-14 VITALS — BP 136/60 | HR 64 | Ht 64.0 in | Wt 209.6 lb

## 2019-01-14 DIAGNOSIS — Z952 Presence of prosthetic heart valve: Secondary | ICD-10-CM

## 2019-01-14 DIAGNOSIS — E7800 Pure hypercholesterolemia, unspecified: Secondary | ICD-10-CM | POA: Diagnosis not present

## 2019-01-14 DIAGNOSIS — I351 Nonrheumatic aortic (valve) insufficiency: Secondary | ICD-10-CM | POA: Diagnosis not present

## 2019-01-14 DIAGNOSIS — Z5181 Encounter for therapeutic drug level monitoring: Secondary | ICD-10-CM | POA: Diagnosis not present

## 2019-01-14 DIAGNOSIS — Q244 Congenital subaortic stenosis: Secondary | ICD-10-CM

## 2019-01-14 DIAGNOSIS — I493 Ventricular premature depolarization: Secondary | ICD-10-CM | POA: Diagnosis not present

## 2019-01-14 LAB — POCT INR: INR: 2.3 (ref 2.0–3.0)

## 2019-01-14 MED ORDER — METOPROLOL TARTRATE 50 MG PO TABS: 50.0000 mg | ORAL_TABLET | Freq: Two times a day (BID) | ORAL | 3 refills | Status: DC

## 2019-01-14 NOTE — Patient Instructions (Signed)
Medication Instructions:  Your provider recommends that you continue on your current medications as directed. Please refer to the Current Medication list given to you today.   *If you need a refill on your cardiac medications before your next appointment, please call your pharmacy*  Testing/Procedures: Your provider has requested that you have an echocardiogram. Echocardiography is a painless test that uses sound waves to create images of your heart. It provides your doctor with information about the size and shape of your heart and how well your heart's chambers and valves are working. This procedure takes approximately one hour. There are no restrictions for this procedure.  Follow-Up: At [facility], you and your health needs are our priority.  As part of our continuing mission to provide you with exceptional heart care, we have created designated Provider Care Teams.  These Care Teams include your primary Cardiologist (physician) and Advanced Practice Providers (APPs -  Physician Assistants and Nurse Practitioners) who all work together to provide you with the care you need, when you need it. Your next appointment:   12 months The format for your next appointment:   In Person Provider:   You may see Fransico Him, MD or one of the following Advanced Practice Providers on your designated Care Team:    Melina Copa, PA-C  Ermalinda Barrios, PA-C
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
## 2019-01-14 NOTE — Patient Instructions (Addendum)
Description   Continue taking 1 tablet (5mg ) daily. Recheck INR in 5 (normally 8) weeks.   Call Coumadin clinic with any new medications [phone], fax [phone].

## 2019-03-19 ENCOUNTER — Encounter: Payer: Self-pay | Admitting: Family Medicine

## 2019-03-19 DIAGNOSIS — Z0000 Encounter for general adult medical examination without abnormal findings: Secondary | ICD-10-CM

## 2019-03-19 DIAGNOSIS — R739 Hyperglycemia, unspecified: Secondary | ICD-10-CM

## 2019-03-19 DIAGNOSIS — E785 Hyperlipidemia, unspecified: Secondary | ICD-10-CM

## 2019-03-25 ENCOUNTER — Other Ambulatory Visit (HOSPITAL_COMMUNITY): Payer: No Typology Code available for payment source

## 2019-03-25 MED ORDER — BUPROPION HCL ER (XL) 300 MG PO TB24: 300.0000 mg | ORAL_TABLET | Freq: Every day | ORAL | 1 refills | Status: DC

## 2019-04-15 ENCOUNTER — Encounter: Payer: Self-pay | Admitting: Family Medicine

## 2019-04-16 MED ORDER — AMPHETAMINE-DEXTROAMPHETAMINE 20 MG PO TABS: 20.0000 mg | ORAL_TABLET | Freq: Three times a day (TID) | ORAL | 0 refills | Status: DC

## 2019-04-16 NOTE — Telephone Encounter (Signed)
Requesting:Adderall Contract:03/17/2018 UDS:06/11/2018 Last Visit:12/25/2018 Next Visit:none scheduled Last Refill:12/25/2018  Please Advise

## 2019-05-14 ENCOUNTER — Other Ambulatory Visit: Payer: Self-pay | Admitting: Women's Health

## 2019-05-14 DIAGNOSIS — N950 Postmenopausal bleeding: Secondary | ICD-10-CM

## 2019-05-14 NOTE — Progress Notes (Signed)
us

## 2019-05-16 ENCOUNTER — Encounter: Payer: Self-pay | Admitting: Family Medicine

## 2019-05-16 ENCOUNTER — Other Ambulatory Visit: Payer: Self-pay | Admitting: Family Medicine

## 2019-05-16 NOTE — Telephone Encounter (Signed)
Requesting:adderall  Contract:yes UDS:n/a Last OV:12/25/18 Next OV:n/a Last Refill:04/16/19  #[id number] Database:   Please advise

## 2019-06-17 ENCOUNTER — Encounter: Payer: Self-pay | Admitting: Family Medicine

## 2019-06-17 ENCOUNTER — Other Ambulatory Visit: Payer: Self-pay | Admitting: Family Medicine

## 2019-06-17 MED ORDER — AMPHETAMINE-DEXTROAMPHETAMINE 20 MG PO TABS: 20.0000 mg | ORAL_TABLET | Freq: Three times a day (TID) | ORAL | 0 refills | Status: DC

## 2019-06-18 ENCOUNTER — Other Ambulatory Visit: Payer: Self-pay | Admitting: Family Medicine

## 2019-06-18 MED ORDER — AMPHETAMINE-DEXTROAMPHETAMINE 20 MG PO TABS: 20.0000 mg | ORAL_TABLET | Freq: Two times a day (BID) | ORAL | 0 refills | Status: DC

## 2019-06-18 NOTE — Telephone Encounter (Signed)
Refill was not sent in.  Can you send in?

## 2019-06-18 NOTE — Telephone Encounter (Signed)
Can you delete this medication it will not allow me to remove from my basket

## 2019-06-26 ENCOUNTER — Telehealth: Payer: Self-pay

## 2019-06-26 NOTE — Telephone Encounter (Signed)
Patient called in to see if Dr. Charlett Blake could send in a prescription for  amphetamine-dextroamphetamine (ADDERALL) 20 MG tablet [id number]    Please send it to [address] [place], [place] [address]  Phone:  [phone] Fax:  [phone]  DEA #:  --   Patient is requesting this because she will run out of medication before her next appoinment on August 15, 2019 at 10:40 am

## 2019-06-26 NOTE — Telephone Encounter (Signed)
Can you review the part of her message about her wanting amoxicillin for a dental appointment.

## 2019-06-27 ENCOUNTER — Other Ambulatory Visit: Payer: Self-pay | Admitting: Family Medicine

## 2019-06-27 MED ORDER — AMOXICILLIN 500 MG PO CAPS: 2000.0000 mg | ORAL_CAPSULE | Freq: Once | ORAL | 1 refills | Status: AC

## 2019-06-27 NOTE — Telephone Encounter (Signed)
April prescription was sent in on April 6th. I will make sure she has enough medication to last until her next visit.

## 2019-07-16 ENCOUNTER — Encounter: Payer: Self-pay | Admitting: Family Medicine

## 2019-07-16 ENCOUNTER — Other Ambulatory Visit: Payer: Self-pay | Admitting: Family Medicine

## 2019-07-16 DIAGNOSIS — Z0000 Encounter for general adult medical examination without abnormal findings: Secondary | ICD-10-CM

## 2019-07-16 DIAGNOSIS — E785 Hyperlipidemia, unspecified: Secondary | ICD-10-CM

## 2019-07-16 DIAGNOSIS — R739 Hyperglycemia, unspecified: Secondary | ICD-10-CM

## 2019-07-16 MED ORDER — AMPHETAMINE-DEXTROAMPHETAMINE 20 MG PO TABS: 20.0000 mg | ORAL_TABLET | Freq: Two times a day (BID) | ORAL | 0 refills | Status: DC

## 2019-07-16 MED ORDER — BUPROPION HCL ER (XL) 300 MG PO TB24: 300.0000 mg | ORAL_TABLET | Freq: Every day | ORAL | 0 refills | Status: DC

## 2019-07-18 ENCOUNTER — Other Ambulatory Visit: Payer: Self-pay | Admitting: Family Medicine

## 2019-07-18 MED ORDER — AMPHETAMINE-DEXTROAMPHETAMINE 20 MG PO TABS: 20.0000 mg | ORAL_TABLET | Freq: Three times a day (TID) | ORAL | 0 refills | Status: DC

## 2019-07-19 ENCOUNTER — Other Ambulatory Visit: Payer: Self-pay | Admitting: Family Medicine

## 2019-07-21 ENCOUNTER — Other Ambulatory Visit: Payer: Self-pay | Admitting: Family Medicine

## 2019-07-21 MED ORDER — AMPHETAMINE-DEXTROAMPHETAMINE 20 MG PO TABS: 20.0000 mg | ORAL_TABLET | Freq: Three times a day (TID) | ORAL | 0 refills | Status: DC

## 2019-07-23 ENCOUNTER — Other Ambulatory Visit: Payer: Self-pay | Admitting: Family Medicine

## 2019-08-15 ENCOUNTER — Ambulatory Visit: Payer: No Typology Code available for payment source | Admitting: Family Medicine

## 2019-08-15 ENCOUNTER — Other Ambulatory Visit: Payer: Self-pay

## 2019-08-15 VITALS — BP 127/74 | HR 58 | Temp 98.4°F | Resp 16 | Ht 64.0 in | Wt 203.0 lb

## 2019-08-15 DIAGNOSIS — E8881 Metabolic syndrome: Secondary | ICD-10-CM

## 2019-08-15 DIAGNOSIS — N950 Postmenopausal bleeding: Secondary | ICD-10-CM

## 2019-08-15 DIAGNOSIS — Z780 Asymptomatic menopausal state: Secondary | ICD-10-CM

## 2019-08-15 DIAGNOSIS — R7989 Other specified abnormal findings of blood chemistry: Secondary | ICD-10-CM | POA: Diagnosis not present

## 2019-08-15 DIAGNOSIS — R7303 Prediabetes: Secondary | ICD-10-CM

## 2019-08-15 DIAGNOSIS — F988 Other specified behavioral and emotional disorders with onset usually occurring in childhood and adolescence: Secondary | ICD-10-CM

## 2019-08-15 DIAGNOSIS — E88819 Insulin resistance, unspecified: Secondary | ICD-10-CM

## 2019-08-15 DIAGNOSIS — E7849 Other hyperlipidemia: Secondary | ICD-10-CM | POA: Diagnosis not present

## 2019-08-15 DIAGNOSIS — E559 Vitamin D deficiency, unspecified: Secondary | ICD-10-CM

## 2019-08-15 DIAGNOSIS — E7800 Pure hypercholesterolemia, unspecified: Secondary | ICD-10-CM

## 2019-08-15 LAB — CBC
HCT: 38.3 % (ref 36.0–46.0)
Hemoglobin: 13.1 g/dL (ref 12.0–15.0)
MCHC: 34.1 g/dL (ref 30.0–36.0)
MCV: 94.8 fl (ref 78.0–100.0)
Platelets: 249 10*3/uL (ref 150.0–400.0)
RBC: 4.04 Mil/uL (ref 3.87–5.11)
RDW: 13.4 % (ref 11.5–15.5)
WBC: 7.4 10*3/uL (ref 4.0–10.5)

## 2019-08-15 LAB — COMPREHENSIVE METABOLIC PANEL
ALT: 26 U/L (ref 0–35)
AST: 20 U/L (ref 0–37)
Albumin: 4.5 g/dL (ref 3.5–5.2)
Alkaline Phosphatase: 47 U/L (ref 39–117)
BUN: 16 mg/dL (ref 6–23)
CO2: 28 mEq/L (ref 19–32)
Calcium: 9.8 mg/dL (ref 8.4–10.5)
Chloride: 100 mEq/L (ref 96–112)
Creatinine, Ser: 0.7 mg/dL (ref 0.40–1.20)
GFR: 88.6 mL/min (ref >60.00)
Glucose, Bld: 98 mg/dL (ref 70–99)
Potassium: 4.8 mEq/L (ref 3.5–5.1)
Sodium: 136 mEq/L (ref 135–145)
Total Bilirubin: 0.5 mg/dL (ref 0.2–1.2)
Total Protein: 7.1 g/dL (ref 6.0–8.3)

## 2019-08-15 LAB — LIPID PANEL
Cholesterol: 209 mg/dL — ABNORMAL HIGH (ref 0–200)
HDL: 47.5 mg/dL (ref >39.00)
LDL Cholesterol: 130 mg/dL — ABNORMAL HIGH (ref 0–99)
NonHDL: 161.85
Total CHOL/HDL Ratio: 4
Triglycerides: 158 mg/dL — ABNORMAL HIGH (ref 0.0–149.0)
VLDL: 31.6 mg/dL (ref 0.0–40.0)

## 2019-08-15 LAB — HEMOGLOBIN A1C: Hgb A1c MFr Bld: 5.7 % (ref 4.6–6.5)

## 2019-08-15 LAB — TSH: TSH: 2.7 u[IU]/mL (ref 0.35–4.50)

## 2019-08-15 LAB — VITAMIN B12: Vitamin B-12: 472 pg/mL (ref 211–911)

## 2019-08-15 MED ORDER — AMPHETAMINE-DEXTROAMPHETAMINE 20 MG PO TABS: 20.0000 mg | ORAL_TABLET | Freq: Three times a day (TID) | ORAL | 0 refills | Status: DC

## 2019-08-15 NOTE — Patient Instructions (Signed)
Add Benefiber once to twice a day  MIND diet

## 2019-08-16 ENCOUNTER — Other Ambulatory Visit: Payer: Self-pay | Admitting: *Deleted

## 2019-08-16 DIAGNOSIS — E785 Hyperlipidemia, unspecified: Secondary | ICD-10-CM

## 2019-08-16 MED ORDER — ATORVASTATIN CALCIUM 40 MG PO TABS
40.0000 mg | ORAL_TABLET | Freq: Every day | ORAL | 1 refills | Status: DC
Start: 2019-08-16 — End: 2019-08-16

## 2019-08-16 MED ORDER — ATORVASTATIN CALCIUM 40 MG PO TABS
40.0000 mg | ORAL_TABLET | Freq: Every day | ORAL | 1 refills | Status: DC
Start: 2019-08-16 — End: 2020-08-14

## 2019-08-16 NOTE — Addendum Note (Signed)
Addended by: Kem Boroughs D on: 08/16/2019 03:09 PM   Modules accepted: Orders

## 2019-08-16 NOTE — Progress Notes (Signed)
t

## 2019-08-17 LAB — VITAMIN D 1,25 DIHYDROXY
Vitamin D 1, 25 (OH)2 Total: 51 pg/mL (ref 18–72)
Vitamin D2 1, 25 (OH)2: 21 pg/mL
Vitamin D3 1, 25 (OH)2: 30 pg/mL

## 2019-08-18 DIAGNOSIS — N950 Postmenopausal bleeding: Secondary | ICD-10-CM | POA: Insufficient documentation

## 2019-08-18 LAB — DRUG MONITORING, PANEL 8 WITH CONFIRMATION, URINE
6 Acetylmorphine: NEGATIVE ng/mL (ref <10)
Alcohol Metabolites: POSITIVE ng/mL — AB
Amphetamine: 1286 ng/mL — ABNORMAL HIGH (ref <250)
Amphetamines: POSITIVE ng/mL — AB (ref <500)
Benzodiazepines: NEGATIVE ng/mL (ref <100)
Buprenorphine, Urine: NEGATIVE ng/mL (ref <5)
Cocaine Metabolite: NEGATIVE ng/mL (ref <150)
Creatinine: 38.2 mg/dL
Ethyl Glucuronide (ETG): 2137 ng/mL — ABNORMAL HIGH (ref <500)
Ethyl Sulfate (ETS): 450 ng/mL — ABNORMAL HIGH (ref <100)
MDMA: NEGATIVE ng/mL (ref <500)
Marijuana Metabolite: NEGATIVE ng/mL (ref <20)
Methamphetamine: NEGATIVE ng/mL (ref <250)
Opiates: NEGATIVE ng/mL (ref <100)
Oxidant: NEGATIVE ug/mL
Oxycodone: NEGATIVE ng/mL (ref <100)
pH: 7.4 (ref 4.5–9.0)

## 2019-08-18 NOTE — Assessment & Plan Note (Signed)
Encouraged heart healthy diet, increase exercise, avoid trans fats, consider a krill oil cap daily 

## 2019-08-18 NOTE — Assessment & Plan Note (Signed)
Doing well on current dose of Adderall refills given today

## 2019-08-18 NOTE — Assessment & Plan Note (Signed)
[id number] acceptable, minimize simple carbs. Increase exercise as tolerated.  

## 2019-08-18 NOTE — Assessment & Plan Note (Signed)
Tolerating statin, encouraged heart healthy diet, avoid trans fats, minimize simple carbs and saturated fats. Increase exercise as tolerated 

## 2019-08-18 NOTE — Assessment & Plan Note (Signed)
Her previous NP at GYN has retired new referral is placed
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
## 2019-08-18 NOTE — Assessment & Plan Note (Signed)
Supplement and monitor 

## 2019-08-18 NOTE — Assessment & Plan Note (Signed)
monitor

## 2019-08-30 ENCOUNTER — Encounter: Payer: Self-pay | Admitting: Family Medicine

## 2019-11-05 ENCOUNTER — Other Ambulatory Visit: Payer: Self-pay | Admitting: *Deleted

## 2019-11-05 MED ORDER — OMEPRAZOLE 40 MG PO CPDR: DELAYED_RELEASE_CAPSULE | ORAL | 1 refills | Status: DC

## 2019-11-11 ENCOUNTER — Encounter: Payer: Self-pay | Admitting: Family Medicine

## 2019-11-12 ENCOUNTER — Other Ambulatory Visit: Payer: Self-pay | Admitting: Family Medicine

## 2019-11-12 MED ORDER — AMPHETAMINE-DEXTROAMPHETAMINE 20 MG PO TABS
20.0000 mg | ORAL_TABLET | Freq: Three times a day (TID) | ORAL | 0 refills | Status: DC
Start: 2019-11-12 — End: 2019-11-13

## 2019-11-12 MED ORDER — AMPHETAMINE-DEXTROAMPHETAMINE 20 MG PO TABS: 20.0000 mg | ORAL_TABLET | Freq: Three times a day (TID) | ORAL | 0 refills | Status: DC

## 2019-11-13 ENCOUNTER — Other Ambulatory Visit: Payer: Self-pay | Admitting: Family Medicine

## 2019-11-13 ENCOUNTER — Telehealth: Payer: Self-pay | Admitting: Family Medicine

## 2019-11-13 MED ORDER — AMPHETAMINE-DEXTROAMPHETAMINE 20 MG PO TABS
20.0000 mg | ORAL_TABLET | Freq: Three times a day (TID) | ORAL | 0 refills | Status: DC
Start: 2019-11-13 — End: 2020-01-23

## 2019-11-13 NOTE — Telephone Encounter (Signed)
amphetamine-dextroamphetamine (ADDERALL) 20 MG tablet [[id number]   Medication sent to the wrong pharmacy. Patient would like to go to  [facility].   [place] [id number] [phone]

## 2020-01-22 ENCOUNTER — Encounter: Payer: Self-pay | Admitting: Family Medicine

## 2020-01-23 ENCOUNTER — Encounter: Payer: Self-pay | Admitting: Family Medicine

## 2020-01-23 ENCOUNTER — Ambulatory Visit (INDEPENDENT_AMBULATORY_CARE_PROVIDER_SITE_OTHER): Payer: PRIVATE HEALTH INSURANCE | Admitting: Family Medicine

## 2020-01-23 ENCOUNTER — Other Ambulatory Visit: Payer: Self-pay

## 2020-01-23 VITALS — BP 136/74 | HR 71 | Temp 97.8°F | Resp 16 | Wt 208.0 lb

## 2020-01-23 DIAGNOSIS — F32A Depression, unspecified: Secondary | ICD-10-CM

## 2020-01-23 DIAGNOSIS — Q244 Congenital subaortic stenosis: Secondary | ICD-10-CM

## 2020-01-23 DIAGNOSIS — M2550 Pain in unspecified joint: Secondary | ICD-10-CM

## 2020-01-23 DIAGNOSIS — F988 Other specified behavioral and emotional disorders with onset usually occurring in childhood and adolescence: Secondary | ICD-10-CM

## 2020-01-23 DIAGNOSIS — R7989 Other specified abnormal findings of blood chemistry: Secondary | ICD-10-CM | POA: Diagnosis not present

## 2020-01-23 DIAGNOSIS — E559 Vitamin D deficiency, unspecified: Secondary | ICD-10-CM | POA: Diagnosis not present

## 2020-01-23 DIAGNOSIS — E785 Hyperlipidemia, unspecified: Secondary | ICD-10-CM | POA: Diagnosis not present

## 2020-01-23 DIAGNOSIS — R002 Palpitations: Secondary | ICD-10-CM

## 2020-01-23 DIAGNOSIS — E8881 Metabolic syndrome: Secondary | ICD-10-CM | POA: Diagnosis not present

## 2020-01-23 DIAGNOSIS — F419 Anxiety disorder, unspecified: Secondary | ICD-10-CM

## 2020-01-23 DIAGNOSIS — E669 Obesity, unspecified: Secondary | ICD-10-CM

## 2020-01-23 LAB — HEMOGLOBIN A1C: Hgb A1c MFr Bld: 5.8 % (ref 4.6–6.5)

## 2020-01-23 LAB — CBC
HCT: 39.5 % (ref 36.0–46.0)
Hemoglobin: 13.3 g/dL (ref 12.0–15.0)
MCHC: 33.7 g/dL (ref 30.0–36.0)
MCV: 94.4 fl (ref 78.0–100.0)
Platelets: 258 10*3/uL (ref 150.0–400.0)
RBC: 4.19 Mil/uL (ref 3.87–5.11)
RDW: 12.7 % (ref 11.5–15.5)
WBC: 6.8 10*3/uL (ref 4.0–10.5)

## 2020-01-23 LAB — LIPID PANEL
Cholesterol: 180 mg/dL (ref 0–200)
HDL: 46.6 mg/dL (ref >39.00)
LDL Cholesterol: 105 mg/dL — ABNORMAL HIGH (ref 0–99)
NonHDL: 133.3
Total CHOL/HDL Ratio: 4
Triglycerides: 143 mg/dL (ref 0.0–149.0)
VLDL: 28.6 mg/dL (ref 0.0–40.0)

## 2020-01-23 LAB — COMPREHENSIVE METABOLIC PANEL
ALT: 25 U/L (ref 0–35)
AST: 21 U/L (ref 0–37)
Albumin: 4.5 g/dL (ref 3.5–5.2)
Alkaline Phosphatase: 68 U/L (ref 39–117)
BUN: 17 mg/dL (ref 6–23)
CO2: 30 mEq/L (ref 19–32)
Calcium: 9.1 mg/dL (ref 8.4–10.5)
Chloride: 102 mEq/L (ref 96–112)
Creatinine, Ser: 0.86 mg/dL (ref 0.40–1.20)
GFR: 78.8 mL/min (ref >60.00)
Glucose, Bld: 100 mg/dL — ABNORMAL HIGH (ref 70–99)
Potassium: 4 mEq/L (ref 3.5–5.1)
Sodium: 138 mEq/L (ref 135–145)
Total Bilirubin: 0.4 mg/dL (ref 0.2–1.2)
Total Protein: 6.9 g/dL (ref 6.0–8.3)

## 2020-01-23 LAB — VITAMIN D 25 HYDROXY (VIT D DEFICIENCY, FRACTURES): VITD: 33.23 ng/mL (ref 30.00–100.00)

## 2020-01-23 LAB — VITAMIN B12: Vitamin B-12: 346 pg/mL (ref 211–911)

## 2020-01-23 LAB — TSH: TSH: 4.11 u[IU]/mL (ref 0.35–4.50)

## 2020-01-23 MED ORDER — AMPHETAMINE-DEXTROAMPHETAMINE 20 MG PO TABS
20.0000 mg | ORAL_TABLET | Freq: Three times a day (TID) | ORAL | 0 refills | Status: DC
Start: 2020-01-23 — End: 2020-05-11

## 2020-01-23 MED ORDER — OMEPRAZOLE 40 MG PO CPDR: DELAYED_RELEASE_CAPSULE | ORAL | 1 refills | Status: DC

## 2020-01-23 MED ORDER — SAXENDA 18 MG/3ML ~~LOC~~ SOPN: PEN_INJECTOR | SUBCUTANEOUS | 1 refills | Status: AC

## 2020-01-23 NOTE — Assessment & Plan Note (Signed)
Patient declines referral for counseling or change in medications today but will let us know if she changes her mind

## 2020-01-23 NOTE — Assessment & Plan Note (Signed)
hgba1c acceptable, minimize simple carbs. Increase exercise as tolerated. Continue current meds 

## 2020-01-23 NOTE — Assessment & Plan Note (Signed)
Tolerating statin, encouraged heart healthy diet, avoid trans fats, minimize simple carbs and saturated fats. Increase exercise as tolerated 

## 2020-01-23 NOTE — Assessment & Plan Note (Signed)
Supplement and monitor 

## 2020-01-23 NOTE — Assessment & Plan Note (Signed)
monitor
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
## 2020-01-23 NOTE — Patient Instructions (Signed)
MIND diet  DASH Eating Plan DASH stands for "Dietary Approaches to Stop Hypertension." The DASH eating plan is a healthy eating plan that has been shown to reduce high blood pressure (hypertension). It may also reduce your risk for type 2 diabetes, heart disease, and stroke. The DASH eating plan may also help with weight loss. What are tips for following this plan?  General guidelines  Avoid eating more than 2,300 mg (milligrams) of salt (sodium) a day. If you have hypertension, you may need to reduce your sodium intake to 1,500 mg a day.  Limit alcohol intake to no more than 1 drink a day for nonpregnant women and 2 drinks a day for men. One drink equals 12 oz of beer, 5 oz of wine, or 1 oz of hard liquor.  Work with your health care provider to maintain a healthy body weight or to lose weight. Ask what an ideal weight is for you.  Get at least 30 minutes of exercise that causes your heart to beat faster (aerobic exercise) most days of the week. Activities may include walking, swimming, or biking.  Work with your health care provider or diet and nutrition specialist (dietitian) to adjust your eating plan to your individual calorie needs. Reading food labels   Check food labels for the amount of sodium per serving. Choose foods with less than 5 percent of the Daily Value of sodium. Generally, foods with less than 300 mg of sodium per serving fit into this eating plan.  To find whole grains, look for the word "whole" as the first word in the ingredient list. Shopping  Buy products labeled as "low-sodium" or "no salt added."  Buy fresh foods. Avoid canned foods and premade or frozen meals. Cooking  Avoid adding salt when cooking. Use salt-free seasonings or herbs instead of table salt or sea salt. Check with your health care provider or pharmacist before using salt substitutes.  Do not fry foods. Cook foods using healthy methods such as baking, boiling, grilling, and broiling  instead.  Cook with heart-healthy oils, such as olive, canola, soybean, or sunflower oil. Meal planning  Eat a balanced diet that includes: ? 5 or more servings of fruits and vegetables each day. At each meal, try to fill half of your plate with fruits and vegetables. ? Up to 6-8 servings of whole grains each day. ? Less than 6 oz of lean meat, poultry, or fish each day. A 3-oz serving of meat is about the same size as a deck of cards. One egg equals 1 oz. ? 2 servings of low-fat dairy each day. ? A serving of nuts, seeds, or beans 5 times each week. ? Heart-healthy fats. Healthy fats called Omega-3 fatty acids are found in foods such as flaxseeds and coldwater fish, like sardines, salmon, and mackerel.  Limit how much you eat of the following: ? Canned or prepackaged foods. ? Food that is high in trans fat, such as fried foods. ? Food that is high in saturated fat, such as fatty meat. ? Sweets, desserts, sugary drinks, and other foods with added sugar. ? Full-fat dairy products.  Do not salt foods before eating.  Try to eat at least 2 vegetarian meals each week.  Eat more home-cooked food and less restaurant, buffet, and fast food.  When eating at a restaurant, ask that your food be prepared with less salt or no salt, if possible. What foods are recommended? The items listed may not be a complete list. Talk with  your dietitian about what dietary choices are best for you. Grains Whole-grain or whole-wheat bread. Whole-grain or whole-wheat pasta. Brown rice. [place]. Bulgur. Whole-grain and low-sodium cereals. Pita bread. Low-fat, low-sodium crackers. Whole-wheat flour tortillas. Vegetables Fresh or frozen vegetables (raw, steamed, roasted, or grilled). Low-sodium or reduced-sodium tomato and vegetable juice. Low-sodium or reduced-sodium tomato sauce and tomato paste. Low-sodium or reduced-sodium canned vegetables. Fruits All fresh, dried, or frozen fruit. Canned fruit in  natural juice (without added sugar). Meat and other protein foods Skinless chicken or [place]. Ground chicken or [place]. Pork with fat trimmed off. Fish and seafood. Egg whites. Dried beans, peas, or lentils. Unsalted nuts, nut butters, and seeds. Unsalted canned beans. Lean cuts of beef with fat trimmed off. Low-sodium, lean deli meat. Dairy Low-fat (1%) or fat-free (skim) milk. Fat-free, low-fat, or reduced-fat cheeses. Nonfat, low-sodium ricotta or cottage cheese. Low-fat or nonfat yogurt. Low-fat, low-sodium cheese. Fats and oils Soft margarine without trans fats. Vegetable oil. Low-fat, reduced-fat, or light mayonnaise and salad dressings (reduced-sodium). Canola, safflower, olive, soybean, and sunflower oils. Avocado. Seasoning and other foods Herbs. Spices. Seasoning mixes without salt. Unsalted popcorn and pretzels. Fat-free sweets. What foods are not recommended? The items listed may not be a complete list. Talk with your dietitian about what dietary choices are best for you. Grains Baked goods made with fat, such as croissants, muffins, or some breads. Dry pasta or rice meal packs. Vegetables Creamed or fried vegetables. Vegetables in a cheese sauce. Regular canned vegetables (not low-sodium or reduced-sodium). Regular canned tomato sauce and paste (not low-sodium or reduced-sodium). Regular tomato and vegetable juice (not low-sodium or reduced-sodium). Angie Fava. Olives. Fruits Canned fruit in a light or heavy syrup. Fried fruit. Fruit in cream or butter sauce. Meat and other protein foods Fatty cuts of meat. Ribs. Fried meat. Berniece Salines. Sausage. Bologna and other processed lunch meats. Salami. Fatback. Hotdogs. Bratwurst. Salted nuts and seeds. Canned beans with added salt. Canned or smoked fish. Whole eggs or egg yolks. Chicken or [place] with skin. Dairy Whole or 2% milk, cream, and half-and-half. Whole or full-fat cream cheese. Whole-fat or sweetened yogurt. Full-fat cheese. Nondairy  creamers. Whipped toppings. Processed cheese and cheese spreads. Fats and oils Butter. Stick margarine. Lard. Shortening. Ghee. Bacon fat. Tropical oils, such as coconut, palm kernel, or palm oil. Seasoning and other foods Salted popcorn and pretzels. Onion salt, garlic salt, seasoned salt, table salt, and sea salt. Worcestershire sauce. Tartar sauce. Barbecue sauce. Teriyaki sauce. Soy sauce, including reduced-sodium. Steak sauce. Canned and packaged gravies. Fish sauce. Oyster sauce. Cocktail sauce. Horseradish that you find on the shelf. Ketchup. Mustard. Meat flavorings and tenderizers. Bouillon cubes. Hot sauce and Tabasco sauce. Premade or packaged marinades. Premade or packaged taco seasonings. Relishes. Regular salad dressings. Where to find more information:  National Heart, Lung, and [place]: https://wilson-eaton.com/  American Heart Association: www.heart.org Summary  The DASH eating plan is a healthy eating plan that has been shown to reduce high blood pressure (hypertension). It may also reduce your risk for type 2 diabetes, heart disease, and stroke.  With the DASH eating plan, you should limit salt (sodium) intake to 2,300 mg a day. If you have hypertension, you may need to reduce your sodium intake to 1,500 mg a day.  When on the DASH eating plan, aim to eat more fresh fruits and vegetables, whole grains, lean proteins, low-fat dairy, and heart-healthy fats.  Work with your health care provider or diet and nutrition specialist (dietitian) to adjust your eating  plan to your individual calorie needs. This information is not intended to replace advice given to you by your health care provider. Make sure you discuss any questions you have with your health care provider. Document Revised: 02/10/2017 Document Reviewed: 02/22/2016 Elsevier Patient Education  2020 [employer].

## 2020-01-23 NOTE — Assessment & Plan Note (Signed)
meds refilled and uds and contract updated

## 2020-01-23 NOTE — Assessment & Plan Note (Signed)
Encouraged DASH or MIND diet, decrease po intake and increase exercise as tolerated. Needs 7-8 hours of sleep nightly. Avoid trans fats, eat small, frequent meals every 4-5 hours with lean proteins, complex carbs and healthy fats. Minimize simple carbs, GMO foods. Will try a Saxenda course. Start at 0.6 mg [place] daily titrating up by 0.6 weekly to a max of 3 mg daily

## 2020-01-24 LAB — RHEUMATOID FACTOR: Rheumatoid fact SerPl-aCnc: <14 IU/mL (ref <14)

## 2020-01-24 LAB — ANA: Anti Nuclear Antibody (ANA): NEGATIVE

## 2020-01-25 LAB — DRUG MONITORING, PANEL 8 WITH CONFIRMATION, URINE
6 Acetylmorphine: NEGATIVE ng/mL (ref <10)
Alcohol Metabolites: POSITIVE ng/mL — AB
Amphetamine: 3862 ng/mL — ABNORMAL HIGH (ref <250)
Amphetamines: POSITIVE ng/mL — AB (ref <500)
Benzodiazepines: NEGATIVE ng/mL (ref <100)
Buprenorphine, Urine: NEGATIVE ng/mL (ref <5)
Cocaine Metabolite: NEGATIVE ng/mL (ref <150)
Creatinine: 62.6 mg/dL
Ethyl Glucuronide (ETG): 1598 ng/mL — ABNORMAL HIGH (ref <500)
Ethyl Sulfate (ETS): 477 ng/mL — ABNORMAL HIGH (ref <100)
MDMA: NEGATIVE ng/mL (ref <500)
Marijuana Metabolite: NEGATIVE ng/mL (ref <20)
Methamphetamine: NEGATIVE ng/mL (ref <250)
Opiates: NEGATIVE ng/mL (ref <100)
Oxidant: NEGATIVE ug/mL
Oxycodone: NEGATIVE ng/mL (ref <100)
pH: 6.2 (ref 4.5–9.0)

## 2020-01-25 LAB — DM TEMPLATE

## 2020-01-27 ENCOUNTER — Encounter: Payer: Self-pay | Admitting: Family Medicine

## 2020-01-28 ENCOUNTER — Telehealth: Payer: Self-pay

## 2020-01-28 NOTE — Telephone Encounter (Signed)
Attempted to call, phone not working. mychart message sent to pt to inform of results

## 2020-01-28 NOTE — Telephone Encounter (Signed)
-----   Message from Mosie Lukes, MD sent at 01/26/2020  8:38 PM EST ----- Positive for alcohol. So repeat in 3 months. Decrease Alcohol intake

## 2020-02-21 ENCOUNTER — Encounter: Payer: Self-pay | Admitting: Family Medicine

## 2020-02-21 DIAGNOSIS — R739 Hyperglycemia, unspecified: Secondary | ICD-10-CM

## 2020-02-21 DIAGNOSIS — Z0000 Encounter for general adult medical examination without abnormal findings: Secondary | ICD-10-CM

## 2020-02-21 DIAGNOSIS — E785 Hyperlipidemia, unspecified: Secondary | ICD-10-CM

## 2020-02-21 MED ORDER — BUPROPION HCL ER (XL) 300 MG PO TB24: 300.0000 mg | ORAL_TABLET | Freq: Every day | ORAL | 1 refills | Status: DC

## 2020-04-10 ENCOUNTER — Other Ambulatory Visit: Payer: Self-pay | Admitting: Family Medicine

## 2020-04-10 ENCOUNTER — Encounter: Payer: Self-pay | Admitting: Family Medicine

## 2020-04-10 MED ORDER — SAXENDA 18 MG/3ML ~~LOC~~ SOPN: 2.4000 mg | PEN_INJECTOR | Freq: Every day | SUBCUTANEOUS | 3 refills | Status: DC

## 2020-04-10 MED FILL — SAXENDA 18 MG/3 ML PEN: 18 | 30 days supply | Qty: 15 | Fill #0

## 2020-04-13 ENCOUNTER — Other Ambulatory Visit: Payer: Self-pay

## 2020-04-13 ENCOUNTER — Other Ambulatory Visit: Payer: Self-pay | Admitting: Cardiology

## 2020-04-13 MED ORDER — METOPROLOL TARTRATE 50 MG PO TABS: 50.0000 mg | ORAL_TABLET | Freq: Two times a day (BID) | ORAL | 0 refills | Status: DC

## 2020-04-14 MED ORDER — SAXENDA 18 MG/3ML ~~LOC~~ SOPN: 2.4000 mg | PEN_INJECTOR | Freq: Every day | SUBCUTANEOUS | 3 refills | Status: DC

## 2020-04-15 MED ORDER — METOPROLOL TARTRATE 50 MG PO TABS: 50.0000 mg | ORAL_TABLET | Freq: Two times a day (BID) | ORAL | 0 refills | Status: DC

## 2020-04-15 NOTE — Addendum Note (Signed)
Addended by: Gaetano Net on: 04/15/2020 07:52 AM   Modules accepted: Orders

## 2020-04-17 ENCOUNTER — Other Ambulatory Visit: Payer: Self-pay | Admitting: Family Medicine

## 2020-05-04 ENCOUNTER — Other Ambulatory Visit: Payer: Self-pay

## 2020-05-04 ENCOUNTER — Ambulatory Visit: Payer: PRIVATE HEALTH INSURANCE | Admitting: Family Medicine

## 2020-05-04 ENCOUNTER — Encounter: Payer: Self-pay | Admitting: Family Medicine

## 2020-05-04 VITALS — BP 112/78 | HR 82 | Temp 98.1°F | Resp 16 | Wt 193.0 lb

## 2020-05-04 DIAGNOSIS — F988 Other specified behavioral and emotional disorders with onset usually occurring in childhood and adolescence: Secondary | ICD-10-CM

## 2020-05-04 DIAGNOSIS — E785 Hyperlipidemia, unspecified: Secondary | ICD-10-CM

## 2020-05-04 DIAGNOSIS — E559 Vitamin D deficiency, unspecified: Secondary | ICD-10-CM | POA: Diagnosis not present

## 2020-05-04 DIAGNOSIS — R7303 Prediabetes: Secondary | ICD-10-CM | POA: Diagnosis not present

## 2020-05-04 DIAGNOSIS — R7989 Other specified abnormal findings of blood chemistry: Secondary | ICD-10-CM

## 2020-05-04 DIAGNOSIS — E7849 Other hyperlipidemia: Secondary | ICD-10-CM

## 2020-05-04 DIAGNOSIS — K219 Gastro-esophageal reflux disease without esophagitis: Secondary | ICD-10-CM | POA: Diagnosis not present

## 2020-05-04 DIAGNOSIS — E669 Obesity, unspecified: Secondary | ICD-10-CM

## 2020-05-04 DIAGNOSIS — Z952 Presence of prosthetic heart valve: Secondary | ICD-10-CM

## 2020-05-04 DIAGNOSIS — F32A Depression, unspecified: Secondary | ICD-10-CM

## 2020-05-04 DIAGNOSIS — F419 Anxiety disorder, unspecified: Secondary | ICD-10-CM

## 2020-05-04 LAB — COMPREHENSIVE METABOLIC PANEL
ALT: 23 U/L (ref 0–35)
AST: 23 U/L (ref 0–37)
Albumin: 4.4 g/dL (ref 3.5–5.2)
Alkaline Phosphatase: 52 U/L (ref 39–117)
BUN: 15 mg/dL (ref 6–23)
CO2: 30 mEq/L (ref 19–32)
Calcium: 10 mg/dL (ref 8.4–10.5)
Chloride: 100 mEq/L (ref 96–112)
Creatinine, Ser: 0.81 mg/dL (ref 0.40–1.20)
GFR: 84.51 mL/min (ref >60.00)
Glucose, Bld: 84 mg/dL (ref 70–99)
Potassium: 4.4 mEq/L (ref 3.5–5.1)
Sodium: 136 mEq/L (ref 135–145)
Total Bilirubin: 0.8 mg/dL (ref 0.2–1.2)
Total Protein: 7.3 g/dL (ref 6.0–8.3)

## 2020-05-04 LAB — CBC
HCT: 38.2 % (ref 36.0–46.0)
Hemoglobin: 13.2 g/dL (ref 12.0–15.0)
MCHC: 34.5 g/dL (ref 30.0–36.0)
MCV: 94 fl (ref 78.0–100.0)
Platelets: 270 10*3/uL (ref 150.0–400.0)
RBC: 4.06 Mil/uL (ref 3.87–5.11)
RDW: 12.9 % (ref 11.5–15.5)
WBC: 5.9 10*3/uL (ref 4.0–10.5)

## 2020-05-04 LAB — VITAMIN D 25 HYDROXY (VIT D DEFICIENCY, FRACTURES): VITD: 37.68 ng/mL (ref 30.00–100.00)

## 2020-05-04 LAB — TSH: TSH: 3.38 u[IU]/mL (ref 0.35–4.50)

## 2020-05-04 LAB — VITAMIN B12: Vitamin B-12: 609 pg/mL (ref 211–911)

## 2020-05-04 LAB — LIPID PANEL
Cholesterol: 183 mg/dL (ref 0–200)
HDL: 43.7 mg/dL (ref >39.00)
LDL Cholesterol: 105 mg/dL — ABNORMAL HIGH (ref 0–99)
NonHDL: 138.95
Total CHOL/HDL Ratio: 4
Triglycerides: 171 mg/dL — ABNORMAL HIGH (ref 0.0–149.0)
VLDL: 34.2 mg/dL (ref 0.0–40.0)

## 2020-05-04 LAB — HEMOGLOBIN A1C: Hgb A1c MFr Bld: 5.3 % (ref 4.6–6.5)

## 2020-05-04 NOTE — Assessment & Plan Note (Signed)
Repeat labs today

## 2020-05-04 NOTE — Assessment & Plan Note (Signed)
She is tolerating the Saxenda and has managed to loose a good deal of weight. It does make her feel full quicker and suppress her appetite will continue to prescribe and check labs today

## 2020-05-04 NOTE — Assessment & Plan Note (Signed)
Tolerating current meds, no changes 

## 2020-05-04 NOTE — Assessment & Plan Note (Signed)
Check level today 

## 2020-05-04 NOTE — Assessment & Plan Note (Signed)
Tolerating Coumadin 
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
## 2020-05-04 NOTE — Assessment & Plan Note (Signed)
Supplement and monitor 

## 2020-05-04 NOTE — Assessment & Plan Note (Signed)
She is managing her job at [employer] well but is very sad as her boyfriend has MM with bone mets and a great deal of pain. She does not feel she needs her meds changed at this time

## 2020-05-04 NOTE — Patient Instructions (Signed)
Hypothyroidism  Hypothyroidism is when the thyroid gland does not make enough of certain hormones (it is underactive). The thyroid gland is a small gland located in the lower front part of the neck, just in front of the windpipe (trachea). This gland makes hormones that help control how the body uses food for energy (metabolism) as well as how the heart and brain function. These hormones also play a role in keeping your bones strong. When the thyroid is underactive, it produces too little of the hormones thyroxine (T4) and triiodothyronine (T3). What are the causes? This condition may be caused by:  Hashimoto's disease. This is a disease in which the body's disease-fighting system (immune system) attacks the thyroid gland. This is the most common cause.  Viral infections.  Pregnancy.  Certain medicines.  Birth defects.  Past radiation treatments to the head or neck for cancer.  Past treatment with radioactive iodine.  Past exposure to radiation in the environment.  Past surgical removal of part or all of the thyroid.  Problems with a gland in the center of the brain (pituitary gland).  Lack of enough iodine in the diet. What increases the risk? You are more likely to develop this condition if:  You are female.  You have a family history of thyroid conditions.  You use a medicine called lithium.  You take medicines that affect the immune system (immunosuppressants). What are the signs or symptoms? Symptoms of this condition include:  Feeling as though you have no energy (lethargy).  Not being able to tolerate cold.  Weight gain that is not explained by a change in diet or exercise habits.  Lack of appetite.  Dry skin.  Coarse hair.  Menstrual irregularity.  Slowing of thought processes.  Constipation.  Sadness or depression. How is this diagnosed? This condition may be diagnosed based on:  Your symptoms, your medical history, and a physical exam.  Blood  tests. You may also have imaging tests, such as an ultrasound or MRI. How is this treated? This condition is treated with medicine that replaces the thyroid hormones that your body does not make. After you begin treatment, it may take several weeks for symptoms to go away. Follow these instructions at home:  Take over-the-counter and prescription medicines only as told by your health care provider.  If you start taking any new medicines, tell your health care provider.  Keep all follow-up visits as told by your health care provider. This is important. ? As your condition improves, your dosage of thyroid hormone medicine may change. ? You will need to have blood tests regularly so that your health care provider can monitor your condition. Contact a health care provider if:  Your symptoms do not get better with treatment.  You are taking thyroid hormone replacement medicine and you: ? Sweat a lot. ? Have tremors. ? Feel anxious. ? Lose weight rapidly. ? Cannot tolerate heat. ? Have emotional swings. ? Have diarrhea. ? Feel weak. Get help right away if you have:  Chest pain.  An irregular heartbeat.  A rapid heartbeat.  Difficulty breathing. Summary  Hypothyroidism is when the thyroid gland does not make enough of certain hormones (it is underactive).  When the thyroid is underactive, it produces too little of the hormones thyroxine (T4) and triiodothyronine (T3).  The most common cause is Hashimoto's disease, a disease in which the body's disease-fighting system (immune system) attacks the thyroid gland. The condition can also be caused by viral infections, medicine, pregnancy, or   past radiation treatment to the head or neck.  Symptoms may include weight gain, dry skin, constipation, feeling as though you do not have energy, and not being able to tolerate cold.  This condition is treated with medicine to replace the thyroid hormones that your body does not make. This  information is not intended to replace advice given to you by your health care provider. Make sure you discuss any questions you have with your health care provider. Document Revised: 11/29/2019 Document Reviewed: 11/14/2019 Elsevier Patient Education  2021 Elsevier Inc.  

## 2020-05-11 ENCOUNTER — Encounter: Payer: Self-pay | Admitting: Family Medicine

## 2020-05-11 MED ORDER — AMPHETAMINE-DEXTROAMPHETAMINE 20 MG PO TABS: 20.0000 mg | ORAL_TABLET | Freq: Three times a day (TID) | ORAL | 0 refills | Status: DC

## 2020-05-11 NOTE — Telephone Encounter (Signed)
Requesting: Adderall 20mg  Contract: 01/23/2020 UDS: 01/23/2020 Last Visit: None Next Visit: 05/04/2020 Last Refill: 01/23/2020 #90 and 0RF (x3)  Please Advise

## 2020-07-28 ENCOUNTER — Encounter: Payer: Self-pay | Admitting: Family Medicine

## 2020-07-28 MED ORDER — AMPHETAMINE-DEXTROAMPHETAMINE 20 MG PO TABS: 20.0000 mg | ORAL_TABLET | Freq: Three times a day (TID) | ORAL | 0 refills | Status: DC

## 2020-07-28 MED ORDER — OMEPRAZOLE 40 MG PO CPDR: DELAYED_RELEASE_CAPSULE | ORAL | 1 refills | Status: DC

## 2020-07-28 NOTE — Telephone Encounter (Signed)
Requesting: adderrall  Contract:02/01/2020 UDS:01/23/2020 Last Visit:05/04/2020 Next Visit:n/a Last Refill:05/11/2020  Please Advise

## 2020-08-12 ENCOUNTER — Encounter: Payer: Self-pay | Admitting: Family Medicine

## 2020-08-14 MED ORDER — ATORVASTATIN CALCIUM 40 MG PO TABS: 40.0000 mg | ORAL_TABLET | Freq: Every day | ORAL | 1 refills | Status: DC

## 2020-09-03 ENCOUNTER — Other Ambulatory Visit: Payer: Self-pay | Admitting: Family Medicine

## 2020-09-03 DIAGNOSIS — Z0000 Encounter for general adult medical examination without abnormal findings: Secondary | ICD-10-CM

## 2020-09-03 DIAGNOSIS — R739 Hyperglycemia, unspecified: Secondary | ICD-10-CM

## 2020-09-03 DIAGNOSIS — E785 Hyperlipidemia, unspecified: Secondary | ICD-10-CM

## 2020-09-03 NOTE — Telephone Encounter (Signed)
Requesting: Adderall 20mg  Contract: 01/23/2020 UDS: 01/23/2020 Last Visit: 05/04/2020 Next Visit: None Last Refill: 07/28/2020 #90 and 0RF Pt sig: 1 tab tid   Please Advise

## 2020-10-06 ENCOUNTER — Ambulatory Visit: Payer: No Typology Code available for payment source | Admitting: Family Medicine

## 2020-10-06 ENCOUNTER — Encounter: Payer: Self-pay | Admitting: Family Medicine

## 2020-10-06 ENCOUNTER — Ambulatory Visit (HOSPITAL_BASED_OUTPATIENT_CLINIC_OR_DEPARTMENT_OTHER)
Admission: RE | Admit: 2020-10-06 | Discharge: 2020-10-06 | Disposition: A | Discharge status: Home / Self Care | Payer: No Typology Code available for payment source | Source: Ambulatory Visit | Attending: Family Medicine | Admitting: Family Medicine

## 2020-10-06 ENCOUNTER — Other Ambulatory Visit: Payer: Self-pay

## 2020-10-06 VITALS — BP 142/59 | HR 71 | Temp 97.9°F | Resp 16 | Wt 199.6 lb

## 2020-10-06 DIAGNOSIS — I351 Nonrheumatic aortic (valve) insufficiency: Secondary | ICD-10-CM | POA: Diagnosis not present

## 2020-10-06 DIAGNOSIS — E7849 Other hyperlipidemia: Secondary | ICD-10-CM

## 2020-10-06 DIAGNOSIS — K219 Gastro-esophageal reflux disease without esophagitis: Secondary | ICD-10-CM

## 2020-10-06 DIAGNOSIS — M542 Cervicalgia: Secondary | ICD-10-CM

## 2020-10-06 DIAGNOSIS — R739 Hyperglycemia, unspecified: Secondary | ICD-10-CM

## 2020-10-06 DIAGNOSIS — R7989 Other specified abnormal findings of blood chemistry: Secondary | ICD-10-CM | POA: Diagnosis not present

## 2020-10-06 DIAGNOSIS — E785 Hyperlipidemia, unspecified: Secondary | ICD-10-CM | POA: Diagnosis not present

## 2020-10-06 DIAGNOSIS — E559 Vitamin D deficiency, unspecified: Secondary | ICD-10-CM | POA: Diagnosis not present

## 2020-10-06 DIAGNOSIS — Z952 Presence of prosthetic heart valve: Secondary | ICD-10-CM | POA: Diagnosis not present

## 2020-10-06 DIAGNOSIS — Z0000 Encounter for general adult medical examination without abnormal findings: Secondary | ICD-10-CM

## 2020-10-06 DIAGNOSIS — L989 Disorder of the skin and subcutaneous tissue, unspecified: Secondary | ICD-10-CM

## 2020-10-06 DIAGNOSIS — R7303 Prediabetes: Secondary | ICD-10-CM

## 2020-10-06 DIAGNOSIS — F988 Other specified behavioral and emotional disorders with onset usually occurring in childhood and adolescence: Secondary | ICD-10-CM

## 2020-10-06 LAB — CBC
HCT: 39.4 % (ref 36.0–46.0)
Hemoglobin: 13.1 g/dL (ref 12.0–15.0)
MCHC: 33.4 g/dL (ref 30.0–36.0)
MCV: 95.4 fl (ref 78.0–100.0)
Platelets: 253 10*3/uL (ref 150.0–400.0)
RBC: 4.13 Mil/uL (ref 3.87–5.11)
RDW: 12.8 % (ref 11.5–15.5)
WBC: 6.2 10*3/uL (ref 4.0–10.5)

## 2020-10-06 LAB — LIPID PANEL
Cholesterol: 205 mg/dL — ABNORMAL HIGH (ref 0–200)
HDL: 41.4 mg/dL (ref >39.00)
NonHDL: 163.24
Total CHOL/HDL Ratio: 5
Triglycerides: 335 mg/dL — ABNORMAL HIGH (ref 0.0–149.0)
VLDL: 67 mg/dL — ABNORMAL HIGH (ref 0.0–40.0)

## 2020-10-06 LAB — COMPREHENSIVE METABOLIC PANEL
ALT: 28 U/L (ref 0–35)
AST: 24 U/L (ref 0–37)
Albumin: 4.5 g/dL (ref 3.5–5.2)
Alkaline Phosphatase: 49 U/L (ref 39–117)
BUN: 16 mg/dL (ref 6–23)
CO2: 29 mEq/L (ref 19–32)
Calcium: 9.7 mg/dL (ref 8.4–10.5)
Chloride: 101 mEq/L (ref 96–112)
Creatinine, Ser: 0.76 mg/dL (ref 0.40–1.20)
GFR: 90.95 mL/min (ref >60.00)
Glucose, Bld: 85 mg/dL (ref 70–99)
Potassium: 4.3 mEq/L (ref 3.5–5.1)
Sodium: 138 mEq/L (ref 135–145)
Total Bilirubin: 0.8 mg/dL (ref 0.2–1.2)
Total Protein: 7.4 g/dL (ref 6.0–8.3)

## 2020-10-06 LAB — HEMOGLOBIN A1C: Hgb A1c MFr Bld: 5.5 % (ref 4.6–6.5)

## 2020-10-06 LAB — LDL CHOLESTEROL, DIRECT: Direct LDL: 121 mg/dL

## 2020-10-06 LAB — VITAMIN B12: Vitamin B-12: 353 pg/mL (ref 211–911)

## 2020-10-06 LAB — VITAMIN D 25 HYDROXY (VIT D DEFICIENCY, FRACTURES): VITD: 41.05 ng/mL (ref 30.00–100.00)

## 2020-10-06 LAB — TSH: TSH: 3.31 u[IU]/mL (ref 0.35–5.50)

## 2020-10-06 MED ORDER — BUPROPION HCL ER (XL) 300 MG PO TB24: 300.0000 mg | ORAL_TABLET | Freq: Every day | ORAL | 1 refills | Status: DC

## 2020-10-06 MED ORDER — TIZANIDINE HCL 2 MG PO TABS: 1.0000 mg | ORAL_TABLET | Freq: Three times a day (TID) | ORAL | 2 refills | Status: DC | PRN

## 2020-10-06 MED ORDER — AMPHETAMINE-DEXTROAMPHETAMINE 20 MG PO TABS: 20.0000 mg | ORAL_TABLET | Freq: Three times a day (TID) | ORAL | 0 refills | Status: DC

## 2020-10-06 NOTE — Patient Instructions (Signed)
  Paxlovid or Molnupiravir are the new COVID medications we can give you if you get COVID so make sure you test if you have symptoms because we have to treat by day 5 of symptoms for it to be effective. If you are positive let us know so we can treat. If a home test is negative and your symptoms are persistent get a PCR test. Can check testing locations at [email or web address] If you are positive we will make an appointment with us and we will send in Paxlovid if you would like it. Check with your pharmacy before we meet to confirm they have it in stock, if they do not then we can get the prescription at the [facility]  
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
## 2020-10-11 DIAGNOSIS — L989 Disorder of the skin and subcutaneous tissue, unspecified: Secondary | ICD-10-CM | POA: Insufficient documentation

## 2020-10-11 NOTE — Assessment & Plan Note (Signed)
Referred to dermatology for further consideration.  

## 2020-10-11 NOTE — Assessment & Plan Note (Signed)
Avoid offending foods, start probiotics. Do not eat large meals in late evening and consider raising head of bed.  

## 2020-10-11 NOTE — Assessment & Plan Note (Signed)
S/p valvular replacement and following with cardiology

## 2020-10-11 NOTE — Assessment & Plan Note (Signed)
Causing frequent pain and headache at the occiput. 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 She would benefit from an adjustable desk at work so she can alter her position to help manage her pain. Encouraged moist heat and gentle stretching as tolerated. May try NSAIDs and prescription meds as directed and report if symptoms worsen or seek immediate care. Given a prescription for Tizanidine 2 mg tabs to take 1-4 mg as needed.

## 2020-10-11 NOTE — Assessment & Plan Note (Signed)
hgba1c acceptable, minimize simple carbs. Increase exercise as tolerated. Continue current meds 

## 2020-10-11 NOTE — Assessment & Plan Note (Signed)
Tolerating statin, encouraged heart healthy diet, avoid trans fats, minimize simple carbs and saturated fats. Increase exercise as tolerated 

## 2020-10-11 NOTE — Assessment & Plan Note (Signed)
Encourage heart healthy diet such as MIND or DASH diet, increase exercise, avoid trans fats, simple carbohydrates and processed foods, consider a krill or fish or flaxseed oil cap daily. Tolerating Atorvastatin 

## 2020-10-11 NOTE — Assessment & Plan Note (Signed)
Doing well on current meds. No changes, continue amphetamine-dextromethorphin 20 mg tid

## 2020-10-11 NOTE — Assessment & Plan Note (Signed)
Supplement and monitor 

## 2020-10-12 ENCOUNTER — Other Ambulatory Visit: Payer: Self-pay | Admitting: *Deleted

## 2020-10-12 MED ORDER — ATORVASTATIN CALCIUM 20 MG PO TABS: 60.0000 mg | ORAL_TABLET | Freq: Every day | ORAL | 1 refills | Status: DC

## 2021-01-13 ENCOUNTER — Encounter: Payer: Self-pay | Admitting: Family Medicine

## 2021-01-20 ENCOUNTER — Encounter: Payer: Self-pay | Admitting: Family Medicine

## 2021-01-20 MED ORDER — OMEPRAZOLE 40 MG PO CPDR: DELAYED_RELEASE_CAPSULE | ORAL | 1 refills | Status: DC

## 2021-01-21 LAB — HM MAMMOGRAPHY

## 2021-02-08 ENCOUNTER — Encounter: Payer: Self-pay | Admitting: *Deleted

## 2021-02-08 LAB — HM MAMMOGRAPHY

## 2021-03-01 ENCOUNTER — Encounter: Payer: Self-pay | Admitting: Family Medicine

## 2021-03-01 ENCOUNTER — Other Ambulatory Visit: Payer: Self-pay | Admitting: Family Medicine

## 2021-03-01 MED ORDER — AMPHETAMINE-DEXTROAMPHETAMINE 20 MG PO TABS: 20.0000 mg | ORAL_TABLET | Freq: Three times a day (TID) | ORAL | 0 refills | Status: DC

## 2021-03-01 NOTE — Telephone Encounter (Signed)
Requesting: Adderall 20mg   Contract: 01/23/2020 UDS: 01/23/2020 Last Visit: 10/06/2020 Next Visit: None Last Refill: 10/06/2020 #90 and 0RF (x3)  Please Advise

## 2021-04-23 ENCOUNTER — Other Ambulatory Visit: Payer: Self-pay | Admitting: Family Medicine

## 2021-04-23 DIAGNOSIS — Z0000 Encounter for general adult medical examination without abnormal findings: Secondary | ICD-10-CM

## 2021-04-23 DIAGNOSIS — E785 Hyperlipidemia, unspecified: Secondary | ICD-10-CM

## 2021-04-23 DIAGNOSIS — R739 Hyperglycemia, unspecified: Secondary | ICD-10-CM

## 2021-05-17 ENCOUNTER — Other Ambulatory Visit: Payer: Self-pay | Admitting: Family Medicine

## 2021-05-17 NOTE — Telephone Encounter (Signed)
[place] Pt  ?

## 2021-05-18 ENCOUNTER — Telehealth: Payer: Self-pay | Admitting: Family Medicine

## 2021-05-18 NOTE — Telephone Encounter (Signed)
Adderall is on back order and pharmacy is only able to fill 30 days. Pt is okay with this, but they wanted to make pcp aware because the remaining rx will be voided and the next refill may look like she is trying to get a refill too early.  ?

## 2021-05-24 LAB — HM PAP SMEAR

## 2021-06-08 ENCOUNTER — Ambulatory Visit (INDEPENDENT_AMBULATORY_CARE_PROVIDER_SITE_OTHER): Payer: PRIVATE HEALTH INSURANCE | Admitting: Medical

## 2021-06-08 ENCOUNTER — Telehealth: Payer: Self-pay

## 2021-06-08 ENCOUNTER — Other Ambulatory Visit: Payer: Self-pay

## 2021-06-08 ENCOUNTER — Ambulatory Visit (HOSPITAL_BASED_OUTPATIENT_CLINIC_OR_DEPARTMENT_OTHER)
Admission: RE | Admit: 2021-06-08 | Discharge: 2021-06-08 | Disposition: A | Discharge status: Home / Self Care | Payer: PRIVATE HEALTH INSURANCE | Source: Ambulatory Visit | Attending: Medical | Admitting: Medical

## 2021-06-08 VITALS — BP 148/80 | HR 74 | Temp 98.0°F | Resp 18 | Ht 64.0 in | Wt 190.6 lb

## 2021-06-08 DIAGNOSIS — R790 Abnormal level of blood mineral: Secondary | ICD-10-CM | POA: Diagnosis not present

## 2021-06-08 DIAGNOSIS — I10 Essential (primary) hypertension: Secondary | ICD-10-CM

## 2021-06-08 DIAGNOSIS — M25551 Pain in right hip: Secondary | ICD-10-CM | POA: Diagnosis not present

## 2021-06-08 DIAGNOSIS — M2550 Pain in unspecified joint: Secondary | ICD-10-CM | POA: Diagnosis not present

## 2021-06-08 DIAGNOSIS — R739 Hyperglycemia, unspecified: Secondary | ICD-10-CM

## 2021-06-08 DIAGNOSIS — Z1283 Encounter for screening for malignant neoplasm of skin: Secondary | ICD-10-CM | POA: Diagnosis not present

## 2021-06-08 DIAGNOSIS — Z0000 Encounter for general adult medical examination without abnormal findings: Secondary | ICD-10-CM | POA: Diagnosis not present

## 2021-06-08 DIAGNOSIS — R21 Rash and other nonspecific skin eruption: Secondary | ICD-10-CM | POA: Diagnosis not present

## 2021-06-08 DIAGNOSIS — F988 Other specified behavioral and emotional disorders with onset usually occurring in childhood and adolescence: Secondary | ICD-10-CM

## 2021-06-08 DIAGNOSIS — R7689 Other specified abnormal immunological findings in serum: Secondary | ICD-10-CM

## 2021-06-08 DIAGNOSIS — R768 Other specified abnormal immunological findings in serum: Secondary | ICD-10-CM

## 2021-06-08 DIAGNOSIS — E669 Obesity, unspecified: Secondary | ICD-10-CM

## 2021-06-08 LAB — COMPREHENSIVE METABOLIC PANEL
ALT: 21 U/L (ref 0–35)
AST: 22 U/L (ref 0–37)
Albumin: 4.7 g/dL (ref 3.5–5.2)
Alkaline Phosphatase: 36 U/L — ABNORMAL LOW (ref 39–117)
BUN: 19 mg/dL (ref 6–23)
CO2: 29 mEq/L (ref 19–32)
Calcium: 10 mg/dL (ref 8.4–10.5)
Chloride: 101 mEq/L (ref 96–112)
Creatinine, Ser: 0.85 mg/dL (ref 0.40–1.20)
GFR: 79.15 mL/min (ref >60.00)
Glucose, Bld: 107 mg/dL — ABNORMAL HIGH (ref 70–99)
Potassium: 4.4 mEq/L (ref 3.5–5.1)
Sodium: 137 mEq/L (ref 135–145)
Total Bilirubin: 0.7 mg/dL (ref 0.2–1.2)
Total Protein: 7.4 g/dL (ref 6.0–8.3)

## 2021-06-08 LAB — CBC WITH DIFFERENTIAL/PLATELET
Basophils Absolute: 0 10*3/uL (ref 0.0–0.1)
Basophils Relative: 1.1 % (ref 0.0–3.0)
Eosinophils Absolute: 0.2 10*3/uL (ref 0.0–0.7)
Eosinophils Relative: 3.5 % (ref 0.0–5.0)
HCT: 40.1 % (ref 36.0–46.0)
Hemoglobin: 13.8 g/dL (ref 12.0–15.0)
Lymphocytes Relative: 31.4 % (ref 12.0–46.0)
Lymphs Abs: 1.4 10*3/uL (ref 0.7–4.0)
MCHC: 34.4 g/dL (ref 30.0–36.0)
MCV: 95.4 fl (ref 78.0–100.0)
Monocytes Absolute: 0.4 10*3/uL (ref 0.1–1.0)
Monocytes Relative: 8.6 % (ref 3.0–12.0)
Neutro Abs: 2.4 10*3/uL (ref 1.4–7.7)
Neutrophils Relative %: 55.4 % (ref 43.0–77.0)
Platelets: 222 10*3/uL (ref 150.0–400.0)
RBC: 4.2 Mil/uL (ref 3.87–5.11)
RDW: 12.7 % (ref 11.5–15.5)
WBC: 4.3 10*3/uL (ref 4.0–10.5)

## 2021-06-08 LAB — C-REACTIVE PROTEIN: CRP: <1 mg/dL (ref 0.5–20.0)

## 2021-06-08 LAB — SEDIMENTATION RATE: Sed Rate: 8 mm/hr (ref 0–30)

## 2021-06-08 LAB — LIPID PANEL
Cholesterol: 207 mg/dL — ABNORMAL HIGH (ref 0–200)
HDL: 47.9 mg/dL (ref >39.00)
LDL Cholesterol: 127 mg/dL — ABNORMAL HIGH (ref 0–99)
NonHDL: 159.48
Total CHOL/HDL Ratio: 4
Triglycerides: 162 mg/dL — ABNORMAL HIGH (ref 0.0–149.0)
VLDL: 32.4 mg/dL (ref 0.0–40.0)

## 2021-06-08 LAB — HEMOGLOBIN A1C: Hgb A1c MFr Bld: 5.5 % (ref 4.6–6.5)

## 2021-06-08 LAB — MAGNESIUM: Magnesium: 1.8 mg/dL (ref 1.5–2.5)

## 2021-06-08 MED ORDER — METHYLPREDNISOLONE 4 MG PO TABS
ORAL_TABLET | ORAL | 0 refills | Status: DC
Start: 2021-06-08 — End: 2022-06-29

## 2021-06-08 MED ORDER — SAXENDA 18 MG/3ML ~~LOC~~ SOPN: PEN_INJECTOR | SUBCUTANEOUS | 1 refills | Status: DC

## 2021-06-08 MED ORDER — METOPROLOL TARTRATE 50 MG PO TABS: 50.0000 mg | ORAL_TABLET | Freq: Two times a day (BID) | ORAL | 5 refills | Status: DC

## 2021-06-08 NOTE — Progress Notes (Signed)
? ?Subjective:  ? ? Patient ID: [name], female    DOB: Jun 14, 1970, 51 y.o.   MRN: [id number] ? ?HPI ? ?Her for wellness exam. Is fasting. ? ?Pt works [employer]. No exercise. States eating healthy most of time. Nonsmoker. Drinks glass wine 3-4 days a week. Coffee occasional.  ? ?Up to date on mammogram, pap and colonoscopy. ? ?Up to date on vaccines. ? ? ?Pt also has acute concerns.  ? ?Pt states has some skin concerns. Mentioned to pcp/Dr. Charlett Blake in past. She wants referral to derm. ? ?Pt having some joint pain in ankle and rt hip(notes fell back in oct). Rt hip pain is worse. Pain after sitting for long time or standing long time. No ankle pain today. But some intermittent.  ? ?Obese pt. Bmi 32. Pt wants refill of saxenda. She stopped saxenda when she went to [place] wt loss. ? ?ADD- rx refilled by Dr. Charlett Blake in past. Dr. Lorelei Pont appears to have filled last rx. In the controlled med web site see qut 90. Taking tid. ? ?Review of Systems  ?Constitutional:  Negative for chills, fatigue and fever.  ?Respiratory:  Negative for cough, chest tightness, shortness of breath and wheezing.   ?Cardiovascular:  Negative for chest pain and palpitations.  ?Gastrointestinal:  Negative for abdominal pain.  ?Genitourinary:  Negative for dyspareunia.  ?Musculoskeletal:  Positive for arthralgias.  ?Skin:  Negative for rash.  ?Neurological:  Negative for dizziness and headaches.  ?Hematological:  Negative for adenopathy. Does not bruise/bleed easily.  ?Psychiatric/Behavioral:  Negative for behavioral problems, confusion, decreased concentration and dysphoric mood.   ? ?   ?Objective:  ? Physical Exam ? ?General ?Mental Status- Alert. General Appearance- Not in acute distress.  ? ?Skin ?General: Color- Normal Color. Moisture- Normal Moisture. ? ?Neck ?Carotid Arteries- Normal color. Moisture- Normal Moisture. No carotid bruits. No JVD. ? ?Chest and Lung Exam ?Auscultation: ?Breath  Sounds:-Normal. ? ?Cardiovascular ?Auscultation:Rythm- Regular. ?Murmurs & Other Heart Sounds:Auscultation of the heart reveals- No Murmurs. ? ?Abdomen ?Inspection:-Inspeection Normal. ?Palpation/Percussion:Note:No mass. Palpation and Percussion of the abdomen reveal- Non Tender, Non Distended + BS, no rebound or guarding. ? ? ? ?Neurologic ?Cranial Nerve exam:- CN III-XII intact(No nystagmus), symmetric smile. ?Strength:- 5/5 equal and symmetric strength both upper and lower extremities.  ? ?Derm-pretibial regions have slight pinkish-red rash about 8 to 10 mm wide.  Also left side of face/cheek region slight pinkish-red rash about same in size.  Looks a little bit dry on inspection. ? ?Right hip-patient reports pain on standing. ? ?   ?Assessment & Plan:  ? ?For you wellness exam today I have ordered cbc, cmp and lipid panel. ? ? ?Vaccine up to date.  ? ?Up-to-date on Pap smear, colonoscopy and mammogram. ? ?Recommend exercise and healthy diet. ? ?We will let you know lab results as they come in. ? ?Follow up date appointment will be determined after lab review.   ? ?For rash in pretibial areas and face did place referral to dermatologist with [facility].  During the interim could apply hydrocortisone to pretibial regions.  Also moisturize those areas adequately.  For the face rash only recommend moisturizing presently pending referral to dermatologist. ? ?New right hip pain with some arthralgias in ankles and sometimes thumb pain.  X-ray of right hip today along with inflammatory lab studies.  After reviewing studies will decide on possible NSAID or other medication for pain/inflammation. ? ?Hypertension blood pressure borderline controlled today.  Refilled metoprolol.  Recommend checking blood pressure  occasionally at home to see if possibly lower by 10 points.  Oftentimes blood pressure in office can be higher. ? ?Elevated sugar in the past.  Will get A1c with labs today. ? ?Obesity with BMI slightly over 30.   Refilled Saxenda. ? ?ADD-would asked that you clarify with your pharmacist whether or not you have additional months refills.  If not let us know and would refill prescription when due. ? ?Low magnesium in the past.  We will get magnesium level today. ? ? ? ?[phone] charge as did address new rashes, right hip pain, arthralgias, hypertension, elevated blood sugar, obesity, ADD Rx question and low magnesium history.  In addition placed referral to dermatologist. ? ? ?

## 2021-06-08 NOTE — Addendum Note (Signed)
Addended by: Anabel Halon on: 06/08/2021 08:56 PM ? ? Modules accepted: Orders ? ?

## 2021-06-08 NOTE — Patient Instructions (Signed)
For you wellness exam today I have ordered cbc, cmp and lipid panel. ? ? ?Vaccine up to date.  ? ?Up-to-date on Pap smear, colonoscopy and mammogram. ? ?Recommend exercise and healthy diet. ? ?We will let you know lab results as they come in. ? ?Follow up date appointment will be determined after lab review.   ? ?For rash in pretibial areas and face did place referral to dermatologist with [facility].  During the interim could apply hydrocortisone to pretibial regions.  Also moisturize those areas adequately.  For the face rash only recommend moisturizing presently pending referral to dermatologist. ? ?New right hip pain with some arthralgias in ankles and sometimes thumb pain.  X-ray of right hip today along with inflammatory lab studies.  After reviewing studies will decide on possible NSAID or other medication for pain/inflammation. ? ?Hypertension blood pressure borderline controlled today.  Refilled metoprolol.  Recommend checking blood pressure occasionally at home to see if possibly lower by 10 points.  Oftentimes blood pressure in office can be higher. ? ?Elevated sugar in the past.  Will get A1c with labs today. ? ?Obesity with BMI slightly over 30.  Refilled Saxenda. ? ?ADD-would asked that you clarify with your pharmacist whether or not you have additional months refills.  If not let us know and would refill prescription when due. ? ?Low magnesium in the past.  We will get magnesium level today. ? ?Preventive Care 45-51 Years Old, Female ?Preventive care refers to lifestyle choices and visits with your health care provider that can promote health and wellness. Preventive care visits are also called wellness exams. ?What can I expect for my preventive care visit? ?Counseling ?Your health care provider may ask you questions about your: ?Medical history, including: ?Past medical problems. ?Family medical history. ?Pregnancy history. ?Current health, including: ?Menstrual cycle. ?Method of birth  control. ?Emotional well-being. ?Home life and relationship well-being. ?Sexual activity and sexual health. ?Lifestyle, including: ?Alcohol, nicotine or tobacco, and drug use. ?Access to firearms. ?Diet, exercise, and sleep habits. ?Work and work Statistician. ?Sunscreen use. ?Safety issues such as seatbelt and bike helmet use. ?Physical exam ?Your health care provider will check your: ?Height and weight. These may be used to calculate your BMI (body mass index). BMI is a measurement that tells if you are at a healthy weight. ?Waist circumference. This measures the distance around your waistline. This measurement also tells if you are at a healthy weight and may help predict your risk of certain diseases, such as type 2 diabetes and high blood pressure. ?Heart rate and blood pressure. ?Body temperature. ?Skin for abnormal spots. ?What immunizations do I need? ?Vaccines are usually given at various ages, according to a schedule. Your health care provider will recommend vaccines for you based on your age, medical history, and lifestyle or other factors, such as travel or where you work. ?What tests do I need? ?Screening ?Your health care provider may recommend screening tests for certain conditions. This may include: ?Lipid and cholesterol levels. ?Diabetes screening. This is done by checking your blood sugar (glucose) after you have not eaten for a while (fasting). ?Pelvic exam and Pap test. ?Hepatitis B test. ?Hepatitis C test. ?HIV (human immunodeficiency virus) test. ?STI (sexually transmitted infection) testing, if you are at risk. ?Lung cancer screening. ?Colorectal cancer screening. ?Mammogram. Talk with your health care provider about when you should start having regular mammograms. This may depend on whether you have a family history of breast cancer. ?BRCA-related cancer screening. This may be  done if you have a family history of breast, ovarian, tubal, or peritoneal cancers. ?Bone density scan. This is done  to screen for osteoporosis. ?Talk with your health care provider about your test results, treatment options, and if necessary, the need for more tests. ?Follow these instructions at home: ?Eating and drinking ? ?Eat a diet that includes fresh fruits and vegetables, whole grains, lean protein, and low-fat dairy products. ?Take vitamin and mineral supplements as recommended by your health care provider. ?Do not drink alcohol if: ?Your health care provider tells you not to drink. ?You are pregnant, may be pregnant, or are planning to become pregnant. ?If you drink alcohol: ?Limit how much you have to 0-1 drink a day. ?Know how much alcohol is in your drink. In the [place] one drink equals one 12 oz bottle of beer (355 mL), one 5 oz glass of wine (148 mL), or one 1? oz glass of hard liquor (44 mL). ?Lifestyle ?Brush your teeth every morning and night with fluoride toothpaste. Floss one time each day. ?Exercise for at least 30 minutes 5 or more days each week. ?Do not use any products that contain nicotine or tobacco. These products include cigarettes, chewing tobacco, and vaping devices, such as e-cigarettes. If you need help quitting, ask your health care provider. ?Do not use drugs. ?If you are sexually active, practice safe sex. Use a condom or other form of protection to prevent STIs. ?If you do not wish to become pregnant, use a form of birth control. If you plan to become pregnant, see your health care provider for a prepregnancy visit. ?Take aspirin only as told by your health care provider. Make sure that you understand how much to take and what form to take. Work with your health care provider to find out whether it is safe and beneficial for you to take aspirin daily. ?Find healthy ways to manage stress, such as: ?Meditation, yoga, or listening to music. ?Journaling. ?Talking to a trusted person. ?Spending time with friends and family. ?Minimize exposure to UV radiation to reduce your risk of skin  cancer. ?Safety ?Always wear your seat belt while driving or riding in a vehicle. ?Do not drive: ?If you have been drinking alcohol. Do not ride with someone who has been drinking. ?When you are tired or distracted. ?While texting. ?If you have been using any mind-altering substances or drugs. ?Wear a helmet and other protective equipment during sports activities. ?If you have firearms in your house, make sure you follow all gun safety procedures. ?Seek help if you have been physically or sexually abused. ?What's next? ?Visit your health care provider once a year for an annual wellness visit. ?Ask your health care provider how often you should have your eyes and teeth checked. ?Stay up to date on all vaccines. ?This information is not intended to replace advice given to you by your health care provider. Make sure you discuss any questions you have with your health care provider. ?Document Revised: 08/26/2020 Document Reviewed: 08/26/2020 ?Elsevier Patient Education ? [employer]. ? ?

## 2021-06-08 NOTE — Telephone Encounter (Signed)
PA approved.  ? ?Request Reference Number: [id number]. SAXENDA INJ '18MG'$ /3ML is approved through 12/09/2021. Your patient may now fill this prescription and it will be covered. ?

## 2021-06-08 NOTE — Telephone Encounter (Signed)
PA initiated via Covermymeds; KEY:  [id number]. Awaiting determination.  ?

## 2021-06-09 ENCOUNTER — Other Ambulatory Visit: Payer: Self-pay | Admitting: Family Medicine

## 2021-06-11 LAB — ANTI-NUCLEAR AB-TITER (ANA TITER): ANA Titer 1: 1:40 {titer} — ABNORMAL HIGH

## 2021-06-11 LAB — RHEUMATOID FACTOR: Rheumatoid fact SerPl-aCnc: <14 IU/mL (ref <14)

## 2021-06-11 LAB — ANA: Anti Nuclear Antibody (ANA): POSITIVE — AB

## 2021-06-11 NOTE — Addendum Note (Signed)
Addended by: Anabel Halon on: 06/11/2021 04:49 PM ? ? Modules accepted: Orders ? ?

## 2021-06-15 ENCOUNTER — Encounter: Payer: Self-pay | Admitting: Medical

## 2021-06-21 ENCOUNTER — Other Ambulatory Visit: Payer: Self-pay | Admitting: Family Medicine

## 2021-06-22 ENCOUNTER — Encounter: Payer: Self-pay | Admitting: *Deleted

## 2021-06-22 MED ORDER — AMPHETAMINE-DEXTROAMPHETAMINE 20 MG PO TABS: 20.0000 mg | ORAL_TABLET | Freq: Three times a day (TID) | ORAL | 0 refills | Status: DC

## 2021-06-22 NOTE — Telephone Encounter (Signed)
Requesting: Adderall '20mg'$  ?Contract:  ?UDS: 08/15/19 ?Last Visit: 06/08/21 ?Next Visit: none ?Last Refill: 05/17/21 ? ?Please Advise ? ?

## 2021-06-22 NOTE — Telephone Encounter (Signed)
Can you resend the referral please ?

## 2021-07-22 ENCOUNTER — Encounter: Payer: Self-pay | Admitting: Family Medicine

## 2021-07-25 ENCOUNTER — Other Ambulatory Visit: Payer: Self-pay | Admitting: Family Medicine

## 2021-07-25 ENCOUNTER — Other Ambulatory Visit: Payer: Self-pay | Admitting: Medical

## 2021-07-26 ENCOUNTER — Telehealth: Payer: Self-pay | Admitting: Family Medicine

## 2021-07-26 MED ORDER — SAXENDA 18 MG/3ML ~~LOC~~ SOPN: 3.0000 mg | PEN_INJECTOR | Freq: Every day | SUBCUTANEOUS | 11 refills | Status: DC

## 2021-07-26 NOTE — Addendum Note (Signed)
Addended by: Anabel Halon on: 07/26/2021 10:24 PM ? ? Modules accepted: Orders ? ?

## 2021-07-26 NOTE — Telephone Encounter (Signed)
Pharmacy is requesting clarification on saxenda. Please advise.  ? ? ?[facility], [address] [place] [address]  ?Phone:  [phone]  Fax:  [phone]  ?

## 2021-07-26 NOTE — Telephone Encounter (Signed)
Requesting: Adderall '20mg'$  ?Contract: will get at next visit ?UDS: 01/23/20 ?Last Visit: 06/08/21 ?Next Visit: 11/04/21 ?Last Refill: 06/22/21 ? ?Please Advise ? ?

## 2021-07-26 NOTE — Telephone Encounter (Addendum)
Pharmacy called wanting to know what the directions for saxenda since patient has already did the taper  ?"  ?Inject 0.6 mg into the skin daily for 7 days, THEN 1.2 mg daily for 7 days, THEN 1.8 mg daily for 7 days, THEN 1.8 mg daily for 7 days, THEN 2.4 mg daily for 7 days, THEN 3 mg daily for 7 days.  ?     ? ?

## 2021-07-27 NOTE — Telephone Encounter (Signed)
3 mg sent in by [name] ? ?Did not contact patient due to phone note in chart on 07/22/21 .  ?

## 2021-08-31 ENCOUNTER — Other Ambulatory Visit: Payer: Self-pay | Admitting: Family Medicine

## 2021-08-31 NOTE — Telephone Encounter (Signed)
Requesting: Adderall '20mg'$   Contract:01/23/20 UDS: 01/23/20 Last Visit: 06/08/21 Next Visit: 11/04/21 Last Refill: 07/26/21 #90 and 0RF  Please Advise

## 2021-10-12 ENCOUNTER — Other Ambulatory Visit: Payer: Self-pay | Admitting: Family Medicine

## 2021-10-12 NOTE — Telephone Encounter (Signed)
Requesting: Adderall '20mg'$   Contract:01/23/20 UDS: 01/23/20 Last Visit: 06/08/21 w/ Percell Miller  Next Visit: 11/04/21 Last Refill: 08/31/21 #90 and 0RF  Please Advise

## 2021-11-04 ENCOUNTER — Ambulatory Visit: Payer: PRIVATE HEALTH INSURANCE | Admitting: Family Medicine

## 2021-11-04 VITALS — BP 136/82 | HR 65 | Temp 97.9°F | Resp 16 | Ht 63.0 in | Wt 189.4 lb

## 2021-11-04 DIAGNOSIS — R7989 Other specified abnormal findings of blood chemistry: Secondary | ICD-10-CM

## 2021-11-04 DIAGNOSIS — E559 Vitamin D deficiency, unspecified: Secondary | ICD-10-CM | POA: Diagnosis not present

## 2021-11-04 DIAGNOSIS — Q244 Congenital subaortic stenosis: Secondary | ICD-10-CM

## 2021-11-04 DIAGNOSIS — Z79899 Other long term (current) drug therapy: Secondary | ICD-10-CM

## 2021-11-04 DIAGNOSIS — E669 Obesity, unspecified: Secondary | ICD-10-CM

## 2021-11-04 DIAGNOSIS — F32A Depression, unspecified: Secondary | ICD-10-CM

## 2021-11-04 DIAGNOSIS — F419 Anxiety disorder, unspecified: Secondary | ICD-10-CM | POA: Diagnosis not present

## 2021-11-04 DIAGNOSIS — R7303 Prediabetes: Secondary | ICD-10-CM

## 2021-11-04 DIAGNOSIS — F988 Other specified behavioral and emotional disorders with onset usually occurring in childhood and adolescence: Secondary | ICD-10-CM

## 2021-11-04 DIAGNOSIS — Z952 Presence of prosthetic heart valve: Secondary | ICD-10-CM

## 2021-11-04 DIAGNOSIS — E785 Hyperlipidemia, unspecified: Secondary | ICD-10-CM | POA: Diagnosis not present

## 2021-11-04 LAB — COMPREHENSIVE METABOLIC PANEL
ALT: 19 U/L (ref 0–35)
AST: 19 U/L (ref 0–37)
Albumin: 4.6 g/dL (ref 3.5–5.2)
Alkaline Phosphatase: 52 U/L (ref 39–117)
BUN: 15 mg/dL (ref 6–23)
CO2: 31 mEq/L (ref 19–32)
Calcium: 9.7 mg/dL (ref 8.4–10.5)
Chloride: 102 mEq/L (ref 96–112)
Creatinine, Ser: 0.87 mg/dL (ref 0.40–1.20)
GFR: 76.75 mL/min (ref >60.00)
Glucose, Bld: 113 mg/dL — ABNORMAL HIGH (ref 70–99)
Potassium: 4.7 mEq/L (ref 3.5–5.1)
Sodium: 140 mEq/L (ref 135–145)
Total Bilirubin: 0.6 mg/dL (ref 0.2–1.2)
Total Protein: 7.3 g/dL (ref 6.0–8.3)

## 2021-11-04 LAB — LIPID PANEL
Cholesterol: 217 mg/dL — ABNORMAL HIGH (ref 0–200)
HDL: 51.6 mg/dL (ref >39.00)
LDL Cholesterol: 144 mg/dL — ABNORMAL HIGH (ref 0–99)
NonHDL: 165.84
Total CHOL/HDL Ratio: 4
Triglycerides: 110 mg/dL (ref 0.0–149.0)
VLDL: 22 mg/dL (ref 0.0–40.0)

## 2021-11-04 LAB — CBC
HCT: 39.3 % (ref 36.0–46.0)
Hemoglobin: 13.4 g/dL (ref 12.0–15.0)
MCHC: 34.2 g/dL (ref 30.0–36.0)
MCV: 97.3 fl (ref 78.0–100.0)
Platelets: 272 10*3/uL (ref 150.0–400.0)
RBC: 4.04 Mil/uL (ref 3.87–5.11)
RDW: 12.9 % (ref 11.5–15.5)
WBC: 6.8 10*3/uL (ref 4.0–10.5)

## 2021-11-04 LAB — VITAMIN B12: Vitamin B-12: 488 pg/mL (ref 211–911)

## 2021-11-04 LAB — VITAMIN D 25 HYDROXY (VIT D DEFICIENCY, FRACTURES): VITD: 35.61 ng/mL (ref 30.00–100.00)

## 2021-11-04 LAB — HEMOGLOBIN A1C: Hgb A1c MFr Bld: 5.5 % (ref 4.6–6.5)

## 2021-11-04 LAB — TSH: TSH: 3.3 u[IU]/mL (ref 0.35–5.50)

## 2021-11-04 MED ORDER — AMPHETAMINE-DEXTROAMPHETAMINE 20 MG PO TABS
ORAL_TABLET | ORAL | 0 refills | Status: DC
Start: 2021-11-04 — End: 2022-02-28

## 2021-11-04 MED ORDER — AMPHETAMINE-DEXTROAMPHETAMINE 20 MG PO TABS
ORAL_TABLET | ORAL | 0 refills | Status: DC
Start: 2021-11-04 — End: 2021-11-04

## 2021-11-04 MED ORDER — ESCITALOPRAM OXALATE 5 MG PO TABS: 5.0000 mg | ORAL_TABLET | Freq: Every day | ORAL | 3 refills | Status: DC

## 2021-11-04 MED ORDER — ATORVASTATIN CALCIUM 20 MG PO TABS: 60.0000 mg | ORAL_TABLET | Freq: Every day | ORAL | 1 refills | Status: DC

## 2021-11-04 MED ORDER — WEGOVY 2.4 MG/0.75ML ~~LOC~~ SOAJ: 2.4000 mg | SUBCUTANEOUS | 1 refills | Status: DC

## 2021-11-04 MED ORDER — AMPHETAMINE-DEXTROAMPHETAMINE 20 MG PO TABS: 20.0000 mg | ORAL_TABLET | Freq: Three times a day (TID) | ORAL | 0 refills | Status: DC

## 2021-11-04 MED ORDER — AMPHETAMINE-DEXTROAMPHETAMINE 20 MG PO TABS
20.0000 mg | ORAL_TABLET | Freq: Three times a day (TID) | ORAL | 0 refills | Status: DC
Start: 2021-11-04 — End: 2022-02-28

## 2021-11-04 NOTE — Assessment & Plan Note (Signed)
Supplement and monitor 

## 2021-11-04 NOTE — Assessment & Plan Note (Signed)
Continue to monitor

## 2021-11-04 NOTE — Patient Instructions (Addendum)
Nutrofol or Multivtamin with minerals and fish oil daily  Shingrix is the new shingles shot, 2 shots over 2-6 months, confirm coverage with insurance and document, then can return here for shots with nurse appt or at pharmacy   Encouraged increased hydration and fiber in diet. Daily probiotics. If bowels not moving can use MOM 2 tbls po in 4 oz of warm prune juice by mouth every 2-3 days. If no results then repeat in 4 hours with  Dulcolax suppository pr, may repeat again in 4 more hours as needed. Seek care if symptoms worsen. Consider daily Miralax and Benefiber and/or Dulcolax if symptoms persist.     Alopecia Areata, Adult  Alopecia areata is a condition that causes hair loss. A person with this condition may lose hair on the scalp in patches. In some cases, a person may lose all the hair on the scalp or all the hair from the face and body. Having this condition can be emotionally difficult, but it is not dangerous. Alopecia areata is an autoimmune disease. This means that your body's defense system (immune system) mistakes normal parts of the body for germs or other things that can make you sick. When you have alopecia areata, the immune system attacks the hair follicles. What are the causes? The cause of this condition is not known. What increases the risk? You are more likely to develop this condition if you have: A family history of alopecia. A family history of another autoimmune disease, including type 1 diabetes and thyroid autoimmune disease. Eczema, asthma, and allergies. Down syndrome. What are the signs or symptoms? The main symptom of this condition is round spots of patchy hair loss on the scalp. The spots may be mildly itchy. Other symptoms include: Short dark hairs in the bald patches that are wider at the top (exclamation point hairs). Dents, white spots, or lines in the fingernails or toenails. Balding and body hair loss. This is rare. Alopecia areata usually develops in  childhood, but it can develop at any age. For some people, their hair grows back on its own and hair loss does not happen again. For others, their hair may fall out and grow back in cycles. The hair loss may last many years. How is this diagnosed? This condition is diagnosed based on your symptoms and family history. Your health care provider will also check your scalp skin, teeth, and nails. Your health care provider may refer you to a specialist in hair and skin disorders (dermatologist). You may also have tests, including: A hair pull test. Blood tests or other screening tests to check for autoimmune diseases, such as thyroid disease or diabetes. Skin biopsy to confirm the diagnosis. A procedure to examine the skin with a lighted magnifying instrument (dermoscopy). How is this treated? There is no cure for alopecia areata. The goals of treatment are to promote the regrowth of hair and prevent the immune system from overreacting. No single treatment is right for all people with alopecia areata. It depends on the type of hair loss you have and how severe it is. Work with your health care provider to find the best treatment for you. Treatment may include: Regular checkups to make sure the condition is not getting worse . This is called watchful waiting. Using steroid creams or pills for 6-8 weeks to stop the immune reaction and help hair to regrow more quickly. Using other medicines on your skin (topical medicines) to change the immune system response and support the hair growth  cycle. Steroid injections. Therapy and counseling with a support group or therapist if you are having trouble coping with hair loss. Follow these instructions at home: Medicines Apply topical creams only as told by your health care provider. Take over-the-counter and prescription medicines only as told by your health care provider. General instructions Learn as much as you can about your condition. Consider getting a  wig or products to make hair look fuller or to cover bald spots, if you feel uncomfortable with your appearance. Get therapy or counseling if you are having a hard time coping with hair loss. Ask your health care provider to recommend a counselor or support group. Keep all follow-up visits as told by your health care provider. This is important. Where to find more information [employer] Contact a health care provider if: Your hair loss gets worse, even with treatment. You have new symptoms. You are struggling emotionally. Get help right away if: You have a sudden worsening of the hair loss. Summary Alopecia areata is an autoimmune condition that makes your body's defense system (immune system) attack the hair follicles. This causes you to lose hair. Having this condition can be emotionally difficult, but it is not dangerous. Treatments may include regular checkups to make sure that the condition is not getting worse, medicines, and steroid injections. This information is not intended to replace advice given to you by your health care provider. Make sure you discuss any questions you have with your health care provider. Document Revised: 05/14/2019 Document Reviewed: 05/14/2019 Elsevier Patient Education  [employer].

## 2021-11-04 NOTE — Assessment & Plan Note (Signed)
Encourage heart healthy diet such as MIND or DASH diet, increase exercise, avoid trans fats, simple carbohydrates and processed foods, consider a krill or fish or flaxseed oil cap daily.  °

## 2021-11-04 NOTE — Assessment & Plan Note (Signed)
hgba1c acceptable, minimize simple carbs. Increase exercise as tolerated. Continue current meds 

## 2021-11-04 NOTE — Assessment & Plan Note (Signed)
monitor

## 2021-11-04 NOTE — Assessment & Plan Note (Signed)
Has done well after surgery
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
## 2021-11-05 ENCOUNTER — Telehealth: Payer: Self-pay

## 2021-11-05 NOTE — Assessment & Plan Note (Signed)
Continues to tolerate coumadin

## 2021-11-05 NOTE — Assessment & Plan Note (Signed)
Encouraged DASH or MIND diet, decrease po intake and increase exercise as tolerated. Needs 7-8 hours of sleep nightly. Avoid trans fats, eat small, frequent meals every 4-5 hours with lean proteins, complex carbs and healthy fats. Minimize simple carbs, high fat foods and processed foods. Is bruising notably by daily Saxenda secondary to coumadin use. Will switch to Wegovy 2.4 mg dose weekly

## 2021-11-05 NOTE — Telephone Encounter (Signed)
PA initiated via Covermymeds; KEY: [id number]. Awaiting determination .

## 2021-11-05 NOTE — Telephone Encounter (Signed)
PA approved.   Request Reference Number: [id number]. WEGOVY INJ 2.'4MG'$  is approved through 06/06/2022. Your patient may now fill this prescription and it will be covered.

## 2021-11-05 NOTE — Telephone Encounter (Signed)
Called pt Lvm that [name] was approved

## 2021-11-05 NOTE — Assessment & Plan Note (Signed)
Continue current meds as she tolerates them and denies concerning side effects. Given refills on her Adderall today

## 2021-11-05 NOTE — Assessment & Plan Note (Signed)
Her stress and anxiety have increased since her boyfriends cancer diagnosis. She agrees to continue current meds but to add Lexapro 5 mg po daily and reevaluate

## 2021-11-06 LAB — DM TEMPLATE

## 2021-11-08 ENCOUNTER — Other Ambulatory Visit: Payer: Self-pay | Admitting: Family Medicine

## 2021-11-08 DIAGNOSIS — R739 Hyperglycemia, unspecified: Secondary | ICD-10-CM

## 2021-11-08 DIAGNOSIS — Z0000 Encounter for general adult medical examination without abnormal findings: Secondary | ICD-10-CM

## 2021-11-08 DIAGNOSIS — E785 Hyperlipidemia, unspecified: Secondary | ICD-10-CM

## 2021-11-08 LAB — DM TEMPLATE

## 2021-11-08 LAB — DRUG MONITORING PANEL 376104, URINE
Amphetamine: 9360 ng/mL — ABNORMAL HIGH (ref <250)
Amphetamines: POSITIVE ng/mL — AB (ref <500)
Barbiturates: NEGATIVE ng/mL (ref <300)
Benzodiazepines: NEGATIVE ng/mL (ref <100)
Cocaine Metabolite: NEGATIVE ng/mL (ref <150)
Desmethyltramadol: NEGATIVE ng/mL (ref <100)
Methamphetamine: NEGATIVE ng/mL (ref <250)
Opiates: NEGATIVE ng/mL (ref <100)
Oxycodone: NEGATIVE ng/mL (ref <100)
Tramadol: NEGATIVE ng/mL (ref <100)

## 2021-12-17 ENCOUNTER — Telehealth: Payer: Self-pay | Admitting: Family Medicine

## 2021-12-17 ENCOUNTER — Other Ambulatory Visit: Payer: Self-pay

## 2021-12-17 MED ORDER — WEGOVY 2.4 MG/0.75ML ~~LOC~~ SOAJ: 2.4000 mg | SUBCUTANEOUS | 1 refills | Status: DC

## 2021-12-17 NOTE — Telephone Encounter (Signed)
Medication: Semaglutide-Weight Management (WEGOVY) 2.4 MG/0.75ML SOAJ [[id number]]   Has the patient contacted their pharmacy? No. (If no, request that the patient contact the pharmacy for the refill.) (If yes, when and what did the pharmacy advise?)  Preferred Pharmacy (with phone number or street name):  [facility], [address] [place] [address] Phone: [phone]  Fax: [phone]    Agent: Please be advised that RX refills may take up to 3 business days. We ask that you follow-up with your pharmacy.

## 2022-01-19 ENCOUNTER — Other Ambulatory Visit: Payer: Self-pay | Admitting: Medical

## 2022-01-19 ENCOUNTER — Other Ambulatory Visit: Payer: Self-pay | Admitting: Family Medicine

## 2022-01-23 ENCOUNTER — Other Ambulatory Visit: Payer: Self-pay | Admitting: Family Medicine

## 2022-02-15 ENCOUNTER — Other Ambulatory Visit: Payer: Self-pay | Admitting: Family Medicine

## 2022-02-28 ENCOUNTER — Other Ambulatory Visit: Payer: Self-pay | Admitting: Family Medicine

## 2022-02-28 NOTE — Telephone Encounter (Signed)
Requesting: Adderall '20mg'$   Contract: 11/04/21 UDS: 11/04/21 Last Visit: 11/04/21 Next Visit: 06/30/22 Last Refill: 11/04/21 #90 and 0RF (x3)  Please Advise

## 2022-04-10 ENCOUNTER — Other Ambulatory Visit: Payer: Self-pay | Admitting: Family Medicine

## 2022-04-12 NOTE — Telephone Encounter (Signed)
Requesting:adderall 20 mg Contract:11/04/21 UDS:11/04/21 Last Visit:11/04/21 Next Visit:06/30/22 Last Refill:12/18//23  Please Advise

## 2022-04-13 ENCOUNTER — Encounter: Payer: Self-pay | Admitting: Family Medicine

## 2022-04-13 NOTE — Telephone Encounter (Signed)
Pt states she has an appt coming up in April and was told she did not need to make an appt for sooner. She is completely out of medication and is not able to take time off from work. Please advise pt what can be done.   amphetamine-dextroamphetamine (ADDERALL) 20 MG tablet  WEGOVY 2.4 MG/0.75ML SOAJ

## 2022-05-12 ENCOUNTER — Other Ambulatory Visit: Payer: Self-pay | Admitting: Family Medicine

## 2022-05-12 DIAGNOSIS — E785 Hyperlipidemia, unspecified: Secondary | ICD-10-CM

## 2022-05-12 DIAGNOSIS — R739 Hyperglycemia, unspecified: Secondary | ICD-10-CM

## 2022-05-12 DIAGNOSIS — Z0000 Encounter for general adult medical examination without abnormal findings: Secondary | ICD-10-CM

## 2022-05-12 NOTE — Telephone Encounter (Signed)
Requesting: adderall Contract:11/04/21 UDS:11/04/21 Last Visit:10/13/21 Next Visit:06/30/22 Last Refill:04/13/22  Please Advise

## 2022-06-15 ENCOUNTER — Other Ambulatory Visit: Payer: Self-pay

## 2022-06-15 MED ORDER — WEGOVY 2.4 MG/0.75ML ~~LOC~~ SOAJ: SUBCUTANEOUS | 1 refills | Status: DC

## 2022-06-16 ENCOUNTER — Telehealth: Payer: Self-pay

## 2022-06-16 ENCOUNTER — Other Ambulatory Visit: Payer: Self-pay | Admitting: Family Medicine

## 2022-06-16 ENCOUNTER — Encounter: Payer: Self-pay | Admitting: Family Medicine

## 2022-06-16 MED ORDER — ATORVASTATIN CALCIUM 20 MG PO TABS: 60.0000 mg | ORAL_TABLET | Freq: Every day | ORAL | 1 refills | Status: DC

## 2022-06-16 NOTE — Telephone Encounter (Signed)
Sent pt mychart message regarding [id number].

## 2022-06-16 NOTE — Telephone Encounter (Signed)
Needs updated OV for weight and bmi for [facility].

## 2022-06-16 NOTE — Telephone Encounter (Signed)
Medication sent.

## 2022-06-16 NOTE — Telephone Encounter (Signed)
Requesting: Adderall 20 mg - 1 tablet TID Contract: 11/16/2021 UDS: 11/04/2021 Last Visit: 11/04/2021 Next Visit: 06/30/2022 Last Refill: 05/12/2022 #90 no refills  Please Advise

## 2022-06-20 NOTE — Telephone Encounter (Signed)
Appt scheduled 06/30/22

## 2022-06-29 NOTE — Assessment & Plan Note (Signed)
Doing well on current meds no changes, uds and contract updated

## 2022-06-29 NOTE — Assessment & Plan Note (Signed)
Encourage heart healthy diet such as MIND or DASH diet, increase exercise, avoid trans fats, simple carbohydrates and processed foods, consider a krill or fish or flaxseed oil cap daily.  °

## 2022-06-29 NOTE — Assessment & Plan Note (Signed)
Patient encouraged to maintain heart healthy diet, regular exercise, adequate sleep. Consider daily probiotics. Take medications as prescribe. MGM 2024 at [facility] OB pap was 2023 [facility] OB Colonoscopy 2019 needs repeating this year referred to [facility] ordered and reviewed Given and reviewed copy of ACP documents from [employer] and encouraged to complete and return

## 2022-06-29 NOTE — Assessment & Plan Note (Signed)
monitor

## 2022-06-29 NOTE — Assessment & Plan Note (Signed)
Encouraged DASH or MIND diet, decrease po intake and increase exercise as tolerated. Needs 7-8 hours of sleep nightly. Avoid trans fats, eat small, frequent meals every 4-5 hours with lean proteins, complex carbs and healthy fats. Minimize simple carbs, high fat foods and processed foods. Tolerating [phone] but she platued and she is interested in considering Zepbound but will wait for now til the supply chain improves

## 2022-06-29 NOTE — Assessment & Plan Note (Signed)
Doing well on Lexapro 5 and Wellbutrin

## 2022-06-29 NOTE — Assessment & Plan Note (Signed)
Supplement and monitor 

## 2022-06-30 ENCOUNTER — Other Ambulatory Visit: Payer: Self-pay | Admitting: Family Medicine

## 2022-06-30 ENCOUNTER — Ambulatory Visit (INDEPENDENT_AMBULATORY_CARE_PROVIDER_SITE_OTHER): Payer: PRIVATE HEALTH INSURANCE | Admitting: Family Medicine

## 2022-06-30 ENCOUNTER — Telehealth: Payer: Self-pay

## 2022-06-30 VITALS — BP 138/74 | HR 72 | Temp 97.5°F | Resp 16 | Ht 62.0 in | Wt 170.6 lb

## 2022-06-30 DIAGNOSIS — K5900 Constipation, unspecified: Secondary | ICD-10-CM

## 2022-06-30 DIAGNOSIS — Z0001 Encounter for general adult medical examination with abnormal findings: Secondary | ICD-10-CM

## 2022-06-30 DIAGNOSIS — R739 Hyperglycemia, unspecified: Secondary | ICD-10-CM | POA: Insufficient documentation

## 2022-06-30 DIAGNOSIS — E559 Vitamin D deficiency, unspecified: Secondary | ICD-10-CM

## 2022-06-30 DIAGNOSIS — F419 Anxiety disorder, unspecified: Secondary | ICD-10-CM

## 2022-06-30 DIAGNOSIS — E669 Obesity, unspecified: Secondary | ICD-10-CM | POA: Diagnosis not present

## 2022-06-30 DIAGNOSIS — L309 Dermatitis, unspecified: Secondary | ICD-10-CM

## 2022-06-30 DIAGNOSIS — E785 Hyperlipidemia, unspecified: Secondary | ICD-10-CM | POA: Diagnosis not present

## 2022-06-30 DIAGNOSIS — R3911 Hesitancy of micturition: Secondary | ICD-10-CM

## 2022-06-30 DIAGNOSIS — F988 Other specified behavioral and emotional disorders with onset usually occurring in childhood and adolescence: Secondary | ICD-10-CM | POA: Diagnosis not present

## 2022-06-30 DIAGNOSIS — R7989 Other specified abnormal findings of blood chemistry: Secondary | ICD-10-CM

## 2022-06-30 DIAGNOSIS — I351 Nonrheumatic aortic (valve) insufficiency: Secondary | ICD-10-CM

## 2022-06-30 DIAGNOSIS — M25552 Pain in left hip: Secondary | ICD-10-CM

## 2022-06-30 DIAGNOSIS — F32A Depression, unspecified: Secondary | ICD-10-CM

## 2022-06-30 DIAGNOSIS — M25551 Pain in right hip: Secondary | ICD-10-CM

## 2022-06-30 DIAGNOSIS — Z79899 Other long term (current) drug therapy: Secondary | ICD-10-CM

## 2022-06-30 DIAGNOSIS — Z86010 Personal history of colonic polyps: Secondary | ICD-10-CM

## 2022-06-30 DIAGNOSIS — Z0000 Encounter for general adult medical examination without abnormal findings: Secondary | ICD-10-CM

## 2022-06-30 LAB — POC URINALSYSI DIPSTICK (AUTOMATED)
Bilirubin, UA: NEGATIVE
Blood, UA: NEGATIVE
Glucose, UA: NEGATIVE
Ketones, UA: NEGATIVE
Nitrite, UA: NEGATIVE
Protein, UA: NEGATIVE
Spec Grav, UA: 1.015 (ref 1.010–1.025)
Urobilinogen, UA: 0.2 E.U./dL
pH, UA: 6.5 (ref 5.0–8.0)

## 2022-06-30 LAB — CBC WITH DIFFERENTIAL/PLATELET
Basophils Absolute: 0 10*3/uL (ref 0.0–0.1)
Basophils Relative: 0.8 % (ref 0.0–3.0)
Eosinophils Absolute: 0.2 10*3/uL (ref 0.0–0.7)
Eosinophils Relative: 2.5 % (ref 0.0–5.0)
HCT: 36.5 % (ref 36.0–46.0)
Hemoglobin: 12.6 g/dL (ref 12.0–15.0)
Lymphocytes Relative: 31.8 % (ref 12.0–46.0)
Lymphs Abs: 2 10*3/uL (ref 0.7–4.0)
MCHC: 34.4 g/dL (ref 30.0–36.0)
MCV: 93.8 fl (ref 78.0–100.0)
Monocytes Absolute: 0.5 10*3/uL (ref 0.1–1.0)
Monocytes Relative: 8.1 % (ref 3.0–12.0)
Neutro Abs: 3.5 10*3/uL (ref 1.4–7.7)
Neutrophils Relative %: 56.8 % (ref 43.0–77.0)
Platelets: 293 10*3/uL (ref 150.0–400.0)
RBC: 3.89 Mil/uL (ref 3.87–5.11)
RDW: 12.7 % (ref 11.5–15.5)
WBC: 6.1 10*3/uL (ref 4.0–10.5)

## 2022-06-30 LAB — COMPREHENSIVE METABOLIC PANEL
ALT: 18 U/L (ref 0–35)
AST: 22 U/L (ref 0–37)
Albumin: 4.5 g/dL (ref 3.5–5.2)
Alkaline Phosphatase: 45 U/L (ref 39–117)
BUN: 12 mg/dL (ref 6–23)
CO2: 31 mEq/L (ref 19–32)
Calcium: 9.9 mg/dL (ref 8.4–10.5)
Chloride: 102 mEq/L (ref 96–112)
Creatinine, Ser: 0.79 mg/dL (ref 0.40–1.20)
GFR: 85.78 mL/min (ref >60.00)
Glucose, Bld: 95 mg/dL (ref 70–99)
Potassium: 4.5 mEq/L (ref 3.5–5.1)
Sodium: 140 mEq/L (ref 135–145)
Total Bilirubin: 0.8 mg/dL (ref 0.2–1.2)
Total Protein: 7.1 g/dL (ref 6.0–8.3)

## 2022-06-30 LAB — LIPID PANEL
Cholesterol: 162 mg/dL (ref 0–200)
HDL: 49.5 mg/dL (ref >39.00)
LDL Cholesterol: 94 mg/dL (ref 0–99)
NonHDL: 112.98
Total CHOL/HDL Ratio: 3
Triglycerides: 95 mg/dL (ref 0.0–149.0)
VLDL: 19 mg/dL (ref 0.0–40.0)

## 2022-06-30 LAB — TSH: TSH: 2.7 u[IU]/mL (ref 0.35–5.50)

## 2022-06-30 LAB — VITAMIN B12: Vitamin B-12: 402 pg/mL (ref 211–911)

## 2022-06-30 LAB — HEMOGLOBIN A1C: Hgb A1c MFr Bld: 5.2 % (ref 4.6–6.5)

## 2022-06-30 LAB — VITAMIN D 25 HYDROXY (VIT D DEFICIENCY, FRACTURES): VITD: 52.8 ng/mL (ref 30.00–100.00)

## 2022-06-30 MED ORDER — AMPHETAMINE-DEXTROAMPHETAMINE 20 MG PO TABS: ORAL_TABLET | ORAL | 0 refills | Status: DC

## 2022-06-30 MED ORDER — AMPHETAMINE-DEXTROAMPHETAMINE 20 MG PO TABS: 20.0000 mg | ORAL_TABLET | Freq: Three times a day (TID) | ORAL | 0 refills | Status: DC

## 2022-06-30 MED ORDER — WEGOVY 2.4 MG/0.75ML ~~LOC~~ SOAJ: SUBCUTANEOUS | 5 refills | Status: DC

## 2022-06-30 MED ORDER — LINACLOTIDE 72 MCG PO CAPS: 72.0000 ug | ORAL_CAPSULE | Freq: Every day | ORAL | 5 refills | Status: AC

## 2022-06-30 NOTE — Assessment & Plan Note (Signed)
Was on right eyelid and on hands she is seeing dermatology next month

## 2022-06-30 NOTE — Patient Instructions (Addendum)
Omron BP cuff, track bp and pulse and any symptoms at least monthly if not weekly notify us if any concerns  Kartia Mobile to track heart monitored   Shingrix is the new shingles shot, 2 shots over 2-6 months, confirm coverage with insurance and document, then can return here for shots with nurse appt or at pharmacy   Tetanus check with Health Work and get a copy and send to [name]   Psyllium husk powder capsules for constipation, 3 daily Triamcinolone cream for rash  Preventive Care 36-70 Years Old, Female Preventive care refers to lifestyle choices and visits with your health care provider that can promote health and wellness. Preventive care visits are also called wellness exams. What can I expect for my preventive care visit? Counseling Your health care provider may ask you questions about your: Medical history, including: Past medical problems. Family medical history. Pregnancy history. Current health, including: Menstrual cycle. Method of birth control. Emotional well-being. Home life and relationship well-being. Sexual activity and sexual health. Lifestyle, including: Alcohol, nicotine or tobacco, and drug use. Access to firearms. Diet, exercise, and sleep habits. Work and work Astronomer. Sunscreen use. Safety issues such as seatbelt and bike helmet use. Physical exam Your health care provider will check your: Height and weight. These may be used to calculate your BMI (body mass index). BMI is a measurement that tells if you are at a healthy weight. Waist circumference. This measures the distance around your waistline. This measurement also tells if you are at a healthy weight and may help predict your risk of certain diseases, such as type 2 diabetes and high blood pressure. Heart rate and blood pressure. Body temperature. Skin for abnormal spots. What immunizations do I need?  Vaccines are usually given at various ages, according to a schedule. Your health care  provider will recommend vaccines for you based on your age, medical history, and lifestyle or other factors, such as travel or where you work. What tests do I need? Screening Your health care provider may recommend screening tests for certain conditions. This may include: Lipid and cholesterol levels. Diabetes screening. This is done by checking your blood sugar (glucose) after you have not eaten for a while (fasting). Pelvic exam and Pap test. Hepatitis B test. Hepatitis C test. HIV (human immunodeficiency virus) test. STI (sexually transmitted infection) testing, if you are at risk. Lung cancer screening. Colorectal cancer screening. Mammogram. Talk with your health care provider about when you should start having regular mammograms. This may depend on whether you have a family history of breast cancer. BRCA-related cancer screening. This may be done if you have a family history of breast, ovarian, tubal, or peritoneal cancers. Bone density scan. This is done to screen for osteoporosis. Talk with your health care provider about your test results, treatment options, and if necessary, the need for more tests. Follow these instructions at home: Eating and drinking  Eat a diet that includes fresh fruits and vegetables, whole grains, lean protein, and low-fat dairy products. Take vitamin and mineral supplements as recommended by your health care provider. Do not drink alcohol if: Your health care provider tells you not to drink. You are pregnant, may be pregnant, or are planning to become pregnant. If you drink alcohol: Limit how much you have to 0-1 drink a day. Know how much alcohol is in your drink. In the [place] one drink equals one 12 oz bottle of beer (355 mL), one 5 oz glass of wine (148 mL), or one  1 oz glass of hard liquor (44 mL). Lifestyle Brush your teeth every morning and night with fluoride toothpaste. Floss one time each day. Exercise for at least 30 minutes 5 or more days  each week. Do not use any products that contain nicotine or tobacco. These products include cigarettes, chewing tobacco, and vaping devices, such as e-cigarettes. If you need help quitting, ask your health care provider. Do not use drugs. If you are sexually active, practice safe sex. Use a condom or other form of protection to prevent STIs. If you do not wish to become pregnant, use a form of birth control. If you plan to become pregnant, see your health care provider for a prepregnancy visit. Take aspirin only as told by your health care provider. Make sure that you understand how much to take and what form to take. Work with your health care provider to find out whether it is safe and beneficial for you to take aspirin daily. Find healthy ways to manage stress, such as: Meditation, yoga, or listening to music. Journaling. Talking to a trusted person. Spending time with friends and family. Minimize exposure to UV radiation to reduce your risk of skin cancer. Safety Always wear your seat belt while driving or riding in a vehicle. Do not drive: If you have been drinking alcohol. Do not ride with someone who has been drinking. When you are tired or distracted. While texting. If you have been using any mind-altering substances or drugs. Wear a helmet and other protective equipment during sports activities. If you have firearms in your house, make sure you follow all gun safety procedures. Seek help if you have been physically or sexually abused. What's next? Visit your health care provider once a year for an annual wellness visit. Ask your health care provider how often you should have your eyes and teeth checked. Stay up to date on all vaccines. This information is not intended to replace advice given to you by your health care provider. Make sure you discuss any questions you have with your health care provider. Document Revised: 08/26/2020 Document Reviewed: 08/26/2020 Elsevier Patient  Education  2023 Elsevier Inc.  

## 2022-06-30 NOTE — Telephone Encounter (Signed)
PA initiated via Covermymeds; KEY: [id number]. Awaiting determination.

## 2022-06-30 NOTE — Assessment & Plan Note (Signed)
Right hip responded to steroid injection then left hip acted up. Now better since she went back to the gym

## 2022-06-30 NOTE — Telephone Encounter (Signed)
PA approved.   Request Reference Number: [id number]. LINZESS CAP is approved through 06/30/2023. Your patient may now fill this prescription and it will be covered. Authorization Expiration Date: 06/30/2023

## 2022-06-30 NOTE — Assessment & Plan Note (Signed)
Has failed conservative management. Add Linzess 72 mcg daily and continue fiber and fluids and exercise
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
## 2022-07-01 LAB — URINE CULTURE
MICRO NUMBER:: 14843112
Result:: NO GROWTH
SPECIMEN QUALITY:: ADEQUATE

## 2022-07-02 LAB — DRUG MONITORING PANEL 376104, URINE
Amphetamine: 6731 ng/mL — ABNORMAL HIGH (ref <250)
Amphetamines: POSITIVE ng/mL — AB (ref <500)
Barbiturates: NEGATIVE ng/mL (ref <300)
Benzodiazepines: NEGATIVE ng/mL (ref <100)
Cocaine Metabolite: NEGATIVE ng/mL (ref <150)
Desmethyltramadol: NEGATIVE ng/mL (ref <100)
Methamphetamine: NEGATIVE ng/mL (ref <250)
Opiates: NEGATIVE ng/mL (ref <100)
Oxycodone: NEGATIVE ng/mL (ref <100)
Tramadol: NEGATIVE ng/mL (ref <100)

## 2022-07-02 LAB — DM TEMPLATE

## 2022-07-03 ENCOUNTER — Encounter: Payer: Self-pay | Admitting: Family Medicine

## 2022-07-03 NOTE — Assessment & Plan Note (Signed)
[id number] acceptable, minimize simple carbs. Increase exercise as tolerated.  

## 2022-07-03 NOTE — Assessment & Plan Note (Signed)
S/p valvular replacement and doing well. Continues to follow with cardiology

## 2022-07-04 ENCOUNTER — Telehealth: Payer: Self-pay

## 2022-07-04 NOTE — Telephone Encounter (Signed)
PA initiated via Covermymeds; KEY: [id number]. Awaiting determination.

## 2022-07-04 NOTE — Telephone Encounter (Signed)
PA approved.   Message from plan: Request Reference Number: [id number]. WEGOVY INJ 2.4MG  is approved through 01/03/2023. Your patient may now fill this prescription and it will be covered.. Authorization Expiration Date: January 03, 2023.

## 2022-07-18 ENCOUNTER — Encounter: Payer: Self-pay | Admitting: Family Medicine

## 2022-07-18 MED ORDER — OMEPRAZOLE 40 MG PO CPDR: DELAYED_RELEASE_CAPSULE | ORAL | 1 refills | Status: DC

## 2022-09-30 ENCOUNTER — Other Ambulatory Visit: Payer: Self-pay

## 2022-09-30 MED ORDER — METOPROLOL TARTRATE 50 MG PO TABS: 50.0000 mg | ORAL_TABLET | Freq: Two times a day (BID) | ORAL | 5 refills | Status: DC

## 2022-10-04 ENCOUNTER — Other Ambulatory Visit: Payer: Self-pay

## 2022-10-04 MED ORDER — METOPROLOL TARTRATE 50 MG PO TABS: 50.0000 mg | ORAL_TABLET | Freq: Two times a day (BID) | ORAL | 5 refills | Status: DC

## 2022-10-24 ENCOUNTER — Other Ambulatory Visit: Payer: Self-pay | Admitting: Family Medicine

## 2022-11-25 ENCOUNTER — Other Ambulatory Visit: Payer: Self-pay | Admitting: Family Medicine

## 2022-11-25 NOTE — Telephone Encounter (Signed)
Requesting: adderall  Contract:11/04/21  UDS:11/04/21  Last Visit:06/30/22  Next Visit:12/19/22  Last filled 10/25/2022 #90 no refills

## 2022-12-06 ENCOUNTER — Encounter: Payer: Self-pay | Admitting: Family Medicine

## 2022-12-06 DIAGNOSIS — E785 Hyperlipidemia, unspecified: Secondary | ICD-10-CM

## 2022-12-06 DIAGNOSIS — R739 Hyperglycemia, unspecified: Secondary | ICD-10-CM

## 2022-12-06 DIAGNOSIS — Z0000 Encounter for general adult medical examination without abnormal findings: Secondary | ICD-10-CM

## 2022-12-06 MED ORDER — BUPROPION HCL ER (XL) 300 MG PO TB24
300.0000 mg | ORAL_TABLET | Freq: Every day | ORAL | 1 refills | Status: DC
Start: 2022-12-06 — End: 2023-06-16

## 2022-12-18 NOTE — Assessment & Plan Note (Signed)
Doing well on current meds.

## 2022-12-18 NOTE — Assessment & Plan Note (Signed)
Encouraged increased hydration and fiber in diet. Daily probiotics. If bowels not moving can use MOM 2 tbls po in 4 oz of warm prune juice by mouth every 2-3 days. If no results then repeat in 4 hours with  Dulcolax suppository pr, may repeat again in 4 more hours as needed. Seek care if symptoms worsen. Consider daily Miralax and/or Dulcolax if symptoms persist.  

## 2022-12-18 NOTE — Assessment & Plan Note (Signed)
Encourage heart healthy diet such as MIND or DASH diet, increase exercise, avoid trans fats, simple carbohydrates and processed foods, consider a krill or fish or flaxseed oil cap daily.  °

## 2022-12-18 NOTE — Assessment & Plan Note (Signed)
[id number] acceptable, minimize simple carbs. Increase exercise as tolerated.  

## 2022-12-18 NOTE — Assessment & Plan Note (Signed)
Encouraged DASH or MIND diet, decrease po intake and increase exercise as tolerated. Needs 7-8 hours of sleep nightly. Avoid trans fats, eat small, frequent meals every 4-5 hours with lean proteins, complex carbs and healthy fats. Minimize simple carbs, high fat foods and processed foods. Tolerating [employer]

## 2022-12-18 NOTE — Assessment & Plan Note (Signed)
Following with cardiology and tolerating Coumadin

## 2022-12-19 ENCOUNTER — Ambulatory Visit: Payer: PRIVATE HEALTH INSURANCE | Admitting: Family Medicine

## 2022-12-19 ENCOUNTER — Encounter: Payer: Self-pay | Admitting: Family Medicine

## 2022-12-19 VITALS — BP 126/74 | HR 85 | Temp 98.0°F | Resp 16 | Ht 62.0 in | Wt 175.4 lb

## 2022-12-19 DIAGNOSIS — R739 Hyperglycemia, unspecified: Secondary | ICD-10-CM | POA: Diagnosis not present

## 2022-12-19 DIAGNOSIS — E669 Obesity, unspecified: Secondary | ICD-10-CM | POA: Diagnosis not present

## 2022-12-19 DIAGNOSIS — Z9889 Other specified postprocedural states: Secondary | ICD-10-CM

## 2022-12-19 DIAGNOSIS — Z860100 Personal history of colon polyps, unspecified: Secondary | ICD-10-CM

## 2022-12-19 DIAGNOSIS — E785 Hyperlipidemia, unspecified: Secondary | ICD-10-CM

## 2022-12-19 DIAGNOSIS — F909 Attention-deficit hyperactivity disorder, unspecified type: Secondary | ICD-10-CM | POA: Diagnosis not present

## 2022-12-19 DIAGNOSIS — K5900 Constipation, unspecified: Secondary | ICD-10-CM

## 2022-12-19 DIAGNOSIS — R7989 Other specified abnormal findings of blood chemistry: Secondary | ICD-10-CM

## 2022-12-19 DIAGNOSIS — Z952 Presence of prosthetic heart valve: Secondary | ICD-10-CM

## 2022-12-19 DIAGNOSIS — E559 Vitamin D deficiency, unspecified: Secondary | ICD-10-CM

## 2022-12-19 MED ORDER — ZEPBOUND 10 MG/0.5ML ~~LOC~~ SOAJ: 10.0000 mg | SUBCUTANEOUS | 1 refills | Status: DC

## 2022-12-19 MED ORDER — AMPHETAMINE-DEXTROAMPHETAMINE 20 MG PO TABS: 20.0000 mg | ORAL_TABLET | Freq: Three times a day (TID) | ORAL | 0 refills | Status: DC

## 2022-12-19 MED ORDER — AMPHETAMINE-DEXTROAMPHETAMINE 20 MG PO TABS: ORAL_TABLET | ORAL | 0 refills | Status: DC
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
## 2022-12-19 NOTE — Patient Instructions (Signed)
Shingrix is the new shingles shot, 2 shots over 2-6 months, confirm coverage with insurance and document, then can return here for shots with nurse appt or at pharmacy  

## 2022-12-20 LAB — CBC WITH DIFFERENTIAL/PLATELET
Basophils Absolute: 0.1 10*3/uL (ref 0.0–0.1)
Basophils Relative: 1 % (ref 0.0–3.0)
Eosinophils Absolute: 0.2 10*3/uL (ref 0.0–0.7)
Eosinophils Relative: 3.3 % (ref 0.0–5.0)
HCT: 40 % (ref 36.0–46.0)
Hemoglobin: 13.5 g/dL (ref 12.0–15.0)
Lymphocytes Relative: 32.9 % (ref 12.0–46.0)
Lymphs Abs: 1.9 10*3/uL (ref 0.7–4.0)
MCHC: 33.7 g/dL (ref 30.0–36.0)
MCV: 94.5 fL (ref 78.0–100.0)
Monocytes Absolute: 0.4 10*3/uL (ref 0.1–1.0)
Monocytes Relative: 7.2 % (ref 3.0–12.0)
Neutro Abs: 3.3 10*3/uL (ref 1.4–7.7)
Neutrophils Relative %: 55.6 % (ref 43.0–77.0)
Platelets: 286 10*3/uL (ref 150.0–400.0)
RBC: 4.23 Mil/uL (ref 3.87–5.11)
RDW: 12.8 % (ref 11.5–15.5)
WBC: 5.9 10*3/uL (ref 4.0–10.5)

## 2022-12-20 LAB — LIPID PANEL
Cholesterol: 184 mg/dL (ref 0–200)
HDL: 54.7 mg/dL (ref >39.00)
LDL Cholesterol: 92 mg/dL (ref 0–99)
NonHDL: 129.72
Total CHOL/HDL Ratio: 3
Triglycerides: 190 mg/dL — ABNORMAL HIGH (ref 0.0–149.0)
VLDL: 38 mg/dL (ref 0.0–40.0)

## 2022-12-20 LAB — COMPREHENSIVE METABOLIC PANEL
ALT: 25 U/L (ref 0–35)
AST: 23 U/L (ref 0–37)
Albumin: 4.5 g/dL (ref 3.5–5.2)
Alkaline Phosphatase: 53 U/L (ref 39–117)
BUN: 13 mg/dL (ref 6–23)
CO2: 31 meq/L (ref 19–32)
Calcium: 10.1 mg/dL (ref 8.4–10.5)
Chloride: 101 meq/L (ref 96–112)
Creatinine, Ser: 0.8 mg/dL (ref 0.40–1.20)
GFR: 84.21 mL/min (ref >60.00)
Glucose, Bld: 83 mg/dL (ref 70–99)
Potassium: 4.4 meq/L (ref 3.5–5.1)
Sodium: 137 meq/L (ref 135–145)
Total Bilirubin: 0.7 mg/dL (ref 0.2–1.2)
Total Protein: 7.3 g/dL (ref 6.0–8.3)

## 2022-12-20 LAB — HEMOGLOBIN A1C: Hgb A1c MFr Bld: 5.2 % (ref 4.6–6.5)

## 2022-12-20 LAB — VITAMIN B12: Vitamin B-12: 668 pg/mL (ref 211–911)

## 2022-12-20 LAB — TSH: TSH: 3.37 u[IU]/mL (ref 0.35–5.50)

## 2022-12-22 LAB — VITAMIN D 1,25 DIHYDROXY
Vitamin D 1, 25 (OH)2 Total: 22 pg/mL (ref 18–72)
Vitamin D2 1, 25 (OH)2: <8 pg/mL
Vitamin D3 1, 25 (OH)2: 22 pg/mL

## 2022-12-27 ENCOUNTER — Encounter: Payer: Self-pay | Admitting: Family Medicine

## 2022-12-29 ENCOUNTER — Encounter: Payer: Self-pay | Admitting: Family Medicine

## 2022-12-30 ENCOUNTER — Other Ambulatory Visit (HOSPITAL_COMMUNITY): Payer: Self-pay

## 2022-12-30 ENCOUNTER — Telehealth: Payer: Self-pay | Admitting: Pharmacy Technician

## 2022-12-30 NOTE — Telephone Encounter (Signed)
Noted  

## 2022-12-30 NOTE — Telephone Encounter (Signed)
Pharmacy Patient Advocate Encounter  Received notification from [facility] that Prior Authorization for Zepbound 10MG /0.5ML pen-injectors has been APPROVED from 12/30/2022 to 06/30/2023. Ran test claim, Copay is $45.00. This test claim was processed through [facility]- copay amounts may vary at other pharmacies due to pharmacy/plan contracts, or as the patient moves through the different stages of their insurance plan.   PA #/Case ID/Reference #: [id number] Key: [id number]

## 2023-01-10 ENCOUNTER — Other Ambulatory Visit: Payer: Self-pay | Admitting: Family Medicine

## 2023-01-18 ENCOUNTER — Encounter: Payer: Self-pay | Admitting: Gastroenterology

## 2023-02-03 LAB — HM MAMMOGRAPHY

## 2023-02-06 ENCOUNTER — Encounter: Payer: Self-pay | Admitting: Family Medicine

## 2023-02-23 ENCOUNTER — Encounter: Payer: Self-pay | Admitting: Family Medicine

## 2023-02-23 ENCOUNTER — Other Ambulatory Visit: Payer: Self-pay | Admitting: Family

## 2023-02-23 MED ORDER — ZEPBOUND 12.5 MG/0.5ML ~~LOC~~ SOAJ: 12.5000 mg | SUBCUTANEOUS | 1 refills | Status: DC

## 2023-03-01 ENCOUNTER — Other Ambulatory Visit: Payer: Self-pay | Admitting: Family Medicine

## 2023-04-09 ENCOUNTER — Other Ambulatory Visit: Payer: Self-pay | Admitting: Family Medicine

## 2023-04-10 ENCOUNTER — Telehealth: Payer: Self-pay

## 2023-04-10 NOTE — Telephone Encounter (Signed)
Called patient and let her know medication has been filled.

## 2023-04-10 NOTE — Telephone Encounter (Signed)
Requesting: Adderall 20mg   Contract: 06/30/22 UDS: 06/30/22 Last Visit: 12/19/22 Next Visit: None Last Refill: 03/02/23 #90 and 0RF   Please Advise

## 2023-04-10 NOTE — Telephone Encounter (Signed)
Copied from CRM [id number]. Topic: Clinical - Prescription Issue >> Apr 10, 2023  1:33 PM [name] wrote: Reason for CRM: patient called stating her adderall was denied, but she just saw the doctor on 10/7 and she did blood work. Pt would like a call back

## 2023-04-28 ENCOUNTER — Encounter: Payer: Self-pay | Admitting: Family Medicine

## 2023-04-28 ENCOUNTER — Other Ambulatory Visit: Payer: Self-pay

## 2023-04-28 MED ORDER — ZEPBOUND 12.5 MG/0.5ML ~~LOC~~ SOAJ: 12.5000 mg | SUBCUTANEOUS | 1 refills | Status: DC

## 2023-05-12 ENCOUNTER — Other Ambulatory Visit: Payer: Self-pay | Admitting: Family Medicine

## 2023-06-05 ENCOUNTER — Other Ambulatory Visit (HOSPITAL_COMMUNITY): Payer: Self-pay

## 2023-06-15 ENCOUNTER — Other Ambulatory Visit: Payer: Self-pay | Admitting: Family Medicine

## 2023-06-15 NOTE — Telephone Encounter (Signed)
 Requesting: Adderall 20 mg Contract: 06/30/2022 UDS: 06/30/2022 Last Visit: 12/19/2022 Next Visit: 06/19/2023 Last Refill: 05/14/2023  Please Advise

## 2023-06-16 ENCOUNTER — Other Ambulatory Visit: Payer: Self-pay | Admitting: Family Medicine

## 2023-06-16 DIAGNOSIS — Z0000 Encounter for general adult medical examination without abnormal findings: Secondary | ICD-10-CM

## 2023-06-16 DIAGNOSIS — E785 Hyperlipidemia, unspecified: Secondary | ICD-10-CM

## 2023-06-16 DIAGNOSIS — R739 Hyperglycemia, unspecified: Secondary | ICD-10-CM

## 2023-06-18 NOTE — Assessment & Plan Note (Signed)
 Doing well on current meds.

## 2023-06-18 NOTE — Assessment & Plan Note (Signed)
 hgba1c acceptable, minimize simple carbs. Increase exercise as tolerated.

## 2023-06-18 NOTE — Assessment & Plan Note (Signed)
 Encourage heart healthy diet such as MIND or DASH diet, increase exercise, avoid trans fats, simple carbohydrates and processed foods, consider a krill or fish or flaxseed oil cap daily.

## 2023-06-18 NOTE — Assessment & Plan Note (Signed)
 Supplement and monitor

## 2023-06-18 NOTE — Assessment & Plan Note (Signed)
Doing well on Lexapro 5 and Wellbutrin

## 2023-06-18 NOTE — Assessment & Plan Note (Signed)
Following with cardiology and tolerating Coumadin

## 2023-06-19 ENCOUNTER — Ambulatory Visit: Payer: PRIVATE HEALTH INSURANCE | Admitting: Family Medicine

## 2023-06-19 VITALS — BP 138/78 | HR 72 | Temp 97.9°F | Resp 18 | Ht 63.0 in | Wt 168.4 lb

## 2023-06-19 DIAGNOSIS — F419 Anxiety disorder, unspecified: Secondary | ICD-10-CM

## 2023-06-19 DIAGNOSIS — M2550 Pain in unspecified joint: Secondary | ICD-10-CM

## 2023-06-19 DIAGNOSIS — E785 Hyperlipidemia, unspecified: Secondary | ICD-10-CM

## 2023-06-19 DIAGNOSIS — R739 Hyperglycemia, unspecified: Secondary | ICD-10-CM | POA: Diagnosis not present

## 2023-06-19 DIAGNOSIS — E559 Vitamin D deficiency, unspecified: Secondary | ICD-10-CM

## 2023-06-19 DIAGNOSIS — F909 Attention-deficit hyperactivity disorder, unspecified type: Secondary | ICD-10-CM

## 2023-06-19 DIAGNOSIS — F32A Depression, unspecified: Secondary | ICD-10-CM

## 2023-06-19 DIAGNOSIS — Z952 Presence of prosthetic heart valve: Secondary | ICD-10-CM | POA: Diagnosis not present

## 2023-06-19 MED ORDER — ZEPBOUND 15 MG/0.5ML ~~LOC~~ SOAJ: 15.0000 mg | SUBCUTANEOUS | 5 refills | Status: DC
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
## 2023-06-29 ENCOUNTER — Encounter: Payer: Self-pay | Admitting: Family Medicine

## 2023-07-03 NOTE — Addendum Note (Signed)
 Addended by: Aahan Marques C on: 07/03/2023 01:47 PM   Modules accepted: Orders

## 2023-07-03 NOTE — Addendum Note (Signed)
 Addended by: Dasean Brow C on: 07/03/2023 01:13 PM   Modules accepted: Orders

## 2023-07-19 ENCOUNTER — Other Ambulatory Visit: Payer: Self-pay | Admitting: Family Medicine

## 2023-07-19 ENCOUNTER — Other Ambulatory Visit: Payer: Self-pay | Admitting: *Deleted

## 2023-07-19 DIAGNOSIS — Z952 Presence of prosthetic heart valve: Secondary | ICD-10-CM

## 2023-07-19 DIAGNOSIS — M2550 Pain in unspecified joint: Secondary | ICD-10-CM

## 2023-07-19 DIAGNOSIS — R739 Hyperglycemia, unspecified: Secondary | ICD-10-CM

## 2023-07-19 DIAGNOSIS — F419 Anxiety disorder, unspecified: Secondary | ICD-10-CM

## 2023-07-19 DIAGNOSIS — E785 Hyperlipidemia, unspecified: Secondary | ICD-10-CM

## 2023-07-19 DIAGNOSIS — E559 Vitamin D deficiency, unspecified: Secondary | ICD-10-CM

## 2023-07-19 NOTE — Telephone Encounter (Signed)
 Requesting: Adderall 20mg   Contract: 06/30/23 UDS: 06/30/22 Last Visit: 06/19/23 Next Visit: 10/26/23 Last Refill: 06/15/23 #90 and 0RF   Please Advise

## 2023-07-20 ENCOUNTER — Other Ambulatory Visit (HOSPITAL_COMMUNITY): Payer: Self-pay

## 2023-07-20 ENCOUNTER — Telehealth: Payer: Self-pay

## 2023-07-20 NOTE — Telephone Encounter (Signed)
 Pharmacy Patient Advocate Encounter   Received notification from Patient Pharmacy that prior authorization for Zepbound  15 is required/requested.   Insurance verification completed.   The patient is insured through [employer] .   Per test claim: PA required; PA submitted to above mentioned insurance via CoverMyMeds Key/confirmation #[id number] [employer] Status is pending Insurance prefers

## 2023-07-27 ENCOUNTER — Other Ambulatory Visit (HOSPITAL_COMMUNITY): Payer: Self-pay

## 2023-07-27 NOTE — Telephone Encounter (Signed)
 Pharmacy Patient Advocate Encounter  Received notification from [facility] that Prior Authorization for Zepbound  15 has been APPROVED from 07/27/23 to 01/23/24. Ran test claim, Copay is $24.99. This test claim was processed through [facility]- copay amounts may vary at other pharmacies due to pharmacy/plan contracts, or as the patient moves through the different stages of their insurance plan.

## 2023-07-27 NOTE — Telephone Encounter (Signed)
Patient was advised via MyChart. 

## 2023-07-27 NOTE — Telephone Encounter (Signed)
 Patient is needing a call a back regarding her zepbound  medication she needs a pa

## 2023-08-02 ENCOUNTER — Other Ambulatory Visit: Payer: Self-pay | Admitting: Family Medicine

## 2023-08-04 LAB — LIPID PANEL
Chol/HDL Ratio: 2.9 ratio (ref 0.0–4.4)
Cholesterol, Total: 149 mg/dL (ref 100–199)
HDL: 51 mg/dL (ref >39)
LDL Chol Calc (NIH): 82 mg/dL (ref 0–99)
Triglycerides: 85 mg/dL (ref 0–149)
VLDL Cholesterol Cal: 16 mg/dL (ref 5–40)

## 2023-08-04 LAB — VITAMIN D 25 HYDROXY (VIT D DEFICIENCY, FRACTURES): Vit D, 25-Hydroxy: 50 ng/mL (ref 30.0–100.0)

## 2023-08-04 LAB — CBC WITH DIFFERENTIAL/PLATELET
Basophils Absolute: 0.1 10*3/uL (ref 0.0–0.2)
Basos: 1 %
EOS (ABSOLUTE): 0.2 10*3/uL (ref 0.0–0.4)
Eos: 3 %
Hematocrit: 38.3 % (ref 34.0–46.6)
Hemoglobin: 13.2 g/dL (ref 11.1–15.9)
Immature Grans (Abs): 0 10*3/uL (ref 0.0–0.1)
Immature Granulocytes: 0 %
Lymphocytes Absolute: 2 10*3/uL (ref 0.7–3.1)
Lymphs: 32 %
MCH: 32.9 pg (ref 26.6–33.0)
MCHC: 34.5 g/dL (ref 31.5–35.7)
MCV: 96 fL (ref 79–97)
Monocytes Absolute: 0.5 10*3/uL (ref 0.1–0.9)
Monocytes: 8 %
Neutrophils Absolute: 3.5 10*3/uL (ref 1.4–7.0)
Neutrophils: 56 %
Platelets: 294 10*3/uL (ref 150–450)
RBC: 4.01 x10E6/uL (ref 3.77–5.28)
RDW: 12.3 % (ref 11.7–15.4)
WBC: 6.2 10*3/uL (ref 3.4–10.8)

## 2023-08-04 LAB — COMPREHENSIVE METABOLIC PANEL WITH GFR
ALT: 25 IU/L (ref 0–32)
AST: 30 IU/L (ref 0–40)
Albumin: 4.8 g/dL (ref 3.8–4.9)
Alkaline Phosphatase: 59 IU/L (ref 44–121)
BUN/Creatinine Ratio: 19 (ref 9–23)
BUN: 16 mg/dL (ref 6–24)
Bilirubin Total: 0.7 mg/dL (ref 0.0–1.2)
CO2: 24 mmol/L (ref 20–29)
Calcium: 9.8 mg/dL (ref 8.7–10.2)
Chloride: 101 mmol/L (ref 96–106)
Creatinine, Ser: 0.83 mg/dL (ref 0.57–1.00)
Globulin, Total: 2.3 g/dL (ref 1.5–4.5)
Glucose: 84 mg/dL (ref 70–99)
Potassium: 4.3 mmol/L (ref 3.5–5.2)
Sodium: 138 mmol/L (ref 134–144)
Total Protein: 7.1 g/dL (ref 6.0–8.5)
eGFR: 84 mL/min/{1.73_m2} (ref >59)

## 2023-08-04 LAB — HEMOGLOBIN A1C
Est. average glucose Bld gHb Est-mCnc: 97 mg/dL
Hgb A1c MFr Bld: 5 % (ref 4.8–5.6)

## 2023-08-04 LAB — CORTISOL: Cortisol: 10.7 ug/dL (ref 6.2–19.4)

## 2023-08-04 LAB — ANA: Anti Nuclear Antibody (ANA): NEGATIVE

## 2023-08-04 LAB — TSH: TSH: 2.69 u[IU]/mL (ref 0.450–4.500)

## 2023-08-07 ENCOUNTER — Ambulatory Visit: Payer: Self-pay | Admitting: Family Medicine

## 2023-08-16 ENCOUNTER — Other Ambulatory Visit: Payer: Self-pay | Admitting: Family Medicine

## 2023-08-16 MED ORDER — AMPHETAMINE-DEXTROAMPHETAMINE 20 MG PO TABS: ORAL_TABLET | ORAL | 0 refills | Status: DC

## 2023-08-16 NOTE — Telephone Encounter (Signed)
 Requesting: Adderall 20mg   Contract: 07/10/23 UDS: 06/30/22 Last Visit: 06/19/23 Next Visit: 10/26/23 Last Refill: 07/19/23 #90 and 0RF   Please Advise

## 2023-08-16 NOTE — Telephone Encounter (Signed)
 Copied from CRM [id number]. Topic: Clinical - Medication Refill >> Aug 16, 2023  8:34 AM Alyse July wrote: Medication: amphetamine -dextroamphetamine  (ADDERALL) 20 MG tablet  Has the patient contacted their pharmacy? Yes  This is the patient's preferred pharmacy:  [facility] - [place], [place] - [address] [place] [address] Phone: [phone] Fax: [phone]  Is this the correct pharmacy for this prescription? Yes If no, delete pharmacy and type the correct one.   Has the prescription been filled recently? No  Is the patient out of the medication? No  Has the patient been seen for an appointment in the last year OR does the patient have an upcoming appointment? Yes  Can we respond through MyChart? Yes  Agent: Please be advised that Rx refills may take up to 3 business days. We ask that you follow-up with your pharmacy.

## 2023-08-21 ENCOUNTER — Other Ambulatory Visit: Payer: Self-pay | Admitting: Family Medicine

## 2023-08-21 ENCOUNTER — Telehealth: Payer: Self-pay | Admitting: Family Medicine

## 2023-08-21 DIAGNOSIS — Z79899 Other long term (current) drug therapy: Secondary | ICD-10-CM

## 2023-08-21 DIAGNOSIS — F909 Attention-deficit hyperactivity disorder, unspecified type: Secondary | ICD-10-CM

## 2023-08-21 NOTE — Telephone Encounter (Signed)
 Pt need lab orders for 6/13

## 2023-08-21 NOTE — Telephone Encounter (Signed)
 Order in-UDS

## 2023-08-25 ENCOUNTER — Other Ambulatory Visit: Payer: PRIVATE HEALTH INSURANCE

## 2023-08-25 DIAGNOSIS — F909 Attention-deficit hyperactivity disorder, unspecified type: Secondary | ICD-10-CM

## 2023-08-25 DIAGNOSIS — Z79899 Other long term (current) drug therapy: Secondary | ICD-10-CM

## 2023-08-27 LAB — DM TEMPLATE

## 2023-08-28 ENCOUNTER — Ambulatory Visit: Payer: Self-pay | Admitting: Family Medicine

## 2023-08-28 LAB — DRUG MONITORING PANEL 376104, URINE
Barbiturates: NEGATIVE ng/mL (ref <300)
Benzodiazepines: NEGATIVE ng/mL (ref <100)
Cocaine Metabolite: NEGATIVE ng/mL (ref <150)
Desmethyltramadol: NEGATIVE ng/mL (ref <100)
Methamphetamine: NEGATIVE ng/mL (ref <250)
Opiates: NEGATIVE ng/mL (ref <100)
Oxycodone: NEGATIVE ng/mL (ref <100)
Tramadol Comments: >15000 ng/mL — ABNORMAL HIGH (ref <250)
Tramadol: NEGATIVE ng/mL (ref <100)
Tramadol: POSITIVE ng/mL — AB (ref <500)
medMATCH Summary: NEGATIVE ng/mL (ref <100)

## 2023-08-28 LAB — DM TEMPLATE

## 2023-09-11 NOTE — Progress Notes (Signed)
 Pharmacy note reviewed and I agree with the plan of care.    EJA

## 2023-09-18 ENCOUNTER — Other Ambulatory Visit: Payer: Self-pay | Admitting: Family Medicine

## 2023-09-18 DIAGNOSIS — E785 Hyperlipidemia, unspecified: Secondary | ICD-10-CM

## 2023-09-18 DIAGNOSIS — R739 Hyperglycemia, unspecified: Secondary | ICD-10-CM

## 2023-09-18 DIAGNOSIS — Z0000 Encounter for general adult medical examination without abnormal findings: Secondary | ICD-10-CM

## 2023-10-14 ENCOUNTER — Other Ambulatory Visit: Payer: Self-pay | Admitting: Family Medicine

## 2023-10-16 NOTE — Telephone Encounter (Signed)
 Last refilled over a year ago for only 6 months worth of medication. Please review.

## 2023-10-19 ENCOUNTER — Other Ambulatory Visit: Payer: Self-pay | Admitting: Family Medicine

## 2023-10-19 NOTE — Telephone Encounter (Signed)
 Requesting: Adderall 20 MG Contract: 06/19/23 UDS: 08/25/23 Last Visit: 06/19/2023 Next Visit: 10/26/2023 Last Refill: 09/19/23  Please Advise

## 2023-10-23 NOTE — Assessment & Plan Note (Signed)
 hgba1c acceptable, minimize simple carbs. Increase exercise as tolerated.

## 2023-10-23 NOTE — Assessment & Plan Note (Signed)
 Supplement and monitor

## 2023-10-23 NOTE — Assessment & Plan Note (Signed)
 Doing well on current meds.

## 2023-10-23 NOTE — Assessment & Plan Note (Addendum)
 Patient encouraged to maintain heart healthy diet, regular exercise, adequate sleep. Consider daily probiotics. Take medications as prescribe. MGM 01/2023 at [facility] OB pap was 2023 [facility] OB Colonoscopy 2019 repeat in Nov 2026 Labs ordered and reviewed Given and reviewed copy of ACP documents from [employer] and encouraged to complete and return

## 2023-10-23 NOTE — Assessment & Plan Note (Signed)
 Encourage heart healthy diet such as MIND or DASH diet, increase exercise, avoid trans fats, simple carbohydrates and processed foods, consider a krill or fish or flaxseed oil cap daily.

## 2023-10-26 ENCOUNTER — Ambulatory Visit (INDEPENDENT_AMBULATORY_CARE_PROVIDER_SITE_OTHER): Payer: PRIVATE HEALTH INSURANCE | Admitting: Family Medicine

## 2023-10-26 ENCOUNTER — Ambulatory Visit: Payer: Self-pay | Admitting: Family Medicine

## 2023-10-26 VITALS — BP 130/72 | HR 76 | Resp 16 | Ht 63.0 in | Wt 157.4 lb

## 2023-10-26 DIAGNOSIS — F909 Attention-deficit hyperactivity disorder, unspecified type: Secondary | ICD-10-CM

## 2023-10-26 DIAGNOSIS — E559 Vitamin D deficiency, unspecified: Secondary | ICD-10-CM | POA: Diagnosis not present

## 2023-10-26 DIAGNOSIS — R739 Hyperglycemia, unspecified: Secondary | ICD-10-CM

## 2023-10-26 DIAGNOSIS — E785 Hyperlipidemia, unspecified: Secondary | ICD-10-CM

## 2023-10-26 DIAGNOSIS — Z0000 Encounter for general adult medical examination without abnormal findings: Secondary | ICD-10-CM | POA: Diagnosis not present

## 2023-10-26 DIAGNOSIS — Z1211 Encounter for screening for malignant neoplasm of colon: Secondary | ICD-10-CM

## 2023-10-26 LAB — LIPID PANEL
Cholesterol: 152 mg/dL (ref 0–200)
HDL: 45.2 mg/dL (ref >39.00)
LDL Cholesterol: 88 mg/dL (ref 0–99)
NonHDL: 106.92
Total CHOL/HDL Ratio: 3
Triglycerides: 97 mg/dL (ref 0.0–149.0)
VLDL: 19.4 mg/dL (ref 0.0–40.0)

## 2023-10-26 LAB — COMPREHENSIVE METABOLIC PANEL WITH GFR
ALT: 25 U/L (ref 0–35)
AST: 28 U/L (ref 0–37)
Albumin: 4.7 g/dL (ref 3.5–5.2)
Alkaline Phosphatase: 51 U/L (ref 39–117)
BUN: 15 mg/dL (ref 6–23)
CO2: 30 meq/L (ref 19–32)
Calcium: 9.8 mg/dL (ref 8.4–10.5)
Chloride: 103 meq/L (ref 96–112)
Creatinine, Ser: 0.88 mg/dL (ref 0.40–1.20)
GFR: 74.66 mL/min (ref >60.00)
Glucose, Bld: 80 mg/dL (ref 70–99)
Potassium: 5 meq/L (ref 3.5–5.1)
Sodium: 141 meq/L (ref 135–145)
Total Bilirubin: 0.5 mg/dL (ref 0.2–1.2)
Total Protein: 7.1 g/dL (ref 6.0–8.3)

## 2023-10-26 LAB — CBC WITH DIFFERENTIAL/PLATELET
Basophils Absolute: 0.1 K/uL (ref 0.0–0.1)
Basophils Relative: 1.2 % (ref 0.0–3.0)
Eosinophils Absolute: 0.2 K/uL (ref 0.0–0.7)
Eosinophils Relative: 3.4 % (ref 0.0–5.0)
HCT: 40.1 % (ref 36.0–46.0)
Hemoglobin: 13.5 g/dL (ref 12.0–15.0)
Lymphocytes Relative: 34.6 % (ref 12.0–46.0)
Lymphs Abs: 2.1 K/uL (ref 0.7–4.0)
MCHC: 33.8 g/dL (ref 30.0–36.0)
MCV: 94.9 fl (ref 78.0–100.0)
Monocytes Absolute: 0.5 K/uL (ref 0.1–1.0)
Monocytes Relative: 8.4 % (ref 3.0–12.0)
Neutro Abs: 3.1 K/uL (ref 1.4–7.7)
Neutrophils Relative %: 52.4 % (ref 43.0–77.0)
Platelets: 279 K/uL (ref 150.0–400.0)
RBC: 4.22 Mil/uL (ref 3.87–5.11)
RDW: 13 % (ref 11.5–15.5)
WBC: 5.9 K/uL (ref 4.0–10.5)

## 2023-10-26 LAB — VITAMIN D 25 HYDROXY (VIT D DEFICIENCY, FRACTURES): VITD: 43.15 ng/mL (ref 30.00–100.00)

## 2023-10-26 LAB — TSH: TSH: 2.22 u[IU]/mL (ref 0.35–5.50)

## 2023-10-26 MED ORDER — AMPHETAMINE-DEXTROAMPHETAMINE 20 MG PO TABS: 20.0000 mg | ORAL_TABLET | Freq: Three times a day (TID) | ORAL | 0 refills | Status: DC

## 2023-10-26 NOTE — Patient Instructions (Signed)
 Preventive Care 54-54 Years Old, Female  Preventive care refers to lifestyle choices and visits with your health care provider that can promote health and wellness. Preventive care visits are also called wellness exams.  What can I expect for my preventive care visit?  Counseling  Your health care provider may ask you questions about your:  Medical history, including:  Past medical problems.  Family medical history.  Pregnancy history.  Current health, including:  Menstrual cycle.  Method of birth control.  Emotional well-being.  Home life and relationship well-being.  Sexual activity and sexual health.  Lifestyle, including:  Alcohol, nicotine or tobacco, and drug use.  Access to firearms.  Diet, exercise, and sleep habits.  Work and work Astronomer.  Sunscreen use.  Safety issues such as seatbelt and bike helmet use.  Physical exam  Your health care provider will check your:  Height and weight. These may be used to calculate your BMI (body mass index). BMI is a measurement that tells if you are at a healthy weight.  Waist circumference. This measures the distance around your waistline. This measurement also tells if you are at a healthy weight and may help predict your risk of certain diseases, such as type 2 diabetes and high blood pressure.  Heart rate and blood pressure.  Body temperature.  Skin for abnormal spots.  What immunizations do I need?    Vaccines are usually given at various ages, according to a schedule. Your health care provider will recommend vaccines for you based on your age, medical history, and lifestyle or other factors, such as travel or where you work.  What tests do I need?  Screening  Your health care provider may recommend screening tests for certain conditions. This may include:  Lipid and cholesterol levels.  Diabetes screening. This is done by checking your blood sugar (glucose) after you have not eaten for a while (fasting).  Pelvic exam and Pap test.  Hepatitis B test.  Hepatitis C  test.  HIV (human immunodeficiency virus) test.  STI (sexually transmitted infection) testing, if you are at risk.  Lung cancer screening.  Colorectal cancer screening.  Mammogram. Talk with your health care provider about when you should start having regular mammograms. This may depend on whether you have a family history of breast cancer.  BRCA-related cancer screening. This may be done if you have a family history of breast, ovarian, tubal, or peritoneal cancers.  Bone density scan. This is done to screen for osteoporosis.  Talk with your health care provider about your test results, treatment options, and if necessary, the need for more tests.  Follow these instructions at home:  Eating and drinking    Eat a diet that includes fresh fruits and vegetables, whole grains, lean protein, and low-fat dairy products.  Take vitamin and mineral supplements as recommended by your health care provider.  Do not drink alcohol if:  Your health care provider tells you not to drink.  You are pregnant, may be pregnant, or are planning to become pregnant.  If you drink alcohol:  Limit how much you have to 0-1 drink a day.  Know how much alcohol is in your drink. In the [place] one drink equals one 12 oz bottle of beer (355 mL), one 5 oz glass of wine (148 mL), or one 1 oz glass of hard liquor (44 mL).  Lifestyle  Brush your teeth every morning and night with fluoride toothpaste. Floss one time each day.  Exercise for at least  30 minutes 5 or more days each week.  Do not use any products that contain nicotine or tobacco. These products include cigarettes, chewing tobacco, and vaping devices, such as e-cigarettes. If you need help quitting, ask your health care provider.  Do not use drugs.  If you are sexually active, practice safe sex. Use a condom or other form of protection to prevent STIs.  If you do not wish to become pregnant, use a form of birth control. If you plan to become pregnant, see your health care provider for a  prepregnancy visit.  Take aspirin only as told by your health care provider. Make sure that you understand how much to take and what form to take. Work with your health care provider to find out whether it is safe and beneficial for you to take aspirin daily.  Find healthy ways to manage stress, such as:  Meditation, yoga, or listening to music.  Journaling.  Talking to a trusted person.  Spending time with friends and family.  Minimize exposure to UV radiation to reduce your risk of skin cancer.  Safety  Always wear your seat belt while driving or riding in a vehicle.  Do not drive:  If you have been drinking alcohol. Do not ride with someone who has been drinking.  When you are tired or distracted.  While texting.  If you have been using any mind-altering substances or drugs.  Wear a helmet and other protective equipment during sports activities.  If you have firearms in your house, make sure you follow all gun safety procedures.  Seek help if you have been physically or sexually abused.  What's next?  Visit your health care provider once a year for an annual wellness visit.  Ask your health care provider how often you should have your eyes and teeth checked.  Stay up to date on all vaccines.  This information is not intended to replace advice given to you by your health care provider. Make sure you discuss any questions you have with your health care provider.  Document Revised: 08/26/2020 Document Reviewed: 08/26/2020  Elsevier Patient Education  2024 [employer].
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
## 2023-10-27 NOTE — Progress Notes (Signed)
 Patient reviewed via MyChart.

## 2023-12-11 ENCOUNTER — Other Ambulatory Visit: Payer: Self-pay | Admitting: Family Medicine

## 2023-12-11 DIAGNOSIS — Z0000 Encounter for general adult medical examination without abnormal findings: Secondary | ICD-10-CM

## 2023-12-11 DIAGNOSIS — R739 Hyperglycemia, unspecified: Secondary | ICD-10-CM

## 2023-12-11 DIAGNOSIS — E785 Hyperlipidemia, unspecified: Secondary | ICD-10-CM

## 2023-12-25 ENCOUNTER — Telehealth: Payer: Self-pay | Admitting: *Deleted

## 2023-12-25 NOTE — Telephone Encounter (Signed)
 Spoke with [facility] and they needed dx code for vit d.  Info given.

## 2023-12-25 NOTE — Telephone Encounter (Signed)
 Copied from CRM [id number]. Topic: General - Billing Inquiry >> Dec 22, 2023  4:27 PM Alfonso HERO wrote: Reason for CRM: Delon from [facility] called to get additional Dx codes. Please callback to [phone] ref# [id number]

## 2024-01-21 ENCOUNTER — Other Ambulatory Visit: Payer: Self-pay | Admitting: Family Medicine

## 2024-01-22 NOTE — Telephone Encounter (Signed)
 Requesting: adderall Contract:08/25/23 UDS:08/25/23 Last Visit:10/26/23 Next Visit:04/01/24 Last Refill:10/26/23  Please Advise

## 2024-02-11 ENCOUNTER — Other Ambulatory Visit: Payer: Self-pay | Admitting: Family Medicine

## 2024-02-13 LAB — HM MAMMOGRAPHY

## 2024-02-27 ENCOUNTER — Other Ambulatory Visit: Payer: Self-pay | Admitting: Family Medicine

## 2024-02-27 NOTE — Telephone Encounter (Signed)
 Requesting: Adderall 20mg   Contract:07/10/23 UDS: 08/25/23 Last Visit: 10/26/23 Next Visit: 04/01/24 Last Refill: see med list   Please Advise

## 2024-04-01 ENCOUNTER — Ambulatory Visit: Payer: PRIVATE HEALTH INSURANCE | Admitting: Family Medicine

## 2024-06-27 ENCOUNTER — Ambulatory Visit: Payer: PRIVATE HEALTH INSURANCE | Admitting: Family Medicine
